# Patient Record
Sex: Male | Born: 1990 | Race: White | Hispanic: No | Marital: Single | Smoking: Current every day smoker
Health system: Southern US, Community
[De-identification: ages and names within clinical notes are randomized; demographics above are authoritative.]

## PROBLEM LIST (undated history)

## (undated) DIAGNOSIS — M51369 Other intervertebral disc degeneration, lumbar region without mention of lumbar back pain or lower extremity pain: Secondary | ICD-10-CM

## (undated) DIAGNOSIS — K259 Gastric ulcer, unspecified as acute or chronic, without hemorrhage or perforation: Secondary | ICD-10-CM

## (undated) DIAGNOSIS — IMO0002 Reserved for concepts with insufficient information to code with codable children: Secondary | ICD-10-CM

## (undated) DIAGNOSIS — C329 Malignant neoplasm of larynx, unspecified: Secondary | ICD-10-CM

## (undated) DIAGNOSIS — M5136 Other intervertebral disc degeneration, lumbar region: Secondary | ICD-10-CM

## (undated) HISTORY — PX: WISDOM TOOTH EXTRACTION: SHX21

---

## 2010-07-15 ENCOUNTER — Emergency Department (HOSPITAL_COMMUNITY)
Admission: EM | Admit: 2010-07-15 | Discharge: 2010-07-15 | Payer: Self-pay | Source: Home / Self Care | Admitting: Emergency Medicine

## 2011-01-24 ENCOUNTER — Emergency Department (HOSPITAL_COMMUNITY): Payer: BC Managed Care – PPO

## 2011-01-24 ENCOUNTER — Emergency Department (HOSPITAL_COMMUNITY)
Admission: EM | Admit: 2011-01-24 | Discharge: 2011-01-25 | Disposition: A | Discharge status: Home / Self Care | Payer: BC Managed Care – PPO | Attending: Emergency Medicine | Admitting: Emergency Medicine

## 2011-01-24 DIAGNOSIS — S91309A Unspecified open wound, unspecified foot, initial encounter: Secondary | ICD-10-CM | POA: Insufficient documentation

## 2011-01-24 DIAGNOSIS — F319 Bipolar disorder, unspecified: Secondary | ICD-10-CM | POA: Insufficient documentation

## 2011-01-24 DIAGNOSIS — M79609 Pain in unspecified limb: Secondary | ICD-10-CM | POA: Insufficient documentation

## 2011-01-24 DIAGNOSIS — W268XXA Contact with other sharp object(s), not elsewhere classified, initial encounter: Secondary | ICD-10-CM | POA: Insufficient documentation

## 2011-12-09 ENCOUNTER — Emergency Department (HOSPITAL_BASED_OUTPATIENT_CLINIC_OR_DEPARTMENT_OTHER)
Admission: EM | Admit: 2011-12-09 | Discharge: 2011-12-09 | Disposition: A | Discharge status: Home / Self Care | Payer: BC Managed Care – PPO | Attending: Emergency Medicine | Admitting: Emergency Medicine

## 2011-12-09 ENCOUNTER — Encounter (HOSPITAL_BASED_OUTPATIENT_CLINIC_OR_DEPARTMENT_OTHER): Payer: Self-pay | Admitting: *Deleted

## 2011-12-09 DIAGNOSIS — K006 Disturbances in tooth eruption: Secondary | ICD-10-CM | POA: Insufficient documentation

## 2011-12-09 DIAGNOSIS — K0889 Other specified disorders of teeth and supporting structures: Secondary | ICD-10-CM

## 2011-12-09 DIAGNOSIS — F17200 Nicotine dependence, unspecified, uncomplicated: Secondary | ICD-10-CM | POA: Insufficient documentation

## 2011-12-09 HISTORY — DX: Reserved for concepts with insufficient information to code with codable children: IMO0002

## 2011-12-09 MED ORDER — HYDROCODONE-ACETAMINOPHEN 5-325 MG PO TABS
ORAL_TABLET | ORAL | Status: AC
  Administered 2011-12-09: 2 via ORAL
  Filled 2011-12-09: qty 2

## 2011-12-09 MED ORDER — HYDROCODONE-ACETAMINOPHEN 5-325 MG PO TABS: 2.0000 | ORAL_TABLET | ORAL | Status: AC | PRN

## 2011-12-09 MED ORDER — HYDROCODONE-ACETAMINOPHEN 5-325 MG PO TABS
2.0000 | ORAL_TABLET | Freq: Once | ORAL | Status: AC
  Administered 2011-12-09: 2 via ORAL

## 2011-12-09 MED ORDER — PENICILLIN V POTASSIUM 500 MG PO TABS: 500.0000 mg | ORAL_TABLET | Freq: Four times a day (QID) | ORAL | Status: AC

## 2011-12-09 NOTE — ED Notes (Signed)
rx x 1 given for norco- pt informed of e-prescription for PCN at [facility]- pt has a ride at bedside

## 2011-12-09 NOTE — Discharge Instructions (Signed)
Dental Pain  A tooth ache may be caused by cavities (tooth decay). Cavities expose the nerve of the tooth to air and hot or cold temperatures. It may come from an infection or abscess (also called a boil or furuncle) around your tooth. It is also often caused by dental caries (tooth decay). This causes the pain you are having.  DIAGNOSIS   Your caregiver can diagnose this problem by exam.  TREATMENT   · If caused by an infection, it may be treated with medications which kill germs (antibiotics) and pain medications as prescribed by your caregiver. Take medications as directed.  · Only take over-the-counter or prescription medicines for pain, discomfort, or fever as directed by your caregiver.  · Whether the tooth ache today is caused by infection or dental disease, you should see your dentist as soon as possible for further care.  SEEK MEDICAL CARE IF:  The exam and treatment you received today has been provided on an emergency basis only. This is not a substitute for complete medical or dental care. If your problem worsens or new problems (symptoms) appear, and you are unable to meet with your dentist, call or return to this location.  SEEK IMMEDIATE MEDICAL CARE IF:   · You have a fever.  · You develop redness and swelling of your face, jaw, or neck.  · You are unable to open your mouth.  · You have severe pain uncontrolled by pain medicine.  MAKE SURE YOU:   · Understand these instructions.  · Will watch your condition.  · Will get help right away if you are not doing well or get worse.  Document Released: 06/18/2005 Document Revised: 06/07/2011 Document Reviewed: 02/04/2008  ExitCare® Patient Information ©2012 [employer].

## 2011-12-09 NOTE — ED Provider Notes (Signed)
History     CSN: [id number]  Arrival date & time 12/09/11  1703   First MD Initiated Contact with Patient 12/09/11 1736      Chief Complaint  Patient presents with  . Dental Pain    (Consider location/radiation/quality/duration/timing/severity/associated sxs/prior treatment) Patient is a 21 y.o. male presenting with tooth pain. The history is provided by the patient. No language interpreter was used.  Dental PainThe primary symptoms include mouth pain and oral lesions. The symptoms began 2 days ago. The symptoms are worsening. The symptoms are new. The symptoms occur constantly.  Additional symptoms include: gum swelling and gum tenderness.   Pt complains of pain from wisdom teeth coming in.   Pt reports pain right lower mouth.  No dentist Past Medical History  Diagnosis Date  . Herniated disc     History reviewed. No pertinent past surgical history.  No family history on file.  History  Substance Use Topics  . Smoking status: Current Everyday Smoker  . Smokeless tobacco: Never Used  . Alcohol Use: No      Review of Systems  HENT: Positive for dental problem.   All other systems reviewed and are negative.    Allergies  Wellbutrin  Home Medications   Current Outpatient Rx  Name Route Sig Dispense Refill  . HYDROCODONE-ACETAMINOPHEN 10-325 MG PO TABS Oral Take 1 tablet by mouth every 6 (six) hours as needed. Patient used this medication for pain.      BP 123/80  Pulse 101  Temp(Src) 99 F (37.2 C) (Oral)  Resp 20  Ht 5\' 10"  (1.778 m)  Wt 118 lb (53.524 kg)  BMI 16.93 kg/m2  SpO2 100%  Physical Exam  Nursing note and vitals reviewed. Constitutional: He appears well-developed and well-nourished.  HENT:  Head: Normocephalic and atraumatic.  Nose: Nose normal.  Mouth/Throat: Oropharynx is clear and moist.       Impacted lower wisdom tooth.    Eyes: Conjunctivae and EOM are normal. Pupils are equal, round, and reactive to light.  Neck: Normal range of  motion. Neck supple.  Pulmonary/Chest: Effort normal.  Abdominal: Soft. Bowel sounds are normal.  Neurological: He is alert.  Skin: Skin is warm.  Psychiatric: He has a normal mood and affect.    ED Course  Procedures (including critical care time)  Labs Reviewed - No data to display No results found.   No diagnosis found.    MDM  RX for pcn and hydrocodone.          Lonia Skinner [place], [place] 12/09/11 1757

## 2011-12-09 NOTE — ED Notes (Signed)
Right lower tooth pain x 3 days 

## 2011-12-12 NOTE — ED Provider Notes (Signed)
Medical screening examination/treatment/procedure(s) were performed by non-physician practitioner and as supervising physician I was immediately available for consultation/collaboration.  Kellen Hover, MD 12/12/11 1759 

## 2012-01-01 ENCOUNTER — Emergency Department (HOSPITAL_BASED_OUTPATIENT_CLINIC_OR_DEPARTMENT_OTHER)
Admission: EM | Admit: 2012-01-01 | Discharge: 2012-01-01 | Disposition: A | Discharge status: Home / Self Care | Payer: BC Managed Care – PPO | Attending: Emergency Medicine | Admitting: Emergency Medicine

## 2012-01-01 ENCOUNTER — Encounter (HOSPITAL_BASED_OUTPATIENT_CLINIC_OR_DEPARTMENT_OTHER): Payer: Self-pay

## 2012-01-01 DIAGNOSIS — F17200 Nicotine dependence, unspecified, uncomplicated: Secondary | ICD-10-CM | POA: Insufficient documentation

## 2012-01-01 DIAGNOSIS — M549 Dorsalgia, unspecified: Secondary | ICD-10-CM | POA: Insufficient documentation

## 2012-01-01 MED ORDER — KETOROLAC TROMETHAMINE 60 MG/2ML IM SOLN
60.0000 mg | Freq: Once | INTRAMUSCULAR | Status: AC
  Administered 2012-01-01: 60 mg via INTRAMUSCULAR
  Filled 2012-01-01: qty 2

## 2012-01-01 MED ORDER — HYDROMORPHONE HCL PF 2 MG/ML IJ SOLN
2.0000 mg | Freq: Once | INTRAMUSCULAR | Status: AC
  Administered 2012-01-01: 2 mg via INTRAMUSCULAR
  Filled 2012-01-01: qty 1

## 2012-01-01 MED ORDER — HYDROCODONE-ACETAMINOPHEN 5-325 MG PO TABS: 2.0000 | ORAL_TABLET | ORAL | Status: AC | PRN

## 2012-01-01 NOTE — ED Provider Notes (Signed)
History     CSN: [id number]  Arrival date & time 01/01/12  1750   First MD Initiated Contact with Patient 01/01/12 1901      Chief Complaint  Patient presents with  . Back Pain    (Consider location/radiation/quality/duration/timing/severity/associated sxs/prior treatment) Patient is a 21 y.o. male presenting with back pain. The history is provided by the patient. No language interpreter was used.  Back Pain  This is a recurrent problem. The current episode started yesterday. The problem occurs constantly. The problem has been gradually worsening. The pain is present in the thoracic spine. The quality of the pain is described as shooting and aching. The pain is moderate. He has tried nothing for the symptoms.  Pt complains of pain from a herniated disc in his back.   Pt reports he can not see his Md until friday  Past Medical History  Diagnosis Date  . Herniated disc     History reviewed. No pertinent past surgical history.  History reviewed. No pertinent family history.  History  Substance Use Topics  . Smoking status: Current Everyday Smoker  . Smokeless tobacco: Never Used  . Alcohol Use: No      Review of Systems  Musculoskeletal: Positive for back pain.  All other systems reviewed and are negative.    Allergies  Wellbutrin  Home Medications   Current Outpatient Rx  Name Route Sig Dispense Refill  . PRESCRIPTION MEDICATION Oral Take 1 tablet by mouth daily. Patient uses a corticosteroid tablet that doctor gave to him as a sample.    Marland Kitchen HYDROCODONE-ACETAMINOPHEN 5-325 MG PO TABS Oral Take 2 tablets by mouth every 4 (four) hours as needed for pain. 20 tablet 0    BP 145/96  Pulse 105  Temp 98.4 F (36.9 C) (Oral)  Resp 16  Ht 5\' 10"  (1.778 m)  Wt 120 lb (54.432 kg)  BMI 17.22 kg/m2  SpO2 100%  Physical Exam  Nursing note and vitals reviewed. Constitutional: He is oriented to person, place, and time. He appears well-developed and well-nourished.    HENT:  Head: Normocephalic.  Eyes: Pupils are equal, round, and reactive to light.  Neck: Normal range of motion.  Cardiovascular: Normal rate.   Pulmonary/Chest: Effort normal.  Abdominal: Soft.  Musculoskeletal: Normal range of motion.       Tender mid thoracic spine,    Neurological: He is alert and oriented to person, place, and time. He has normal reflexes.  Skin: Skin is warm.    ED Course  Procedures (including critical care time)  Labs Reviewed - No data to display No results found.   1. Back pain       MDM  Pt given hydrocodone.  i advised pt to see his Md for recheck on Friday as scheduled        Lonia Skinner [place], [place] 01/01/12 1950

## 2012-01-01 NOTE — ED Notes (Signed)
Pt c/o mid back pain. Hx chronic pain

## 2012-01-05 NOTE — ED Provider Notes (Signed)
Medical screening examination/treatment/procedure(s) were performed by non-physician practitioner and as supervising physician I was immediately available for consultation/collaboration.  Leyah Bocchino M Marua Qin, MD 01/05/12 1237 

## 2012-07-24 ENCOUNTER — Other Ambulatory Visit: Payer: Self-pay | Admitting: Family Medicine

## 2012-07-24 DIAGNOSIS — R109 Unspecified abdominal pain: Secondary | ICD-10-CM

## 2012-07-24 DIAGNOSIS — R112 Nausea with vomiting, unspecified: Secondary | ICD-10-CM

## 2012-07-28 ENCOUNTER — Ambulatory Visit
Admission: RE | Admit: 2012-07-28 | Discharge: 2012-07-28 | Disposition: A | Discharge status: Home / Self Care | Payer: BC Managed Care – PPO | Source: Ambulatory Visit | Attending: Family Medicine | Admitting: Family Medicine

## 2012-07-28 DIAGNOSIS — R109 Unspecified abdominal pain: Secondary | ICD-10-CM

## 2012-07-28 DIAGNOSIS — R112 Nausea with vomiting, unspecified: Secondary | ICD-10-CM

## 2012-08-12 ENCOUNTER — Other Ambulatory Visit: Payer: Self-pay | Admitting: Gastroenterology

## 2012-08-12 DIAGNOSIS — R112 Nausea with vomiting, unspecified: Secondary | ICD-10-CM

## 2012-08-18 ENCOUNTER — Ambulatory Visit
Admission: RE | Admit: 2012-08-18 | Discharge: 2012-08-18 | Disposition: A | Discharge status: Home / Self Care | Payer: BC Managed Care – PPO | Source: Ambulatory Visit | Attending: Gastroenterology | Admitting: Gastroenterology

## 2012-08-18 DIAGNOSIS — R112 Nausea with vomiting, unspecified: Secondary | ICD-10-CM

## 2013-09-30 ENCOUNTER — Emergency Department (HOSPITAL_BASED_OUTPATIENT_CLINIC_OR_DEPARTMENT_OTHER): Payer: Worker's Compensation

## 2013-09-30 ENCOUNTER — Encounter (HOSPITAL_BASED_OUTPATIENT_CLINIC_OR_DEPARTMENT_OTHER): Payer: Self-pay | Admitting: Emergency Medicine

## 2013-09-30 ENCOUNTER — Emergency Department (HOSPITAL_BASED_OUTPATIENT_CLINIC_OR_DEPARTMENT_OTHER)
Admission: EM | Admit: 2013-09-30 | Discharge: 2013-09-30 | Disposition: A | Discharge status: Home / Self Care | Payer: Worker's Compensation | Attending: Emergency Medicine | Admitting: Emergency Medicine

## 2013-09-30 DIAGNOSIS — W208XXA Other cause of strike by thrown, projected or falling object, initial encounter: Secondary | ICD-10-CM | POA: Insufficient documentation

## 2013-09-30 DIAGNOSIS — S1093XA Contusion of unspecified part of neck, initial encounter: Secondary | ICD-10-CM

## 2013-09-30 DIAGNOSIS — S060X9A Concussion with loss of consciousness of unspecified duration, initial encounter: Secondary | ICD-10-CM

## 2013-09-30 DIAGNOSIS — Z79899 Other long term (current) drug therapy: Secondary | ICD-10-CM | POA: Insufficient documentation

## 2013-09-30 DIAGNOSIS — Y9389 Activity, other specified: Secondary | ICD-10-CM | POA: Insufficient documentation

## 2013-09-30 DIAGNOSIS — S0083XA Contusion of other part of head, initial encounter: Secondary | ICD-10-CM

## 2013-09-30 DIAGNOSIS — Z8739 Personal history of other diseases of the musculoskeletal system and connective tissue: Secondary | ICD-10-CM | POA: Insufficient documentation

## 2013-09-30 DIAGNOSIS — S060XAA Concussion with loss of consciousness status unknown, initial encounter: Secondary | ICD-10-CM

## 2013-09-30 DIAGNOSIS — Y9269 Other specified industrial and construction area as the place of occurrence of the external cause: Secondary | ICD-10-CM | POA: Insufficient documentation

## 2013-09-30 DIAGNOSIS — S0003XA Contusion of scalp, initial encounter: Secondary | ICD-10-CM | POA: Insufficient documentation

## 2013-09-30 DIAGNOSIS — F17200 Nicotine dependence, unspecified, uncomplicated: Secondary | ICD-10-CM | POA: Insufficient documentation

## 2013-09-30 DIAGNOSIS — S060X0A Concussion without loss of consciousness, initial encounter: Secondary | ICD-10-CM | POA: Insufficient documentation

## 2013-09-30 MED ORDER — ONDANSETRON HCL 4 MG PO TABS: 4.0000 mg | ORAL_TABLET | Freq: Three times a day (TID) | ORAL | Status: DC | PRN

## 2013-09-30 NOTE — Discharge Instructions (Signed)

## 2013-09-30 NOTE — ED Provider Notes (Signed)
CSN: [id number]     Arrival date & time 09/30/13  1343 History   First MD Initiated Contact with Patient 09/30/13 1437     Chief Complaint  Patient presents with  . Head Injury     (Consider location/radiation/quality/duration/timing/severity/associated sxs/prior Treatment) Patient is a 23 y.o. male presenting with head injury.  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 He denies LOC but reports he was seeing all white for a short period. Has had headache and dizziness since then. No vomiting, no blurry vision. No confusion.  Past Medical History  Diagnosis Date  . Herniated disc    Past Surgical History  Procedure Laterality Date  . Wisdom tooth extraction     History reviewed. No pertinent family history. History  Substance Use Topics  . Smoking status: Current Every Day Smoker -- 2.00 packs/day  . Smokeless tobacco: Never Used  . Alcohol Use: No    Review of Systems All other systems reviewed and are negative except as noted in HPI.     Allergies  Wellbutrin  Home Medications   Current Outpatient Rx  Name  Route  Sig  Dispense  Refill  . METHADONE HCL PO   Oral   Take 130 mg by mouth daily.         Marland Kitchen PRESCRIPTION MEDICATION   Oral   Take 1 tablet by mouth daily. Patient uses a corticosteroid tablet that doctor gave to him as a sample.          BP 154/98  Pulse 89  Temp(Src) 98.4 F (36.9 C) (Oral)  Resp 16  Ht 5\' 9"  (1.753 m)  Wt 130 lb (58.968 kg)  BMI 19.19 kg/m2  SpO2 100% Physical Exam  Nursing note and vitals reviewed. Constitutional: He is oriented to person, place, and time. He appears well-developed and well-nourished.  HENT:  Head: Normocephalic and atraumatic.  Eyes: EOM are normal. Pupils are equal, round, and reactive to light.  Neck: Normal range of motion. Neck supple.  Cardiovascular: Normal rate, normal heart sounds and intact distal pulses.    Pulmonary/Chest: Effort normal and breath sounds normal.  Abdominal: Bowel sounds are normal. He exhibits no distension. There is no tenderness.  Musculoskeletal: Normal range of motion. He exhibits no edema and no tenderness.  Neurological: He is alert and oriented to person, place, and time. He has normal strength. No cranial nerve deficit or sensory deficit.  Skin: Skin is warm and dry. No rash noted.  Psychiatric: He has a normal mood and affect.    ED Course  Procedures (including critical care time) Labs Review Labs Reviewed - No data to display Imaging Review Ct Head Wo Contrast  09/30/2013   CLINICAL DATA:  Pain post trauma  EXAM: CT HEAD WITHOUT CONTRAST  TECHNIQUE: Contiguous axial images were obtained from the base of the skull through the vertex without intravenous contrast.  COMPARISON:  None.  FINDINGS: The ventricles are normal in size and configuration. The left lateral ventricle is slightly larger than the right lateral ventricle, an anatomic variant. There is no mass, hemorrhage, extra-axial fluid collection, or midline shift. Gray-white compartments are normal. Bony calvarium appears intact. The mastoid air cells are clear. There is mild mucosal thickening in several ethmoid air cells bilaterally. There is a small retention cyst in the superior right maxillary antrum.  IMPRESSION: Mild paranasal sinus disease.  Study otherwise unremarkable.  Electronically Signed   By: Lowella Grip M.D.   On: 09/30/2013 14:32     EKG Interpretation None      MDM   Final diagnoses:  Concussion    CT neg, given head injury/concussion precautions.     Charles B. Karle Starch, MD 09/30/13 1450

## 2013-09-30 NOTE — ED Notes (Addendum)
Pt sts electric drill fell 20 feet striking Left head, states episode of "whiteness", then 2 hours later, started having shaking hands. Pt sts headache, abrasion noted to left head. Also reports painful to close jaw. Also reports dizziness and nausea.

## 2013-09-30 NOTE — ED Notes (Signed)
Patient has bruise and a hematoma to the left side of his head due to being hit by a falling drill. Appx 2 inches in diameter.  Patient c/o a headache.

## 2014-11-19 ENCOUNTER — Emergency Department (HOSPITAL_COMMUNITY)
Admission: EM | Admit: 2014-11-19 | Discharge: 2014-11-19 | Disposition: A | Discharge status: Home / Self Care | Payer: BLUE CROSS/BLUE SHIELD | Attending: Emergency Medicine | Admitting: Emergency Medicine

## 2014-11-19 ENCOUNTER — Encounter (HOSPITAL_COMMUNITY): Payer: Self-pay | Admitting: Emergency Medicine

## 2014-11-19 DIAGNOSIS — Z8719 Personal history of other diseases of the digestive system: Secondary | ICD-10-CM | POA: Diagnosis not present

## 2014-11-19 DIAGNOSIS — S0591XA Unspecified injury of right eye and orbit, initial encounter: Secondary | ICD-10-CM | POA: Diagnosis present

## 2014-11-19 DIAGNOSIS — X58XXXA Exposure to other specified factors, initial encounter: Secondary | ICD-10-CM | POA: Diagnosis not present

## 2014-11-19 DIAGNOSIS — Z79891 Long term (current) use of opiate analgesic: Secondary | ICD-10-CM | POA: Insufficient documentation

## 2014-11-19 DIAGNOSIS — Y939 Activity, unspecified: Secondary | ICD-10-CM | POA: Insufficient documentation

## 2014-11-19 DIAGNOSIS — G8929 Other chronic pain: Secondary | ICD-10-CM | POA: Insufficient documentation

## 2014-11-19 DIAGNOSIS — Z720 Tobacco use: Secondary | ICD-10-CM | POA: Diagnosis not present

## 2014-11-19 DIAGNOSIS — S0501XA Injury of conjunctiva and corneal abrasion without foreign body, right eye, initial encounter: Secondary | ICD-10-CM | POA: Insufficient documentation

## 2014-11-19 DIAGNOSIS — Y929 Unspecified place or not applicable: Secondary | ICD-10-CM | POA: Insufficient documentation

## 2014-11-19 DIAGNOSIS — Y999 Unspecified external cause status: Secondary | ICD-10-CM | POA: Insufficient documentation

## 2014-11-19 MED ORDER — ERYTHROMYCIN 5 MG/GM OP OINT: TOPICAL_OINTMENT | OPHTHALMIC | Status: DC

## 2014-11-19 MED ORDER — FLUORESCEIN SODIUM 1 MG OP STRP
1.0000 | ORAL_STRIP | Freq: Once | OPHTHALMIC | Status: AC
  Administered 2014-11-19: 1 via OPHTHALMIC

## 2014-11-19 MED ORDER — FLUORESCEIN SODIUM 1 MG OP STRP
ORAL_STRIP | OPHTHALMIC | Status: AC
  Filled 2014-11-19: qty 1

## 2014-11-19 MED ORDER — TETRACAINE HCL 0.5 % OP SOLN
2.0000 [drp] | Freq: Once | OPHTHALMIC | Status: AC
  Administered 2014-11-19: 2 [drp] via OPHTHALMIC

## 2014-11-19 NOTE — ED Provider Notes (Signed)
TIME SEEN: 5:35 AM  CHIEF COMPLAINT: Right eye pain  HPI: Pt is a 24 y.o. male with history of chronic pain on methadone who presents to the emergency department with right eye pain that started yesterday at 9 PM after he was rubbing his eye. 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 He felt like he had something on his hand when he rubbed his eye last night. Is having some blurry vision and pain is worse with the light. Does not wear glasses or contacts. No other injury to the face or eye.  ROS: See HPI Constitutional: no fever  Eyes: no drainage  ENT: no runny nose   Cardiovascular:  no chest pain  Resp: no SOB  GI: no vomiting GU: no dysuria Integumentary: no rash  Allergy: no hives  Musculoskeletal: no leg swelling  Neurological: no slurred speech ROS otherwise negative  PAST MEDICAL HISTORY/PAST SURGICAL HISTORY:  Past Medical History  Diagnosis Date  . Herniated disc     MEDICATIONS:  Prior to Admission medications   Medication Sig Start Date End Date Taking? Authorizing Provider  METHADONE HCL PO Take 130 mg by mouth daily.    Historical Provider, MD  ondansetron (ZOFRAN) 4 MG tablet Take 1 tablet (4 mg total) by mouth every 8 (eight) hours as needed for nausea or vomiting. 09/30/13   Calvert Cantor, MD  PRESCRIPTION MEDICATION Take 1 tablet by mouth daily. Patient uses a corticosteroid tablet that doctor gave to him as a sample.    Historical Provider, MD    ALLERGIES:  Allergies  Allergen Reactions  . Wellbutrin [Bupropion] Swelling    SOCIAL HISTORY:  History  Substance Use Topics  . Smoking status: Current Every Day Smoker -- 2.00 packs/day  . Smokeless tobacco: Never Used  . Alcohol Use: No    FAMILY HISTORY: No family history on file.  EXAM: BP 126/75 mmHg  Pulse 65  Temp(Src) 97.9 F (36.6 C) (Oral)  Resp 16  Ht 5\' 9"  (1.753 m)  Wt 150 lb (68.04 kg)  BMI 22.14 kg/m2  SpO2 98% CONSTITUTIONAL: Alert  and oriented and responds appropriately to questions. Well-appearing; well-nourished HEAD: Normocephalic EYES: Conjunctivae clear, PERRL, patient has photophobia but when given tetracaine his blurry vision and photophobia resolved, patient has 3 punctate corneal abrasions to the inferior aspect of the right eye redness with no foreign body appreciated, extraocular movements intact, no hyphema, no subconjunctival hemorrhage, globe intact ENT: normal nose; no rhinorrhea; moist mucous membranes; pharynx without lesions noted NECK: Supple, no meningismus, no LAD  CARD: RRR; S1 and S2 appreciated; no murmurs, no clicks, no rubs, no gallops RESP: Normal chest excursion without splinting or tachypnea; breath sounds clear and equal bilaterally; no wheezes, no rhonchi, no rales, no hypoxia or respiratory distress, speaking full sentences ABD/GI: Normal bowel sounds; non-distended; soft, non-tender, no rebound, no guarding, no peritoneal signs BACK:  The back appears normal and is non-tender to palpation, there is no CVA tenderness EXT: Normal ROM in all joints; non-tender to palpation; no edema; normal capillary refill; no cyanosis, no calf tenderness or swelling    SKIN: Normal color for age and race; warm NEURO: Moves all extremities equally, sensation to light touch intact diffusely, cranial nerves II through XII intact PSYCH: The patient's mood and manner are appropriate. Grooming and personal hygiene are appropriate.  MEDICAL DECISION MAKING: Patient here with corneal abrasion. No sign of globe injury otherwise. Vision improved after tetracaine. He is on  methadone chronically. Will not discharge home with further narcotics. We'll discharge him with erythromycin ointment and ophthalmology follow up information. Discussed return precautions. He verbalizes understanding and is comfortable with plan.     Johnson, DO 11/19/14 [phone]

## 2014-11-19 NOTE — ED Notes (Signed)
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 Swelling/throbbing per patient. Light sensitive.

## 2014-11-19 NOTE — Discharge Instructions (Signed)
Corneal Abrasion °The cornea is the clear covering at the front and center of the eye. When looking at the colored portion of the eye (iris), you are looking through the cornea. This very thin tissue is made up of many layers. The surface layer is a single layer of cells (corneal epithelium) and is one of the most sensitive tissues in the body. If a scratch or injury causes the corneal epithelium to come off, it is called a corneal abrasion. If the injury extends to the tissues below the epithelium, the condition is called a corneal ulcer. °CAUSES  °· Scratches. °· Trauma. °· Foreign body in the eye. °Some people have recurrences of abrasions in the area of the original injury even after it has healed (recurrent erosion syndrome). Recurrent erosion syndrome generally improves and goes away with time. °SYMPTOMS  °· Eye pain. °· Difficulty or inability to keep the injured eye open. °· The eye becomes very sensitive to light. °· Recurrent erosions tend to happen suddenly, first thing in the morning, usually after waking up and opening the eye. °DIAGNOSIS  °Your health care provider can diagnose a corneal abrasion during an eye exam. Dye is usually placed in the eye using a drop or a small paper strip moistened by your tears. When the eye is examined with a special light, the abrasion shows up clearly because of the dye. °TREATMENT  °· Small abrasions may be treated with antibiotic drops or ointment alone. °· A pressure patch may be put over the eye. If this is done, follow your doctor's instructions for when to remove the patch. Do not drive or use machines while the eye patch is on. Judging distances is hard to do with a patch on. °If the abrasion becomes infected and spreads to the deeper tissues of the cornea, a corneal ulcer can result. This is serious because it can cause corneal scarring. Corneal scars interfere with light passing through the cornea and cause a loss of vision in the involved eye. °HOME CARE  INSTRUCTIONS °· Use medicine or ointment as directed. Only take over-the-counter or prescription medicines for pain, discomfort, or fever as directed by your health care provider. °· Do not drive or operate machinery if your eye is patched. Your ability to judge distances is impaired. °· If your health care provider has given you a follow-up appointment, it is very important to keep that appointment. Not keeping the appointment could result in a severe eye infection or permanent loss of vision. If there is any problem keeping the appointment, let your health care provider know. °SEEK MEDICAL CARE IF:  °· You have pain, light sensitivity, and a scratchy feeling in one eye or both eyes. °· Your pressure patch keeps loosening up, and you can blink your eye under the patch after treatment. °· Any kind of discharge develops from the eye after treatment or if the lids stick together in the morning. °· You have the same symptoms in the morning as you did with the original abrasion days, weeks, or months after the abrasion healed. °MAKE SURE YOU:  °· Understand these instructions. °· Will watch your condition. °· Will get help right away if you are not doing well or get worse. °Document Released: 06/15/2000 Document Revised: 06/23/2013 Document Reviewed: 02/23/2013 °ExitCare® Patient Information ©2015 [employer]. This information is not intended to replace advice given to you by your health care provider. Make sure you discuss any questions you have with your health care provider. ° °

## 2015-05-27 ENCOUNTER — Encounter (HOSPITAL_COMMUNITY): Payer: Self-pay

## 2015-05-27 ENCOUNTER — Emergency Department (HOSPITAL_COMMUNITY)
Admission: EM | Admit: 2015-05-27 | Discharge: 2015-05-27 | Disposition: A | Discharge status: Home / Self Care | Payer: BLUE CROSS/BLUE SHIELD | Attending: Emergency Medicine | Admitting: Emergency Medicine

## 2015-05-27 DIAGNOSIS — R195 Other fecal abnormalities: Secondary | ICD-10-CM | POA: Diagnosis not present

## 2015-05-27 DIAGNOSIS — F17200 Nicotine dependence, unspecified, uncomplicated: Secondary | ICD-10-CM | POA: Diagnosis not present

## 2015-05-27 DIAGNOSIS — R197 Diarrhea, unspecified: Secondary | ICD-10-CM | POA: Diagnosis present

## 2015-05-27 DIAGNOSIS — R110 Nausea: Secondary | ICD-10-CM | POA: Insufficient documentation

## 2015-05-27 NOTE — Discharge Instructions (Signed)
Stool Culture  WHY AM I HAVING THIS TEST?  A stool culture tests your stool (feces) for infections of the intestines that are caused by bacteria, a virus, or parasites. Your health care provider may order this test if you have a fever, abdominal pain, and diarrhea that lasts for more than a few days.  WHAT KIND OF SAMPLE IS TAKEN?  A stool sample is required for this test. You will collect the sample when you have a bowel movement.  WILL I NEED TO COLLECT SAMPLES AT HOME?  You will collect a sample of your stool at home. Your health care provider will give you instructions. Carefully follow them each time you collect a sample. Your health care provider will also give you all of the supplies that you need. For each sample that you collect, you may get:  · A small container. You may be given different colored containers. Each container may come with different instructions.  · Gloves that can be thrown away.  · A plastic bag.  When collecting the sample:  · Do not pour out the fluid that is in the container. This fluid will preserve your sample.  · Choose the parts of the sample that are bloody, slimy, or watery.  · If your stool is hard, choose samples from each end and the middle.  · Do not mix urine, toilet paper, or water with your sample.  You may be instructed to collect the sample in the following way:  · Before you collect the sample:    Cover the toilet bowl with plastic wrap or a plastic bag.    Tape the wrap or bag to the bowl of the toilet, not to the seat. Do not stretch the plastic tight across the bowl. Leave room for your stool to fall during your bowel movement.    Wash and dry any containers that you were given. Keep them in the bathroom.  · When you are ready to collect a sample:    Wash your hands. thoroughly with soap and water.    Put on the gloves that were provided to you.    Using the small shovel that is built into the top of the container, put small scoops of your stool into the container.  Fill the container up to the red line on the label.    Use the shovel to stir the stool sample into the liquid in the container if directed to do so by the instructions that came with the collection container.    Close the lid of the collection container tightly.    Shake the sample until it is well mixed.    On the label, write the date, time, and your initials.    Put the container in the plastic bag that was given to you.    Flush the rest of your stool down the toilet. Throw away the gloves.    Wash your hands thoroughly with soap and water.  · Store the sample using the instructions on the container that you used to collect the sample.  · Repeat this procedure at a later time if you need to collect another sample. Additional samples should be collected at different times.  · Return the sample or samples to your health care provider shortly after you collect them. Check the instructions about when they need to be returned.  HOW DO I PREPARE FOR THIS TEST?  If you are a woman and are menstruating, wait 3 days after the   end of your menstrual cycle before you collect a sample.   WHAT DO THE RESULTS MEAN?  It is your responsibility to obtain your test results. Ask the lab or department performing the test when and how you will get your results. Normal results include:  · Normal intestinal flora. This means that the bacteria and fungi that are present are typically found in your intestines and are present in normal amounts.  · No ova or parasite infestation. This means that there is no evidence of parasites in your intestines.  Abnormal results will specify the bacteria, virus, or parasite that is responsible for the infection.  Talk with your health care provider to discuss your results, treatment options, and if necessary, the need for more tests. Talk with your health care provider if you have any questions about your results.     This information is not intended to replace advice given to you by your health care  provider. Make sure you discuss any questions you have with your health care provider.     Document Released: 07/21/2010 Document Revised: 07/09/2014 Document Reviewed: 11/06/2013  [employer] Interactive Patient Education ©2016 [employer]
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
## 2015-05-27 NOTE — ED Notes (Signed)
Pt is requesting to not have blood drawn at this time.
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
## 2015-05-30 LAB — STOOL CULTURE

## 2015-05-31 LAB — OVA + PARASITE EXAM

## 2015-05-31 LAB — O&P RESULT

## 2015-06-13 ENCOUNTER — Encounter (HOSPITAL_BASED_OUTPATIENT_CLINIC_OR_DEPARTMENT_OTHER): Payer: Self-pay

## 2015-06-13 ENCOUNTER — Emergency Department (HOSPITAL_BASED_OUTPATIENT_CLINIC_OR_DEPARTMENT_OTHER)
Admission: EM | Admit: 2015-06-13 | Discharge: 2015-06-13 | Discharge status: Against Medical Advice | Payer: BLUE CROSS/BLUE SHIELD | Attending: Emergency Medicine | Admitting: Emergency Medicine

## 2015-06-13 ENCOUNTER — Emergency Department (HOSPITAL_COMMUNITY)
Admission: EM | Admit: 2015-06-13 | Discharge: 2015-06-13 | Discharge status: Against Medical Advice | Payer: BLUE CROSS/BLUE SHIELD | Attending: Emergency Medicine | Admitting: Emergency Medicine

## 2015-06-13 ENCOUNTER — Telehealth (HOSPITAL_BASED_OUTPATIENT_CLINIC_OR_DEPARTMENT_OTHER): Payer: Self-pay | Admitting: Emergency Medicine

## 2015-06-13 ENCOUNTER — Encounter (HOSPITAL_COMMUNITY): Payer: Self-pay | Admitting: Family Medicine

## 2015-06-13 DIAGNOSIS — R197 Diarrhea, unspecified: Secondary | ICD-10-CM | POA: Insufficient documentation

## 2015-06-13 DIAGNOSIS — F17200 Nicotine dependence, unspecified, uncomplicated: Secondary | ICD-10-CM | POA: Diagnosis not present

## 2015-06-13 DIAGNOSIS — K625 Hemorrhage of anus and rectum: Secondary | ICD-10-CM | POA: Insufficient documentation

## 2015-06-13 DIAGNOSIS — R112 Nausea with vomiting, unspecified: Secondary | ICD-10-CM | POA: Diagnosis not present

## 2015-06-13 HISTORY — DX: Other intervertebral disc degeneration, lumbar region without mention of lumbar back pain or lower extremity pain: M51.369

## 2015-06-13 HISTORY — DX: Other intervertebral disc degeneration, lumbar region: M51.36

## 2015-06-13 HISTORY — DX: Gastric ulcer, unspecified as acute or chronic, without hemorrhage or perforation: K25.9

## 2015-06-13 LAB — CBC
HCT: 38.9 % — ABNORMAL LOW (ref 39.0–52.0)
Hemoglobin: 13 g/dL (ref 13.0–17.0)
MCH: 30.7 pg (ref 26.0–34.0)
MCHC: 33.4 g/dL (ref 30.0–36.0)
MCV: 91.7 fL (ref 78.0–100.0)
PLATELETS: 204 10*3/uL (ref 150–400)
RBC: 4.24 MIL/uL (ref 4.22–5.81)
RDW: 12.5 % (ref 11.5–15.5)
WBC: 9 10*3/uL (ref 4.0–10.5)

## 2015-06-13 LAB — COMPREHENSIVE METABOLIC PANEL
ALT: 21 U/L (ref 17–63)
AST: 31 U/L (ref 15–41)
Albumin: 4.3 g/dL (ref 3.5–5.0)
Alkaline Phosphatase: 70 U/L (ref 38–126)
Anion gap: 7 (ref 5–15)
BILIRUBIN TOTAL: 0.5 mg/dL (ref 0.3–1.2)
BUN: 10 mg/dL (ref 6–20)
CO2: 30 mmol/L (ref 22–32)
CREATININE: 0.99 mg/dL (ref 0.61–1.24)
Calcium: 9.5 mg/dL (ref 8.9–10.3)
Chloride: 104 mmol/L (ref 101–111)
GFR calc Af Amer: >60 mL/min (ref >60)
Glucose, Bld: 111 mg/dL — ABNORMAL HIGH (ref 65–99)
Potassium: 3.8 mmol/L (ref 3.5–5.1)
Sodium: 141 mmol/L (ref 135–145)
TOTAL PROTEIN: 7.4 g/dL (ref 6.5–8.1)

## 2015-06-13 LAB — LIPASE, BLOOD: Lipase: 20 U/L (ref 11–51)

## 2015-06-13 NOTE — ED Notes (Signed)
Patient going to [facility] for further eval, was unable to get patient to stay

## 2015-06-13 NOTE — ED Notes (Signed)
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

## 2015-06-13 NOTE — ED Notes (Signed)
Nurse first-Pt talking on cell phone in ED WR-NAD

## 2015-06-13 NOTE — ED Notes (Signed)
Pt here for lower abd pain and rectal bleeding. sts also vomiting blood. sts LLQ pain. sts recently seen here with worms in stool.

## 2015-06-13 NOTE — ED Notes (Signed)
Received incoming call from mr. Giron asking if we have a long wait. Informed pt that wait times vary, pt was very loud and upset stating he was in er at [facility] and was leaving to come to [facility], because he was bleeding to death and they would not help him. Informed patient that I am sure they are monitoring his situation and would continue to monitor him. He informed me that his labs had been done and he needed an ultrasound completed. Informed pt we would evaluate and treat him as soon as possible. Pt then hung up phone

## 2015-06-29 ENCOUNTER — Emergency Department (HOSPITAL_COMMUNITY)
Admission: EM | Admit: 2015-06-29 | Discharge: 2015-06-30 | Disposition: A | Discharge status: Home / Self Care | Payer: BLUE CROSS/BLUE SHIELD | Attending: Emergency Medicine | Admitting: Emergency Medicine

## 2015-06-29 ENCOUNTER — Encounter (HOSPITAL_COMMUNITY): Payer: Self-pay | Admitting: *Deleted

## 2015-06-29 DIAGNOSIS — R200 Anesthesia of skin: Secondary | ICD-10-CM | POA: Diagnosis not present

## 2015-06-29 DIAGNOSIS — Z79891 Long term (current) use of opiate analgesic: Secondary | ICD-10-CM | POA: Insufficient documentation

## 2015-06-29 DIAGNOSIS — F1210 Cannabis abuse, uncomplicated: Secondary | ICD-10-CM | POA: Insufficient documentation

## 2015-06-29 DIAGNOSIS — F1310 Sedative, hypnotic or anxiolytic abuse, uncomplicated: Secondary | ICD-10-CM | POA: Insufficient documentation

## 2015-06-29 DIAGNOSIS — F1910 Other psychoactive substance abuse, uncomplicated: Secondary | ICD-10-CM

## 2015-06-29 DIAGNOSIS — F419 Anxiety disorder, unspecified: Secondary | ICD-10-CM | POA: Diagnosis not present

## 2015-06-29 DIAGNOSIS — Z8739 Personal history of other diseases of the musculoskeletal system and connective tissue: Secondary | ICD-10-CM | POA: Insufficient documentation

## 2015-06-29 DIAGNOSIS — R079 Chest pain, unspecified: Secondary | ICD-10-CM | POA: Diagnosis not present

## 2015-06-29 DIAGNOSIS — R42 Dizziness and giddiness: Secondary | ICD-10-CM | POA: Insufficient documentation

## 2015-06-29 DIAGNOSIS — R0602 Shortness of breath: Secondary | ICD-10-CM | POA: Insufficient documentation

## 2015-06-29 DIAGNOSIS — F17200 Nicotine dependence, unspecified, uncomplicated: Secondary | ICD-10-CM | POA: Insufficient documentation

## 2015-06-29 DIAGNOSIS — Z8719 Personal history of other diseases of the digestive system: Secondary | ICD-10-CM | POA: Diagnosis not present

## 2015-06-29 LAB — COMPREHENSIVE METABOLIC PANEL
ALBUMIN: 5 g/dL (ref 3.5–5.0)
ALT: 23 U/L (ref 17–63)
AST: 28 U/L (ref 15–41)
Alkaline Phosphatase: 75 U/L (ref 38–126)
Anion gap: 12 (ref 5–15)
BILIRUBIN TOTAL: 0.8 mg/dL (ref 0.3–1.2)
BUN: 9 mg/dL (ref 6–20)
CHLORIDE: 101 mmol/L (ref 101–111)
CO2: 26 mmol/L (ref 22–32)
Calcium: 10.1 mg/dL (ref 8.9–10.3)
Creatinine, Ser: 0.79 mg/dL (ref 0.61–1.24)
GFR calc Af Amer: >60 mL/min (ref >60)
GFR calc non Af Amer: >60 mL/min (ref >60)
GLUCOSE: 116 mg/dL — AB (ref 65–99)
POTASSIUM: 3.8 mmol/L (ref 3.5–5.1)
SODIUM: 139 mmol/L (ref 135–145)
TOTAL PROTEIN: 8.2 g/dL — AB (ref 6.5–8.1)

## 2015-06-29 LAB — CBC
HEMATOCRIT: 39.1 % (ref 39.0–52.0)
HEMOGLOBIN: 13.7 g/dL (ref 13.0–17.0)
MCH: 31 pg (ref 26.0–34.0)
MCHC: 35 g/dL (ref 30.0–36.0)
MCV: 88.5 fL (ref 78.0–100.0)
Platelets: 271 10*3/uL (ref 150–400)
RBC: 4.42 MIL/uL (ref 4.22–5.81)
RDW: 12.5 % (ref 11.5–15.5)
WBC: 12.5 10*3/uL — ABNORMAL HIGH (ref 4.0–10.5)

## 2015-06-29 LAB — I-STAT TROPONIN, ED: Troponin i, poc: 0 ng/mL (ref 0.00–0.08)

## 2015-06-29 LAB — ETHANOL: Alcohol, Ethyl (B): <5 mg/dL (ref <5)

## 2015-06-29 MED ORDER — ONDANSETRON 8 MG PO TBDP
8.0000 mg | ORAL_TABLET | Freq: Once | ORAL | Status: AC
  Administered 2015-06-29: 8 mg via ORAL
  Filled 2015-06-29: qty 1

## 2015-06-29 MED ORDER — ACETAMINOPHEN 500 MG PO TABS
1000.0000 mg | ORAL_TABLET | Freq: Once | ORAL | Status: AC
  Administered 2015-06-29: 1000 mg via ORAL
  Filled 2015-06-29: qty 2

## 2015-06-29 NOTE — ED Notes (Signed)
Patient given water to drink.  

## 2015-06-29 NOTE — ED Notes (Addendum)
Pt states he took what he believes to be 13 benzodiazapine drugs over the course of 1 month (he is unsure of the exact medication, states the pill is yellow) and states he is now going through withdrawal. Pt states he stopped taking the medication 4 days ago and "felt like I was having a heart attack". Pt states he took a half of the pill today to help with withdrawal symptoms. Pt states he is currently on methadone as well.

## 2015-06-29 NOTE — ED Provider Notes (Signed)
CSN: [id number]     Arrival date & time 06/29/15  2137 History  By signing my name below, I, Emmanuella Mensah, attest that this documentation has been prepared under the direction and in the presence of Deronda Christian, MD. Electronically Signed: Judithann Sauger, ED Scribe. 06/29/2015. 11:51 PM.    Chief Complaint  Patient presents with  . Drug Problem   Patient is a 24 y.o. male presenting with drug problem. The history is provided by the patient. No language interpreter was used.  Drug Problem This is a recurrent problem. The current episode started 12 to 24 hours ago. The problem has been gradually worsening. Associated symptoms include chest pain and shortness of breath. Nothing aggravates the symptoms. Nothing relieves the symptoms. He has tried nothing for the symptoms.   HPI Comments: David Andrews is a 24 y.o. male with a hx of gastric ulcer who presents to the Emergency Department complaining of gradually worsening multiple episodes of anxiety attacks onset today. Pt's mother reports associated nausea, crying, SOB, CP, numbness, and dizziness. He states that he had half a pill of a "yellow pill that are 25 years old" at 4 pm today. He denies that the the pills were prescribed by his PCP. He had his last Dramamine at 3:30 pm. Pt admits that he has been self medicating. No alleviating factors noted.    Past Medical History  Diagnosis Date  . Herniated disc   . Gastric ulcer   . DDD (degenerative disc disease), lumbar    Past Surgical History  Procedure Laterality Date  . Wisdom tooth extraction     No family history on file. Social History  Substance Use Topics  . Smoking status: Current Every Day Smoker -- 2.00 packs/day  . Smokeless tobacco: Never Used  . Alcohol Use: No    Review of Systems  Respiratory: Positive for shortness of breath.   Cardiovascular: Positive for chest pain.  Neurological: Positive for dizziness.  All other systems reviewed and are  negative.     Allergies  Wellbutrin  Home Medications   Prior to Admission medications   Medication Sig Start Date End Date Taking? Authorizing Provider  dimenhyDRINATE (DRAMAMINE) 50 MG tablet Take 50 mg by mouth every 8 (eight) hours as needed for nausea.   Yes Historical Provider, MD  methadone (DOLOPHINE) 10 MG/ML solution Take 160 mg by mouth daily.    Yes Historical Provider, MD   BP 140/100 mmHg  Pulse 103  Temp(Src) 98 F (36.7 C) (Oral)  Resp 16  SpO2 100% Physical Exam  Constitutional: He is oriented to person, place, and time. He appears well-developed and well-nourished. No distress.  HENT:  Head: Normocephalic and atraumatic.  Mouth/Throat: Oropharynx is clear and moist.  Eyes: Conjunctivae and EOM are normal. Pupils are equal, round, and reactive to light.  Neck: Normal range of motion. Neck supple. No tracheal deviation present.  Cardiovascular: Normal rate, regular rhythm and intact distal pulses.   Pulmonary/Chest: Effort normal. No respiratory distress. He has no wheezes. He has no rales.  Abdominal: Soft. Bowel sounds are normal. There is no tenderness. There is no rebound and no guarding.  Musculoskeletal: Normal range of motion.  Neurological: He is alert and oriented to person, place, and time. He has normal reflexes.  Skin: Skin is warm and dry.  Psychiatric: He has a normal mood and affect. His behavior is normal.  Nursing note and vitals reviewed.   ED Course  Procedures (including critical care time) DIAGNOSTIC STUDIES: Oxygen  Saturation is 100% on RA, normal by my interpretation.    COORDINATION OF CARE: 11:41 PM- Pt advised of plan for treatment and pt agrees. Recommended to go off his medications to deal with symptoms. Pt will receive IV fluids but requested water instead.    Labs Review Labs Reviewed  COMPREHENSIVE METABOLIC PANEL - Abnormal; Notable for the following:    Glucose, Bld 116 (*)    Total Protein 8.2 (*)    All other  components within normal limits  CBC - Abnormal; Notable for the following:    WBC 12.5 (*)    All other components within normal limits  ETHANOL  URINE RAPID DRUG SCREEN, HOSP PERFORMED  I-STAT TROPOININ, ED    Ylianna Almanzar, MD has personally reviewed and evaluated these lab results as part of her medical decision-making.   EKG Interpretation   Date/Time:  Wednesday June 29 2015 22:13:38 EST Ventricular Rate:  112 PR Interval:  121 QRS Duration: 81 QT Interval:  322 QTC Calculation: 439 R Axis:   76 Text Interpretation:  Sinus tachycardia Multiple premature complexes,  Confirmed by [facility]  MD, Elita Dame ([id number]) on 06/29/2015 11:21:59 PM      MDM   Final diagnoses:  None  PERC negative wells 0 highly doubt PE.  Symptoms consistent with panic.  Patient is self medicating  No indication for inpatient detox.  Stop marijuana and medication you are not prescribed.  Follow up with therapist.    I personally performed the services described in this documentation, which was scribed in my presence. The recorded information has been reviewed and is accurate.     Veatrice Kells, MD 06/30/15 [phone]

## 2015-06-30 ENCOUNTER — Encounter (HOSPITAL_COMMUNITY): Payer: Self-pay

## 2015-06-30 ENCOUNTER — Encounter (HOSPITAL_COMMUNITY): Payer: Self-pay | Admitting: Emergency Medicine

## 2015-06-30 ENCOUNTER — Observation Stay (HOSPITAL_COMMUNITY)
Admission: AD | Admit: 2015-06-30 | Discharge: 2015-07-01 | Disposition: A | Discharge status: Home / Self Care | Payer: BLUE CROSS/BLUE SHIELD | Attending: Psychiatry | Admitting: Psychiatry

## 2015-06-30 DIAGNOSIS — F1120 Opioid dependence, uncomplicated: Secondary | ICD-10-CM | POA: Diagnosis not present

## 2015-06-30 DIAGNOSIS — F411 Generalized anxiety disorder: Secondary | ICD-10-CM | POA: Diagnosis not present

## 2015-06-30 DIAGNOSIS — G8929 Other chronic pain: Secondary | ICD-10-CM | POA: Diagnosis not present

## 2015-06-30 DIAGNOSIS — Z8719 Personal history of other diseases of the digestive system: Secondary | ICD-10-CM | POA: Diagnosis not present

## 2015-06-30 DIAGNOSIS — M5136 Other intervertebral disc degeneration, lumbar region: Secondary | ICD-10-CM | POA: Diagnosis not present

## 2015-06-30 DIAGNOSIS — F1290 Cannabis use, unspecified, uncomplicated: Secondary | ICD-10-CM | POA: Insufficient documentation

## 2015-06-30 DIAGNOSIS — F17210 Nicotine dependence, cigarettes, uncomplicated: Secondary | ICD-10-CM | POA: Diagnosis not present

## 2015-06-30 LAB — RAPID URINE DRUG SCREEN, HOSP PERFORMED
AMPHETAMINES: NOT DETECTED
BARBITURATES: NOT DETECTED
BENZODIAZEPINES: POSITIVE — AB
COCAINE: NOT DETECTED
Opiates: NOT DETECTED
TETRAHYDROCANNABINOL: POSITIVE — AB

## 2015-06-30 MED ORDER — TRAZODONE HCL 50 MG PO TABS
50.0000 mg | ORAL_TABLET | Freq: Every evening | ORAL | Status: DC | PRN
  Administered 2015-06-30: 50 mg via ORAL
  Filled 2015-06-30: qty 1

## 2015-06-30 MED ORDER — NICOTINE 21 MG/24HR TD PT24
21.0000 mg | MEDICATED_PATCH | Freq: Every day | TRANSDERMAL | Status: DC
  Administered 2015-07-01: 21 mg via TRANSDERMAL
  Filled 2015-06-30: qty 1

## 2015-06-30 MED ORDER — METHADONE HCL 10 MG PO TABS
150.0000 mg | ORAL_TABLET | Freq: Every day | ORAL | Status: DC
  Administered 2015-07-01: 150 mg via ORAL
  Filled 2015-06-30 (×4): qty 15

## 2015-06-30 MED ORDER — GABAPENTIN 100 MG PO CAPS
100.0000 mg | ORAL_CAPSULE | Freq: Three times a day (TID) | ORAL | Status: DC
  Administered 2015-06-30 – 2015-07-01 (×3): 100 mg via ORAL
  Filled 2015-06-30 (×3): qty 1

## 2015-06-30 MED ORDER — NICOTINE 21 MG/24HR TD PT24
MEDICATED_PATCH | TRANSDERMAL | Status: AC
  Administered 2015-06-30: 21 mg
  Filled 2015-06-30: qty 1

## 2015-06-30 MED ORDER — MAGNESIUM HYDROXIDE 400 MG/5ML PO SUSP: 30.0000 mL | Freq: Every day | ORAL | Status: DC | PRN

## 2015-06-30 MED ORDER — ALUM & MAG HYDROXIDE-SIMETH 200-200-20 MG/5ML PO SUSP: 30.0000 mL | ORAL | Status: DC | PRN

## 2015-06-30 MED ORDER — ONDANSETRON HCL 4 MG PO TABS
4.0000 mg | ORAL_TABLET | Freq: Once | ORAL | Status: AC
  Administered 2015-06-30: 4 mg via ORAL

## 2015-06-30 MED ORDER — ONDANSETRON HCL 4 MG PO TABS
ORAL_TABLET | ORAL | Status: AC
  Filled 2015-06-30: qty 1

## 2015-06-30 MED ORDER — HYDROXYZINE HCL 25 MG PO TABS
25.0000 mg | ORAL_TABLET | Freq: Four times a day (QID) | ORAL | Status: DC | PRN
  Administered 2015-06-30 – 2015-07-01 (×3): 25 mg via ORAL
  Filled 2015-06-30 (×3): qty 1

## 2015-06-30 MED ORDER — ACETAMINOPHEN 325 MG PO TABS
650.0000 mg | ORAL_TABLET | Freq: Four times a day (QID) | ORAL | Status: DC | PRN
  Administered 2015-06-30 – 2015-07-01 (×2): 650 mg via ORAL
  Filled 2015-06-30 (×2): qty 2

## 2015-06-30 MED ORDER — ONDANSETRON 4 MG PO TBDP
4.0000 mg | ORAL_TABLET | Freq: Three times a day (TID) | ORAL | Status: DC | PRN
Start: 2015-06-30 — End: 2015-07-01
  Administered 2015-07-01 (×2): 4 mg via ORAL
  Filled 2015-06-30 (×2): qty 1

## 2015-06-30 NOTE — Progress Notes (Signed)
Patient has been continuously observed except during bathroom breaks

## 2015-06-30 NOTE — Discharge Instructions (Signed)
°Emergency Department Resource Guide °1) Find a Doctor and Pay Out of Pocket °Although you won't have to find out who is covered by your insurance plan, it is a good idea to ask around and get recommendations. You will then need to call the office and see if the doctor you have chosen will accept you as a new patient and what types of options they offer for patients who are self-pay. Some doctors offer discounts or will set up payment plans for their patients who do not have insurance, but you will need to ask so you aren't surprised when you get to your appointment. ° °2) Contact Your Local Health Department °Not all health departments have doctors that can see patients for sick visits, but many do, so it is worth a call to see if yours does. If you don't know where your local health department is, you can check in your phone book. The CDC also has a tool to help you locate your state's health department, and many state websites also have listings of all of their local health departments. ° °3) Find a Walk-in Clinic °If your illness is not likely to be very severe or complicated, you may want to try a walk in clinic. These are popping up all over the country in pharmacies, drugstores, and shopping centers. They're usually staffed by nurse practitioners or physician assistants that have been trained to treat common illnesses and complaints. They're usually fairly quick and inexpensive. However, if you have serious medical issues or chronic medical problems, these are probably not your best option. ° °No Primary Care Doctor: °- Call [facility] at  832-8000 - they can help you locate a primary care doctor that  accepts your insurance, provides certain services, etc. °- Physician Referral Service- 1-800-533-3463 ° °Chronic Pain Problems: °Organization         Address  Phone   Notes  °[facility]  (336) 297-2271 Patients need to be referred by their primary care doctor.  ° °Medication  Assistance: °Organization         Address  Phone   Notes  °[place] [facility] [address] °[place], [place] [address] (336) 641-8030 --Must be a resident of [place] °-- Must have NO insurance coverage whatsoever (no Medicaid/ Medicare, etc.) °-- The pt. MUST have a primary care doctor that directs their care regularly and follows them in the community °  °[facility]  (866) 331-1348   °[employer]  (888) 892-1162   ° °Agencies that provide inexpensive medical care: °Organization         Address  Phone   Notes  °[facility]  (336) 832-8035   °[facility]    (336) 832-7272   °[facility] [address] °[place],  [address] (336) 832-4777   °[facility] [address], °[place] (336) 271-4999   °[facility]    (336) 373-0678   °[facility]    (336) 272-1050   °[facility] ° [address], [place] Phone:  (336) 832-4444, Fax:  (336) 832-4440 Hours of Operation:  9 am - 6 pm, M-F.  Also accepts Medicaid/Medicare and self-pay.  °[facility] ° [address], [place] Phone: (336) [phone], Fax: (336) [phone]. Hours of Operation:  8:30 am - 5:30 pm, M-F.  Also accepts Medicaid and self-pay.  °[facility] [place] [address], [place] Phone: (336) 878-6027   °[facility] [address], [place], [place] (336)723-1848, Ext. 123 Mondays & Thursdays: 7-9 AM.  First 15 patients are seen on a first come, first serve basis. °  ° °Medicaid-accepting [place] Providers: ° °Organization         Address  Phone   Notes  °[facility] [address], [place] (336) 641-2100 Also accepts self-pay patients.  °[facility] [address], [place] ° (336) 856-9996   °[facility] [address], [place]  (336) 288-8857   °[facility] [address] [place] [address], [place] (336) 299-7000   °Veita Bland [address], [place]  ° (336) 373-1557 Only accepts [place] Access Medicaid patients after they have their name applied to their card.  ° °Self-Pay (no insurance) in [place]: ° °Organization         Address  Phone   Notes  °Sickle Cell Patients, [facility] [address], [place] (336) 832-1970   °[facility] [address], [place] (336) 832-4400   °[facility] [place] ° [address], [place] (336) 992-4800   °[facility]/Dr. Osei-Bonsu ° [address] [place] [address], [place] or [address], [place] (336) 841-8500 Phone number for both [place] and [place] locations is the same.  °[facility] [address], [place] (336) 299-0000   °[facility] [address] [place] [address], [place] or [address] (336) 852-7530 °(336) 878-2260   °[facility] [address], [place] (336) 350-1642, phone; (336) 294-5005, fax Sees patients 1st and 3rd Saturday of every month.  Must not qualify for public or private insurance (i.e. Medicaid, Medicare, [place] Health Choice, Veterans' Benefits) • Household income should be no more than 200% of the poverty level •The clinic cannot treat you if you are pregnant or think you are pregnant • Sexually transmitted diseases are not treated at the clinic.  ° ° °Dental Care: °Organization         Address  Phone  Notes  °[place] [facility] [address], [place] (336) 641-6152 Accepts children up to age 21 who are enrolled in Medicaid or [place] Health Choice; pregnant women with a Medicaid card; and children who have applied for Medicaid or [place] Health Choice, but were declined, whose parents can pay a reduced fee at time of service.  °[place]  [facility] [place]  [address], [place] (336) 641-7733 Accepts children up to age 21 who are enrolled in Medicaid or [place] Health Choice; pregnant women with a Medicaid card; and children who have applied for Medicaid or  Health Choice, but were declined, whose parents can pay a reduced fee at time of service.  °[facility] ° [address],  (336) 641-4533 Patients are seen by appointment only. Walk-ins are not accepted. [facility] will see patients 18 years of age and older. °Monday - Tuesday (8am-5pm) °Most Wednesdays (8:30-5pm) °$30 per visit, cash only  °[facility] ° [address], [place] (336) 641-4533 Patients are seen by appointment only. Walk-ins are not accepted. [facility] will see patients 18 years of age and older. °One   Wednesday Evening (Monthly: Volunteer Based).  $30 per visit, cash only  °[facility]  (919) 537-3737 for adults; Children under age 4, call Graduate Pediatric Dentistry at ([phone]) [phone]. Children aged 4-14, please call ([phone]) [phone] to request a pediatric application. ° Dental services are provided in all areas of dental care including fillings, crowns and bridges, complete and partial dentures, implants, gum treatment, root canals, and extractions. Preventive care is also provided. Treatment is provided to both adults and children. °Patients are selected via a lottery and there is often a waiting list. °  °[facility] [address], °[place] ° (336) 763-8833 [email or web address] °  °[facility] [address], [place], [place] (336)723-1848, Ext. 123 Second and Fourth Thursday of each month, opens at 6:30 AM; Clinic ends at 9 AM.  Patients are seen on a first-come first-served basis, and a limited number are seen during each clinic.  ° °[facility] ° [address], [place], [place] (336) 723-7904    Eligibility Requirements °You must have lived in [place], [place], or [place] counties for at least the last three months. °  You cannot be eligible for state or federal sponsored healthcare insurance, including Veterans Administration, Medicaid, or Medicare. °  You generally cannot be eligible for healthcare insurance through your employer.  °  How to apply: °Eligibility screenings are held every Tuesday and Wednesday afternoon from 1:00 pm until 4:00 pm. You do not need an appointment for the interview!  °[facility] [address], [place], [place] 336-631-2330   °[facility]  336-342-8273   °[facility]  336-703-3100   °[facility]  336-570-6415   ° °Behavioral Health Resources in the Community: °Intensive Outpatient Programs °Organization         Address  Phone  Notes  °[facility] [address], [place], [place] 336-878-6098   °[facility] [address], [place], [place] 336-832-9800   °[facility] [address], [place], [place] ° 336-882-2125   °[facility] [address],  °[place], [place] 1-800-853-5163 or 336-641-4981   °Substance Abuse Resources °Organization         Address  Phone  Notes  °[facility]  336-882-2125   °[facility]  336-784-9470   °[facility] [place] [facility]  336-285-9073   °[facility]  336-845-3988   °Residential & Outpatient Substance Abuse Program  1-800-659-3381   °Psychological Services °Organization         Address  Phone  Notes  °[facility]  336- 832-9600   °[facility]  336- 378-7881   °[facility] [address], [place] 1-800-853-5163 or 336-641-4981   ° °Mobile Crisis Teams °Organization         Address  Phone  Notes  °[facility], Mobile Crisis Care Unit  1-877-626-1772   °Assertive °[facility] ° [address]  [place], [place] 336-834-9664   °Sharon DeEsch [address] °[place] 336-554-5454   ° °Self-Help/Support Groups °Organization         Address  Phone             Notes  °[facility] - variety of support groups  336- 373-1402 Call for more information  °Narcotics Anonymous (NA), [facility] [address], °[place]   2 meetings at this location  ° °  Residential Treatment Programs °Organization         Address  Phone  Notes  °[facility] [address],    °[place]  1-866-801-8205   °[facility] ° [address], [place], [place] 704-293-8524   °[facility] [address], [place] 336-845-3988 Admissions: 8am-3pm M-F  °[facility] [address]    °[place], [place] 336-841-1104   °[facility] [address], [place], [place] 336-379-7146   °[facility] [place] [facility] [address]  °[place], [place] 336-285-9073   °[facility] - Intensive Outpatient [address], [place], [place] 336-852-3033   °[facility] [address]  °[place], [place] 1-877-615-2722 or 336-784-9470   °[facility] [address] [place], [place] 336-227-7417 Accepts Medicaid  °[facility] [address]  °[place] 1-800-659-3381 Substance Abuse/Addiction Treatment  ° °[place] Behavioral Health Resources °Organization         Address  Phone  Notes  °[facility]  (888) 581-9988   °Julie Brannon, PhD [address] [place], [place]   (336) 349-5553 or (336) 951-0000   ° Behavioral   [address] °[place], [place] (336) 349-4454   °[facility] [address], [place], [place] (336) 342-8316 Insurance/Medicaid/sponsorship through [facility]  °[facility] [address]                                    [place], [place] (336) 342-8316 Therapy/tele-psych/case    °[facility] [address]  ° [place], [place] (336) 349-2233    °Dr. Arfeen  (336) 349-4544   °[facility] [place]  [employer] [place] [facility] 1) [address], [place] °2) [address], [place] °3)  [address], [place] (336) 349-3220 °(336) 342-7768 ° °(336) 342-8140   °[place] [facility] (336) 342-1394 or (336) 342-3537 (After Hours)    ° ° °

## 2015-06-30 NOTE — H&P (Signed)
[facility] Admission Assessment Adult  Patient Identification: David Andrews MRN:  [id number] Date of Evaluation:  06/30/2015 Chief Complaint:  "I have been self medicating to deal with my severe anxiety." Principal Diagnosis: GAD (generalized anxiety disorder) Diagnosis:   Patient Active Problem List   Diagnosis Date Noted  . GAD (generalized anxiety disorder) [F41.1] 06/30/2015   History of Present Illness::   David Andrews is an 24 y.o. male who presents voluntarily as a [facility] walk-in for c/o heightened anxiety. Patient received daily methadone dosing at [facility] for chronic pain and history of opiate addiction. The patient reported that his anxiety has been worsening for the past several weeks due to numerous psychosocial stressors with a history of anxiety disorder. Ferguson was seen at the [facility] last night with severe anxiety and discharged home with outpatient follow up. The patient stated "I have a lot going on. 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 I feel trapped having to get Methadone for three years. I can't go anywhere because I am tied to it. I have been getting valium off the streets and using that for two weeks. It's been five days since I have had any. I think that caused my symptoms to get worse. They also lowered my Methadone dosage from 160 mg to 150 mg because marijuana showed upon on a drug screen. I am having body aches so I think that might be why. I know medication will not solve all my problems but I need some help. I also have chronic problems with my girlfriend. Sometimes she does nothing around the house and does not work for periods of time. I've been on Bipolar medications before. I get angry and irritable sometimes. 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 I've just been so anxious. I'm a little better now that my girlfriend is gone because she really stresses me out." Patient was cooperative with assessment and appeared  calmer after taking a prn dose of vistaril. He mentioned that his Methadone Clinic MD had recommended that Neurontin be considered due to his history of chronic back pain. The patient expressed interest in being started on non addictive medication option. He denies any suicidal or homicidal ideation. Patient does not provide a history that describes a manic episode that meet criteria for Bipolar Disorder. His symptoms and presentation suggest an anxiety disorder exacerbated by situational stressors. Patient's urine drug screen is positive for benzo and marijuana. The patient reports using valium for two weeks but has not had any in five days.   Associated Signs/Symptoms: Depression Symptoms:  depressed mood, anhedonia, fatigue, difficulty concentrating, hopelessness, anxiety, panic attacks, loss of energy/fatigue, disturbed sleep, (Hypo) Manic Symptoms:  Irritable Mood, Anxiety Symptoms:  Excessive Worry, Panic Symptoms, Psychotic Symptoms:  Denies PTSD Symptoms: Negative Total Time spent with patient: 45 minutes  Past Psychiatric History: Anxiety Disorder  Risk to Self: Suicidal Ideation: No Suicidal Intent: No Is patient at risk for suicide?: No Suicidal Plan?: No Access to Means: No What has been your use of drugs/alcohol within the last 12 months?: see above Intentional Self Injurious Behavior: None Risk to Others: Homicidal Ideation: No Thoughts of Harm to Others: No Current Homicidal Intent: No Current Homicidal Plan: No Access to Homicidal Means: No History of harm to others?: No Assessment of Violence: None Noted Does patient have access to weapons?: No Criminal Charges Pending?: No Does patient have a court date: No Prior Inpatient Therapy: Prior Inpatient Therapy: Yes  Prior Therapy Dates: several times ; last time was 5 yrs ago Prior Therapy Facilty/Provider(s): unknown Reason for Treatment: drug abuse; anxiety Prior Outpatient Therapy: Prior Outpatient Therapy:  No Does patient have an ACCT team?: No Does patient have Intensive In-House Services?  : No Does patient have [facility] services? : No Does patient have P4CC services?: No  Alcohol Screening: 1. How often do you have a drink containing alcohol?: Never 9. Have you or someone else been injured as a result of your drinking?: No 10. Has a relative or friend or a doctor or another health worker been concerned about your drinking or suggested you cut down?: No Alcohol Use Disorder Identification Test Final Score (AUDIT): 0 Substance Abuse History in the last 12 months:  Yes.   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 Herniated disc   . Gastric ulcer   . DDD (degenerative disc disease), lumbar     Past Surgical History  Procedure Laterality Date  . Wisdom tooth extraction     Family History:  Family History  Problem Relation Age of Onset  . Drug abuse Father     Social History:  History  Alcohol Use No     History  Drug Use  . Yes  . Special: Marijuana, "Crack" cocaine, Cocaine    Comment: Smokes cannabis occasionally; started snorting coke at age 48 when his father introduced him to it. 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; abstinent since then    Social History   Social History  . Marital Status: Single    Spouse Name: N/A  . Number of Children: N/A  . Years of Education: N/A   Social History Main Topics  . Smoking status: Current Every Day Smoker -- 2.00 packs/day    Types: Cigarettes  . Smokeless tobacco: Never Used  . Alcohol Use: No  . Drug Use: Yes    Special: Marijuana, "Crack" cocaine, Cocaine      Comment: Smokes cannabis occasionally; started snorting coke at age 63 when his father introduced him to it. 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; abstinent since then  . Sexual Activity: Yes   Other Topics Concern  . None   Social History Narrative   Additional Social History:    Pain Medications: see MAR Prescriptions: see MAR Over the Counter: see MAR History of alcohol / drug use?: Yes Longest period of sobriety (when/how long): _0  years Withdrawal Symptoms:  (unknown) Name of Substance 1: IV cocaine 1 - Age of First Use: unknown 1 - Amount (size/oz): unknown 1 - Frequency: unknown 1 - Duration: unknown 1 - Last Use / Amount: 3 years ago                  Allergies:   Allergies  Allergen Reactions  . Wellbutrin [Bupropion] Swelling    "makes him crazy"   Lab Results:  Results for orders placed or performed during the hospital encounter of 06/29/15 (from the past 48 hour(s))  Comprehensive metabolic panel     Status: Abnormal   Collection Time: 06/29/15 10:14 PM  Result Value Ref Range   Sodium 139  135 - 145 mmol/L   Potassium 3.8 3.5 - 5.1 mmol/L   Chloride 101 101 - 111 mmol/L   CO2 26 22 - 32 mmol/L   Glucose, Bld 116 (H) 65 - 99 mg/dL   BUN 9 6 - 20 mg/dL   Creatinine, Ser 0.79 0.61 - 1.24 mg/dL   Calcium 10.1 8.9 - 10.3 mg/dL   Total Protein 8.2 (H) 6.5 - 8.1 g/dL   Albumin 5.0 3.5 - 5.0 g/dL   AST 28 15 - 41 U/L   ALT 23 17 - 63 U/L   Alkaline Phosphatase 75 38 - 126 U/L   Total Bilirubin 0.8 0.3 - 1.2 mg/dL   GFR calc non Af Amer >60 >60 mL/min   GFR calc Af Amer >60 >60 mL/min    Comment: (NOTE) The eGFR has been calculated using the CKD EPI equation. This calculation has not been validated in all clinical situations. eGFR's persistently <60 mL/min signify possible Chronic Kidney Disease.    Anion gap 12 5 - 15  Ethanol (ETOH)     Status: None   Collection Time: 06/29/15 10:14  PM  Result Value Ref Range   Alcohol, Ethyl (B) <5 <5 mg/dL    Comment:        LOWEST DETECTABLE LIMIT FOR SERUM ALCOHOL IS 5 mg/dL FOR MEDICAL PURPOSES ONLY   CBC     Status: Abnormal   Collection Time: 06/29/15 10:14 PM  Result Value Ref Range   WBC 12.5 (H) 4.0 - 10.5 K/uL   RBC 4.42 4.22 - 5.81 MIL/uL   Hemoglobin 13.7 13.0 - 17.0 g/dL   HCT 39.1 39.0 - 52.0 %   MCV 88.5 78.0 - 100.0 fL   MCH 31.0 26.0 - 34.0 pg   MCHC 35.0 30.0 - 36.0 g/dL   RDW 12.5 11.5 - 15.5 %   Platelets 271 150 - 400 K/uL  I-stat troponin, ED (not at [facility], [facility])     Status: None   Collection Time: 06/29/15 10:19 PM  Result Value Ref Range   Troponin i, poc 0.00 0.00 - 0.08 ng/mL   Comment 3            Comment: Due to the release kinetics of cTnI, a negative result within the first hours of the onset of symptoms does not rule out myocardial infarction with certainty. If myocardial infarction is still suspected, repeat the test at appropriate intervals.   Urine rapid drug screen (hosp performed) (Not at [facility])     Status: Abnormal   Collection Time: 06/29/15 11:10 PM  Result Value Ref Range   Opiates NONE DETECTED NONE DETECTED   Cocaine NONE DETECTED NONE DETECTED   Benzodiazepines POSITIVE (A) NONE DETECTED   Amphetamines NONE DETECTED NONE DETECTED   Tetrahydrocannabinol POSITIVE (A) NONE DETECTED   Barbiturates NONE DETECTED NONE DETECTED    Comment:        DRUG SCREEN FOR MEDICAL PURPOSES ONLY.  IF CONFIRMATION IS NEEDED FOR ANY PURPOSE, NOTIFY LAB WITHIN 5 DAYS.        LOWEST DETECTABLE LIMITS FOR URINE DRUG SCREEN Drug Class       Cutoff (ng/mL) Amphetamine      1000 Barbiturate      200 Benzodiazepine   193 Tricyclics       790 Opiates          300 Cocaine          300 THC  50     Metabolic Disorder Labs:  No results found for: HGBA1C, MPG No results found for: PROLACTIN No results found for: CHOL, TRIG, HDL, CHOLHDL, VLDL, LDLCALC  Current  Medications: Current Facility-Administered Medications  Medication Dose Route Frequency Provider Last Rate Last Dose  . acetaminophen (TYLENOL) tablet 650 mg  650 mg Oral Q6H PRN Niel Hummer, NP      . alum & mag hydroxide-simeth (MAALOX/MYLANTA) 200-200-20 MG/5ML suspension 30 mL  30 mL Oral Q4H PRN Niel Hummer, NP      . gabapentin (NEURONTIN) capsule 100 mg  100 mg Oral TID Niel Hummer, NP      . hydrOXYzine (ATARAX/VISTARIL) tablet 25 mg  25 mg Oral Q6H PRN Niel Hummer, NP   25 mg at 06/30/15 1430  . magnesium hydroxide (MILK OF MAGNESIA) suspension 30 mL  30 mL Oral Daily PRN Niel Hummer, NP      . Derrill Memo ON 07/01/2015] methadone (DOLOPHINE) tablet 150 mg  150 mg Oral Daily Niel Hummer, NP      . Derrill Memo ON 07/01/2015] nicotine (NICODERM CQ - dosed in mg/24 hours) patch 21 mg  21 mg Transdermal Q0600 Niel Hummer, NP      . ondansetron (ZOFRAN) 4 MG tablet           . traZODone (DESYREL) tablet 50 mg  50 mg Oral QHS PRN Niel Hummer, NP       PTA Medications: Prescriptions prior to admission  Medication Sig Dispense Refill Last Dose  . dimenhyDRINATE (DRAMAMINE) 50 MG tablet Take 50 mg by mouth every 8 (eight) hours as needed for nausea.   06/29/2015 at Unknown time  . methadone (DOLOPHINE) 10 MG/ML solution Take 160 mg by mouth daily.    06/29/2015 at Unknown time    Musculoskeletal: Strength & Muscle Tone: within normal limits Gait & Station: normal Patient leans: N/A  Psychiatric Specialty Exam: Physical Exam  Review of Systems  Constitutional: Negative.   HENT: Negative.   Eyes: Negative.   Respiratory: Negative.   Cardiovascular: Negative.   Gastrointestinal: Positive for nausea.  Genitourinary: Negative.   Musculoskeletal: Positive for back pain.  Skin: Negative.   Neurological: Negative.   Endo/Heme/Allergies: Negative.   Psychiatric/Behavioral: Positive for depression and substance abuse. Negative for suicidal ideas, hallucinations and memory loss. The  patient is nervous/anxious. The patient does not have insomnia.     Blood pressure 139/97, pulse 70, temperature 98.6 F (37 C), temperature source Oral, resp. rate 24, height 5' 7.5" (1.715 m), weight 65.091 kg (143 lb 8 oz), SpO2 100 %.Body mass index is 22.13 kg/(m^2).  General Appearance: Disheveled  Eye Sport and exercise psychologist::  Fair  Speech:  Clear and Coherent  Volume:  Normal  Mood:  Anxious  Affect:  Congruent  Thought Process:  Goal Directed and Intact  Orientation:  Full (Time, Place, and Person)  Thought Content:  Symptoms, worries, concerns   Suicidal Thoughts:  No  Homicidal Thoughts:  No  Memory:  Immediate;   Good Recent;   Good Remote;   Good  Judgement:  Poor  Insight:  Present  Psychomotor Activity:  Restlessness  Concentration:  Good  Recall:  Good  Fund of Knowledge:Good  Language: Good  Akathisia:  No  Handed:  Right  AIMS (if indicated):     Assets:  Communication Skills Desire for Improvement Financial Resources/Insurance Housing Intimacy Leisure Time Physical Health Resilience Talents/Skills  ADL's:  Intact  Cognition: WNL  Sleep:  Treatment Plan Summary: Daily contact with patient to assess and evaluate symptoms and progress in treatment and Medication management  Observation Level/Precautions:  Continuous Observation  Laboratory:  CBC Chemistry Profile UDS  Psychotherapy:  Individual   Medications:  Start Neurontin 100 mg TID for mood control/anxiety, vistaril 25 mg every six hours prn anxiety, Zofran 4 mg ODT prn nausea, Trazodone 50 mg hs prn insomnia   Consultations:  As needed  Discharge Concerns:  Severe anxiety, continued substance abuse   Estimated LOS: Less then 48 hours  Other:     DAVIS, Mickel Baas, NP-C 12/29/20164:53 PM Agree with NP Assessment and  Note

## 2015-06-30 NOTE — Progress Notes (Signed)
David Andrews 24 yo WM.  Patient admitted Voluntary status as a walk in with Dx of Generalized Anxiety Disorder. Patient is cooperative, polite, denies SI/HI/AVH but is extremely anxious. Pt has taken 2 showers to help him with his anxiety.  Patient denies any alcohol consumption and admits to occasional cannabis smoking. Pt sts a great deal of his anxiety stems from his relationship with his girl friend and he constant complaints of his working too much and nevery being around. Pt and GF  have a 25 year old who has had several operations. Patient oriented to the Unit, Nurse ensuring constant visual supervision of patient except for when he is in the bathroom.

## 2015-06-30 NOTE — ED Notes (Signed)
MD at bedside. 

## 2015-06-30 NOTE — BH Assessment (Addendum)
Assessment Note  David Andrews is an 24 y.o. male who presents voluntarily as a [facility] walk-in for c/o heightened anxiety. Pt was oriented x 4. His mood and affect were anxious. His speech was pressured. Pt was doubled over as if in pain with his eyes closed for the majority of the assessment. Accompanying pt was his girlfriend and child's mother, Loyola Mast. Pt stated that he receives daily methadone treatment at [facility] due to chronic pain and a hx of drug abuse. Pt states that 3-4 days ago, he evolved into a constant state of anxiety. He stated that he can hardly eat or sleep, has ringing ears, lightheadedness, burning in his neck and chest and nausea. Pt stated that he has a  Hx of anxiety, depression, and drug abuse and has been hospitalized 7 times in the past for treatment of such. Pt shared that after presenting to [facility] this morning for his methadone treatment in his heightened anxiety state, he was referred to [facility] who did not have a doctor to see him and they referred him to [facility]. Pt denies SI/HI/AVH.   Pt signed a ROI for [facility] and counselor called Yevonne Aline at [place] [phone]) and ver'd that pt is taking 150mg  of Methadone/day and rec'd his last dose this morning.   Diagnosis: Generalized Anxiety Disorder  Past Medical History:  Past Medical History  Diagnosis Date  . Herniated disc   . Gastric ulcer   . DDD (degenerative disc disease), lumbar     Past Surgical History  Procedure Laterality Date  . Wisdom tooth extraction      Family History: No family history on file.  Social History:  reports that he has been smoking.  He has never used smokeless tobacco. He reports that he uses illicit drugs (Marijuana). He reports that he does not drink alcohol.  Additional Social History:  Alcohol / Drug Use Pain Medications: see MAR Prescriptions: see MAR Over the Counter: see MAR History of alcohol / drug use?: Yes Longest period of sobriety  (when/how long): @3  years Withdrawal Symptoms:  (unknown) Substance #1 Name of Substance 1: IV cocaine 1 - Age of First Use: unknown 1 - Amount (size/oz): unknown 1 - Frequency: unknown 1 - Duration: unknown 1 - Last Use / Amount: 3 years ago  CIWA:   COWS:    Allergies:  Allergies  Allergen Reactions  . Wellbutrin [Bupropion] Swelling    "makes him crazy"    Home Medications:  Medications Prior to Admission  Medication Sig Dispense Refill  . dimenhyDRINATE (DRAMAMINE) 50 MG tablet Take 50 mg by mouth every 8 (eight) hours as needed for nausea.    . methadone (DOLOPHINE) 10 MG/ML solution Take 160 mg by mouth daily.       OB/GYN Status:  No LMP for male patient.  General Assessment Data Location of Assessment: [facility] Assessment Services (walk-in) TTS Assessment: In system Is this a Tele or Face-to-Face Assessment?: Face-to-Face Is this an Initial Assessment or a Re-assessment for this encounter?: Initial Assessment Marital status: Single Is patient pregnant?: No Pregnancy Status: No Living Arrangements: Spouse/significant other, Children Can pt return to current living arrangement?: Yes Admission Status: Voluntary Is patient capable of signing voluntary admission?: Yes Referral Source: Self/Family/Friend Insurance type: [place] Screening Exam ([place]) Medical Exam completed: Yes  Crisis Care Plan Living Arrangements: Spouse/significant other, Children Name of Psychiatrist: none Name of Therapist: sees ones twice/mth at [employer]  Education Status Is patient currently in  school?: No  Risk to self with the past 6 months Suicidal Ideation: No Has patient been a risk to self within the past 6 months prior to admission? : No Suicidal Intent: No Has patient had any suicidal intent within the past 6 months prior to admission? : No Is patient at risk for suicide?: No Suicidal Plan?: No Has patient had any suicidal plan within the past 6  months prior to admission? : No Access to Means: No What has been your use of drugs/alcohol within the last 12 months?: see above Previous Attempts/Gestures: No Intentional Self Injurious Behavior: None Family Suicide History: No Persecutory voices/beliefs?: No Depression: No Substance abuse history and/or treatment for substance abuse?: Yes Suicide prevention information given to non-admitted patients: Not applicable  Risk to Others within the past 6 months Homicidal Ideation: No Does patient have any lifetime risk of violence toward others beyond the six months prior to admission? : No Thoughts of Harm to Others: No Current Homicidal Intent: No Current Homicidal Plan: No Access to Homicidal Means: No History of harm to others?: No Assessment of Violence: None Noted Does patient have access to weapons?: No Criminal Charges Pending?: No Does patient have a court date: No Is patient on probation?: No  Psychosis Hallucinations: None noted Delusions: None noted  Mental Status Report Appearance/Hygiene: Disheveled, Body odor Eye Contact: Poor Motor Activity: Agitation, Restlessness Speech: Pressured, Logical/coherent Level of Consciousness: Irritable, Restless Mood: Anxious, Irritable Affect: Anxious, Appropriate to circumstance, Irritable Anxiety Level: Severe Thought Processes: Coherent, Relevant Judgement: Unable to Assess Orientation: Appropriate for developmental age, Situation, Time, Place, Person Obsessive Compulsive Thoughts/Behaviors: None  Cognitive Functioning Concentration: Decreased Memory: Recent Intact, Remote Intact IQ: Average Insight: Fair Impulse Control: Fair Appetite: Poor Sleep: Decreased Total Hours of Sleep: 4 Vegetative Symptoms: None  ADLScreening [facility] Assessment Services) Patient's cognitive ability adequate to safely complete daily activities?: Yes Patient able to express need for assistance with ADLs?: Yes Independently performs ADLs?:  Yes (appropriate for developmental age)  Prior Inpatient Therapy Prior Inpatient Therapy: Yes Prior Therapy Dates: several times ; last time was 5 yrs ago Prior Therapy Facilty/Provider(s): unknown Reason for Treatment: drug abuse; anxiety  Prior Outpatient Therapy Prior Outpatient Therapy: No Does patient have an ACCT team?: No Does patient have Intensive In-House Services?  : No Does patient have [facility] services? : No Does patient have P4CC services?: No  ADL Screening (condition at time of admission) Patient's cognitive ability adequate to safely complete daily activities?: Yes Is the patient deaf or have difficulty hearing?: No Does the patient have difficulty seeing, even when wearing glasses/contacts?: No Does the patient have difficulty concentrating, remembering, or making decisions?: No Patient able to express need for assistance with ADLs?: Yes Does the patient have difficulty dressing or bathing?: No Independently performs ADLs?: Yes (appropriate for developmental age) Does the patient have difficulty walking or climbing stairs?: No Weakness of Legs: None Weakness of Arms/Hands: None     Therapy Consults (therapy consults require a physician order) PT Evaluation Needed: No OT Evalulation Needed: No SLP Evaluation Needed: No Abuse/Neglect Assessment (Assessment to be complete while patient is alone) Physical Abuse: Denies Verbal Abuse: Denies Sexual Abuse: Denies Exploitation of patient/patient's resources: Denies Self-Neglect: Denies Values / Beliefs Cultural Requests During Hospitalization: None Spiritual Requests During Hospitalization: None Consults Spiritual Care Consult Needed: No Social Work Consult Needed: No      Additional Information 1:1 In Past 12 Months?: No CIRT Risk: No Elopement Risk: No Does patient have medical clearance?:  Yes     Disposition:  Disposition Initial Assessment Completed for this Encounter: Yes Disposition of  Patient: Outpatient treatment (per Elmarie Shiley, NP) Type of outpatient treatment: Adult (pt accepted to [facility] OBS unit, bed 5)  On Site Evaluation by:   Reviewed with Physician:    Rexene Edison 06/30/2015 12:28 PM

## 2015-06-30 NOTE — Progress Notes (Signed)
Nursing Admission Note:  Patient admitted Voluntary status as a walk in with Dx of Generalized Anxiety Disorder. Patient is cooperative, polite, denies SI/HI/AVH but is extremely anxious even to the point of having difficulty talking to Nurse during Admission questions, though did improve greatly and is more relaxed in conversation and presentation. Patient admits to a previous cocaine addiction but quit 5 years ago, falling off the wagon for one night 3 years ago. Most recently patient is a patient of [facility] and is a patient at the Meth[facility]one Clinic in [place] as he states he became more and more dependent on opoid pain medicine for his back through the Pain Clinic who then instructed him to go to the Methadone Clinic. Patient denies any alcohol consumption and admits to occasional cannabis smoking. Patient telling Nurse he believes that most of his debilitating anxiety is due to his girlfriend's constantly being after him for any number of things including "working too much and never being home". The couple have a 25 year old who has had several operations for an unknown reason. Patient oriented to the Unit, Nurse ensuring constant supervision of patient except for when he is in the bathroom.

## 2015-06-30 NOTE — ED Notes (Signed)
Discharge instructions and follow up care reviewed with patient. Patient verbalized understanding. 

## 2015-07-01 DIAGNOSIS — F411 Generalized anxiety disorder: Secondary | ICD-10-CM | POA: Diagnosis not present

## 2015-07-01 MED ORDER — GABAPENTIN 100 MG PO CAPS: 100.0000 mg | ORAL_CAPSULE | Freq: Three times a day (TID) | ORAL | Status: DC

## 2015-07-01 MED ORDER — METHADONE HCL 10 MG PO TABS: 150.0000 mg | ORAL_TABLET | Freq: Every day | ORAL | Status: DC

## 2015-07-01 MED ORDER — ONDANSETRON 4 MG PO TBDP: 4.0000 mg | ORAL_TABLET | Freq: Three times a day (TID) | ORAL | Status: DC | PRN

## 2015-07-01 MED ORDER — HYDROXYZINE HCL 25 MG PO TABS: 25.0000 mg | ORAL_TABLET | Freq: Four times a day (QID) | ORAL | Status: DC | PRN

## 2015-07-01 NOTE — [facility] Counselor (Signed)
This Probation officer met with pt at his bedside to discuss discharge plans. Pt denies SI/HI and states that he recently quit his job to take care of his mental health needs and opiate addiction. Pt receives daily methadone doses at [facility] for chronic pain and history of opiate addiction. Pt admits to using marijuana and states that he has used cocaine in the past, but has working hard to turn his life around. Pt was seen by Elmarie Shiley, NP who suggested that he remain in OBS overnight and the final disposition be completed this morinng by A.M. Extender. Pt states that he is going to follow up with [facility] after being discharged from unit and states that he just needs to get back on his medications to decrease his anxiety. Pt is very optimistic about his recovery efforts and was provided support and encouragement throughout the shift.  Redmond Pulling, MA OBS Counselor

## 2015-07-01 NOTE — Discharge Instructions (Signed)
You have been provided with a list of outpatient resources. Please follow up with [facility] for continuation with medication treatment. For continuation of care for therapy and medication management please follow up with [facility].

## 2015-07-01 NOTE — Discharge Summary (Signed)
[facility] Discharge Summary Note  Patient:  David Andrews is an 24 y.o., male MRN:  [id number] DOB:  09-Feb-1991 Patient phone:  [phone] (home)  Patient address:   [address] [place]  [address],  Total Time spent with patient: 30 minutes  Date of Admission:  06/30/2015 Date of Discharge: 07/01/2015  Reason for Admission:  Panic attacks, acute anxiety   Principal Problem: GAD (generalized anxiety disorder) Discharge Diagnoses: Patient Active Problem List   Diagnosis Date Noted  . GAD (generalized anxiety disorder) [F41.1] 06/30/2015    Past Psychiatric History: GAD  Past Medical History:  Past Medical History  Diagnosis Date  . Herniated disc   . Gastric ulcer   . DDD (degenerative disc disease), lumbar     Past Surgical History  Procedure Laterality Date  . Wisdom tooth extraction     Family History:  Family History  Problem Relation Age of Onset  . Drug abuse Father    Social History:  History  Alcohol Use No     History  Drug Use  . Yes  . Special: Marijuana, "Crack" cocaine, Cocaine    Comment: Smokes cannabis occasionally; started snorting coke at age 16 when his father introduced him to it. 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; abstinent since then    Social History   Social History  . Marital Status: Single    Spouse Name: N/A  . Number of Children: N/A  . Years of Education: N/A   Social History Main Topics  . Smoking status: Current Every Day Smoker -- 2.00 packs/day    Types: Cigarettes  . Smokeless tobacco: Never Used  . Alcohol Use: No  . Drug Use: Yes    Special: Marijuana, "Crack" cocaine, Cocaine     Comment: Smokes cannabis occasionally; started snorting coke at age 49 when his father introduced him to it. 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; abstinent since then  . Sexual Activity:  Yes   Other Topics Concern  . None   Social History Narrative    Hospital Course:    David Andrews is an 24 y.o. male who presents voluntarily as a [facility] walk-in for c/o heightened anxiety. Patient received daily methadone dosing at [facility] for chronic pain and history of opiate addiction. The patient reported that his anxiety has been worsening for the past several weeks due to numerous psychosocial stressors with a history of anxiety disorder. David Andrews was seen at the [facility] last night with severe anxiety and discharged home with outpatient follow up. The patient stated "I have a lot going on. 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 I feel trapped having to get Methadone for three years. I can't go anywhere because I am tied to it. I have been getting valium off the streets and using that for two weeks. It's been five days since I have had any. I think that caused my symptoms to get worse. They also lowered my Methadone dosage from 160 mg to 150 mg because marijuana showed upon on a drug screen. I am having body aches so I think that might be why. I know medication will not solve all my problems but I need some help. I also have chronic problems with my girlfriend. Sometimes she does nothing around the house and does not work for periods of time. I've been  on Bipolar medications before. I get angry and irritable sometimes. 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 I've just been so anxious. I'm a little better now that my girlfriend is gone because she really stresses me out." Patient was cooperative with assessment and appeared calmer after taking a prn dose of vistaril. He mentioned that his Methadone Clinic MD had recommended that Neurontin be considered due to his history of chronic back pain. The patient expressed interest in being started on non addictive medication option. He denies any suicidal or homicidal ideation. Patient does not provide a history  that describes a manic episode that meet criteria for Bipolar Disorder. His symptoms and presentation suggest an anxiety disorder exacerbated by situational stressors. Patient's urine drug screen is positive for benzo and marijuana. The patient reports using valium for two weeks but has not had any in five days.   Patient was admitted to the [facility] Unit for further monitoring and evaluation. He was started on Neurontin 100 mg TID for and vistaril 25 mg as needed for anxiety. His Methadone dose of 150 mg daily was verified with [facility] and patient received his daily dose while in Observation unit on the morning of 07/01/2015. Patient reported some nausea from some withdrawal symptoms from abusing benzos off the street. He reported using for about two weeks for his anxiety. The patient stated "I will never do that again. I did not know I would get such terrible rebound anxiety. I do not want anything addictive. I am doing better. Not feelings as anxious. My family is going to help me figure out how to decrease some stress in my life. I am ready to go and want to follow up outpatient. I am not feeling suicidal. My anxiety is about a five." The patient was provided with prescriptions for Vistaril and Neurontin for 14 days. He plans to follow up at [facility] next Tuesday. Patient left [facility] with all belongings returned in no acute distress.   Physical Findings: AIMS: Facial and Oral Movements Muscles of Facial Expression: None, normal Lips and Perioral Area: None, normal Jaw: None, normal Tongue: None, normal,Extremity Movements Upper (arms, wrists, hands, fingers): None, normal Lower (legs, knees, ankles, toes): None, normal, Trunk Movements Neck, shoulders, hips: None, normal, Overall Severity Severity of abnormal movements (highest score from questions above): None, normal Incapacitation due to abnormal movements: None, normal Patient's awareness of abnormal movements (rate only patient's  report): No Awareness, Dental Status Current problems with teeth and/or dentures?: No Does patient usually wear dentures?: No  CIWA:  CIWA-Ar Total: 7 COWS:  COWS Total Score: 8  Musculoskeletal: Strength & Muscle Tone: within normal limits Gait & Station: normal Patient leans: N/A  Psychiatric Specialty Exam: Review of Systems  Constitutional: Negative.   HENT: Negative.   Eyes: Negative.   Respiratory: Negative.   Cardiovascular: Negative.   Gastrointestinal: Negative.   Genitourinary: Negative.   Musculoskeletal: Negative.   Skin: Negative.   Neurological: Negative.   Endo/Heme/Allergies: Negative.   Psychiatric/Behavioral: Negative for depression, suicidal ideas, hallucinations, memory loss and substance abuse. The patient is nervous/anxious. The patient does not have insomnia.     Blood pressure 121/86, pulse 91, temperature 97.3 F (36.3 C), temperature source Oral, resp. rate 18, height 5' 7.5" (1.715 m), weight 65.091 kg (143 lb 8 oz), SpO2 100 %.Body mass index is 22.13 kg/(m^2).  General Appearance: Casual  Eye Contact::  Good  Speech:  Clear and Coherent  Volume:  Normal  Mood:  Anxious  Affect:  Appropriate  Thought Process:  Goal Directed and Intact  Orientation:  Full (Time, Place, and Person)  Thought Content:  WDL  Suicidal Thoughts:  No  Homicidal Thoughts:  No  Memory:  Immediate;   Good Recent;   Good Remote;   Good  Judgement:  Fair  Insight:  Good  Psychomotor Activity:  Normal  Concentration:  Good  Recall:  Good  Fund of Knowledge:Good  Language: Good  Akathisia:  No  Handed:  Right  AIMS (if indicated):     Assets:  Communication Skills Desire for Improvement Financial Resources/Insurance Housing Intimacy Leisure Time Physical Health Resilience Social Support Talents/Skills  ADL's:  Intact  Cognition: WNL  Sleep:      Have you used any form of tobacco in the last 30 days? (Cigarettes, Smokeless Tobacco, Cigars, and/or Pipes):  Yes  Has this patient used any form of tobacco in the last 30 days? (Cigarettes, Smokeless Tobacco, Cigars, and/or Pipes) Yes, No  Metabolic Disorder Labs:  No results found for: HGBA1C, MPG No results found for: PROLACTIN No results found for: CHOL, TRIG, HDL, CHOLHDL, VLDL, LDLCALC  See Psychiatric Specialty Exam and Suicide Risk Assessment completed by Attending Physician prior to discharge.  Discharge destination:  Home  Is patient on multiple antipsychotic therapies at discharge:  No   Has Patient had three or more failed trials of antipsychotic monotherapy by history:  No  Recommended Plan for Multiple Antipsychotic Therapies: NA      Discharge Instructions    Discharge instructions    Complete by:  As directed   Please follow up with [facility] as discussed for further medication management.            Medication List    STOP taking these medications        dimenhyDRINATE 50 MG tablet  Commonly known as:  DRAMAMINE     methadone 10 MG/ML solution  Commonly known as:  DOLOPHINE  Replaced by:  methadone 10 MG tablet      TAKE these medications      Indication   gabapentin 100 MG capsule  Commonly known as:  NEURONTIN  Take 1 capsule (100 mg total) by mouth 3 (three) times daily.   Indication:  Agitation, Anxiety     hydrOXYzine 25 MG tablet  Commonly known as:  ATARAX/VISTARIL  Take 1 tablet (25 mg total) by mouth every 6 (six) hours as needed for anxiety.   Indication:  Anxiety Neurosis     methadone 10 MG tablet  Commonly known as:  DOLOPHINE  Take 15 tablets (150 mg total) by mouth daily.   Indication:  Opioid Dependence     ondansetron 4 MG disintegrating tablet  Commonly known as:  ZOFRAN-ODT  Take 1 tablet (4 mg total) by mouth every 8 (eight) hours as needed for nausea or vomiting.   Indication:  Nausea       Follow-up Information    Go to [facility].   Specialty:  Behavioral Health   Why:  for continution of care/ therapy and medication  management   Contact information:   [place] [address] [phone]       Follow up with [facility]. Go in 1 day.   Why:  follow up and coordination of care   Contact information:    [place], [place], [place] [address]     Phone: [phone]  Follow-up recommendations:   As above   Comments:   Take all your medications as prescribed by your mental healthcare provider.  Report any adverse effects and or reactions from your medicines to your outpatient provider promptly.  Patient is instructed and cautioned to not engage in alcohol and or illegal drug use while on prescription medicines.  In the event of worsening symptoms, patient is instructed to call the crisis hotline, 911 and or go to the nearest ED for appropriate evaluation and treatment of symptoms.  Follow-up with your primary care provider for your other medical issues, concerns and or health care needs.   SignedElmarie Shiley, NP-C 07/01/2015, 2:42 PM  Agree with NP Assessment and Recommendations

## 2015-07-01 NOTE — Progress Notes (Signed)
Nursing Discharge Note:  Patient continues to deny any SI/HI/AVH, has been given copy of discharge instructions and prescriptions. Patient signing one copy of Discharge Instructions for chart, and also receiving a copy for himself. Patient's belongings returned to him and signed for. Patient states understanding of his medications and his follow-up appointments and plans. Patient escorted to the Lobby by staff to be picked up by father-in-law.

## 2015-11-24 ENCOUNTER — Encounter (HOSPITAL_COMMUNITY): Payer: Self-pay

## 2015-11-24 ENCOUNTER — Emergency Department (HOSPITAL_COMMUNITY): Payer: BLUE CROSS/BLUE SHIELD

## 2015-11-24 ENCOUNTER — Emergency Department (HOSPITAL_COMMUNITY)
Admission: EM | Admit: 2015-11-24 | Discharge: 2015-11-24 | Disposition: A | Discharge status: Home / Self Care | Payer: BLUE CROSS/BLUE SHIELD | Attending: Emergency Medicine | Admitting: Emergency Medicine

## 2015-11-24 DIAGNOSIS — S20211A Contusion of right front wall of thorax, initial encounter: Secondary | ICD-10-CM | POA: Diagnosis not present

## 2015-11-24 DIAGNOSIS — F17210 Nicotine dependence, cigarettes, uncomplicated: Secondary | ICD-10-CM | POA: Diagnosis not present

## 2015-11-24 DIAGNOSIS — Z79899 Other long term (current) drug therapy: Secondary | ICD-10-CM | POA: Diagnosis not present

## 2015-11-24 DIAGNOSIS — S0992XA Unspecified injury of nose, initial encounter: Secondary | ICD-10-CM

## 2015-11-24 DIAGNOSIS — Z79891 Long term (current) use of opiate analgesic: Secondary | ICD-10-CM | POA: Diagnosis not present

## 2015-11-24 DIAGNOSIS — S0240DA Maxillary fracture, left side, initial encounter for closed fracture: Secondary | ICD-10-CM | POA: Diagnosis not present

## 2015-11-24 DIAGNOSIS — Y939 Activity, unspecified: Secondary | ICD-10-CM | POA: Insufficient documentation

## 2015-11-24 DIAGNOSIS — Y999 Unspecified external cause status: Secondary | ICD-10-CM | POA: Insufficient documentation

## 2015-11-24 DIAGNOSIS — Y929 Unspecified place or not applicable: Secondary | ICD-10-CM | POA: Insufficient documentation

## 2015-11-24 NOTE — Discharge Instructions (Signed)
May take tylenol or motrin.  Fine to continue your methadone treatments. Follow-up with ENT, Dr. Constance Holster-- call his office to make appt to ensure nose heals properly. Return here for new concerns.

## 2015-11-24 NOTE — ED Notes (Signed)
[employer] at bedside per pt request.
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
## 2015-11-24 NOTE — ED Notes (Signed)
Patient left his heart medication at home, propanolol, would like his normal dosage.  20 mg/3x day

## 2016-03-24 ENCOUNTER — Emergency Department (HOSPITAL_COMMUNITY)
Admission: EM | Admit: 2016-03-24 | Discharge: 2016-03-25 | Disposition: A | Discharge status: Home / Self Care | Payer: Medicaid Other | Attending: Emergency Medicine | Admitting: Emergency Medicine

## 2016-03-24 ENCOUNTER — Encounter (HOSPITAL_COMMUNITY): Payer: Self-pay | Admitting: Emergency Medicine

## 2016-03-24 DIAGNOSIS — F411 Generalized anxiety disorder: Secondary | ICD-10-CM | POA: Diagnosis present

## 2016-03-24 DIAGNOSIS — Z8521 Personal history of malignant neoplasm of larynx: Secondary | ICD-10-CM | POA: Insufficient documentation

## 2016-03-24 DIAGNOSIS — F17210 Nicotine dependence, cigarettes, uncomplicated: Secondary | ICD-10-CM | POA: Insufficient documentation

## 2016-03-24 DIAGNOSIS — F1120 Opioid dependence, uncomplicated: Secondary | ICD-10-CM | POA: Insufficient documentation

## 2016-03-24 DIAGNOSIS — F1920 Other psychoactive substance dependence, uncomplicated: Secondary | ICD-10-CM | POA: Diagnosis present

## 2016-03-24 DIAGNOSIS — Z79899 Other long term (current) drug therapy: Secondary | ICD-10-CM | POA: Diagnosis not present

## 2016-03-24 DIAGNOSIS — F419 Anxiety disorder, unspecified: Secondary | ICD-10-CM

## 2016-03-24 HISTORY — DX: Malignant neoplasm of larynx, unspecified: C32.9

## 2016-03-24 LAB — CBC WITH DIFFERENTIAL/PLATELET
Basophils Absolute: 0.1 10*3/uL (ref 0.0–0.1)
Basophils Relative: 1 %
EOS ABS: 0.8 10*3/uL — AB (ref 0.0–0.7)
EOS PCT: 8 %
HCT: 39 % (ref 39.0–52.0)
Hemoglobin: 14 g/dL (ref 13.0–17.0)
LYMPHS ABS: 2.8 10*3/uL (ref 0.7–4.0)
Lymphocytes Relative: 30 %
MCH: 31 pg (ref 26.0–34.0)
MCHC: 35.9 g/dL (ref 30.0–36.0)
MCV: 86.3 fL (ref 78.0–100.0)
MONOS PCT: 10 %
Monocytes Absolute: 1 10*3/uL (ref 0.1–1.0)
Neutro Abs: 4.8 10*3/uL (ref 1.7–7.7)
Neutrophils Relative %: 51 %
PLATELETS: 249 10*3/uL (ref 150–400)
RBC: 4.52 MIL/uL (ref 4.22–5.81)
RDW: 12.2 % (ref 11.5–15.5)
WBC: 9.4 10*3/uL (ref 4.0–10.5)

## 2016-03-24 LAB — COMPREHENSIVE METABOLIC PANEL
ALBUMIN: 4.6 g/dL (ref 3.5–5.0)
ALT: 16 U/L — ABNORMAL LOW (ref 17–63)
AST: 22 U/L (ref 15–41)
Alkaline Phosphatase: 77 U/L (ref 38–126)
Anion gap: 9 (ref 5–15)
BUN: 16 mg/dL (ref 6–20)
CHLORIDE: 105 mmol/L (ref 101–111)
CO2: 28 mmol/L (ref 22–32)
Calcium: 9.7 mg/dL (ref 8.9–10.3)
Creatinine, Ser: 0.99 mg/dL (ref 0.61–1.24)
GFR calc Af Amer: >60 mL/min (ref >60)
Glucose, Bld: 90 mg/dL (ref 65–99)
POTASSIUM: 3.3 mmol/L — AB (ref 3.5–5.1)
SODIUM: 142 mmol/L (ref 135–145)
Total Bilirubin: 0.8 mg/dL (ref 0.3–1.2)
Total Protein: 7.8 g/dL (ref 6.5–8.1)

## 2016-03-24 LAB — ETHANOL

## 2016-03-24 MED ORDER — LOPERAMIDE HCL 2 MG PO CAPS: 2.0000 mg | ORAL_CAPSULE | ORAL | Status: DC | PRN

## 2016-03-24 MED ORDER — CLONIDINE HCL 0.1 MG PO TABS
0.1000 mg | ORAL_TABLET | Freq: Every day | ORAL | Status: DC
  Administered 2016-03-25: 0.1 mg via ORAL

## 2016-03-24 MED ORDER — ONDANSETRON 4 MG PO TBDP: 4.0000 mg | ORAL_TABLET | Freq: Four times a day (QID) | ORAL | Status: DC | PRN

## 2016-03-24 MED ORDER — GABAPENTIN 300 MG PO CAPS
300.0000 mg | ORAL_CAPSULE | Freq: Three times a day (TID) | ORAL | Status: DC
  Administered 2016-03-24 – 2016-03-25 (×2): 300 mg via ORAL
  Filled 2016-03-24 (×2): qty 1

## 2016-03-24 MED ORDER — DICYCLOMINE HCL 20 MG PO TABS
20.0000 mg | ORAL_TABLET | Freq: Four times a day (QID) | ORAL | Status: DC | PRN
  Administered 2016-03-25: 20 mg via ORAL
  Filled 2016-03-24: qty 1

## 2016-03-24 MED ORDER — PROPRANOLOL HCL 20 MG PO TABS
20.0000 mg | ORAL_TABLET | Freq: Three times a day (TID) | ORAL | Status: DC
  Administered 2016-03-24 – 2016-03-25 (×2): 20 mg via ORAL
  Filled 2016-03-24 (×4): qty 1

## 2016-03-24 MED ORDER — CLONIDINE HCL 0.1 MG PO TABS
0.1000 mg | ORAL_TABLET | Freq: Four times a day (QID) | ORAL | Status: DC
  Filled 2016-03-24: qty 1

## 2016-03-24 MED ORDER — HYDROXYZINE HCL 25 MG PO TABS
25.0000 mg | ORAL_TABLET | Freq: Four times a day (QID) | ORAL | Status: DC | PRN
  Filled 2016-03-24: qty 1

## 2016-03-24 MED ORDER — NAPROXEN 500 MG PO TABS
500.0000 mg | ORAL_TABLET | Freq: Two times a day (BID) | ORAL | Status: DC | PRN
  Administered 2016-03-25: 500 mg via ORAL
  Filled 2016-03-24: qty 1

## 2016-03-24 MED ORDER — CLONIDINE HCL 0.1 MG PO TABS: 0.1000 mg | ORAL_TABLET | ORAL | Status: DC

## 2016-03-24 MED ORDER — METHOCARBAMOL 500 MG PO TABS
500.0000 mg | ORAL_TABLET | Freq: Three times a day (TID) | ORAL | Status: DC | PRN
  Administered 2016-03-25: 500 mg via ORAL
  Filled 2016-03-24: qty 1

## 2016-03-24 NOTE — ED Notes (Signed)
Pt tearful, states "I was never suicidal. My mom just got worried about me. My girlfriend and I broke up and her kids were taken away from her yesterday. So I was upset, but I wasn't gonna kill myself". Pt verbally contracts for safety.

## 2016-03-24 NOTE — ED Provider Notes (Signed)
[facility] DEPT Provider Note   CSN: [id number] Arrival date & time: 03/24/16  1536     History   Chief Complaint Chief Complaint  Patient presents with  . IVC    HPI David Andrews is a 25 y.o. male.  HPI Patient presents under involuntary commitment. States that he had some issues with his girlfriend and had to be in [place]. States that he called his mother and states he was having trouble dealing with it. Patient states that he is not suicidal or homicidal. Does have a history of bipolar but is not on any medications for it. States he has psychiatry and therapist that he sees. Denies drug abuse. States he is not sure why his mother did the IVC paperwork. 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 States he is on it for his chronic pain. States that he did not get the dose today. States he gets through the methadone clinic.   Past Medical History:  Diagnosis Date  . DDD (degenerative disc disease), lumbar   . Gastric ulcer   . Herniated disc   . Larynx cancer [facility])     Patient Active Problem List   Diagnosis Date Noted  . GAD (generalized anxiety disorder) 06/30/2015    Past Surgical History:  Procedure Laterality Date  . WISDOM TOOTH EXTRACTION         Home Medications    Prior to Admission medications   Medication Sig Start Date End Date Taking? Authorizing Provider  gabapentin (NEURONTIN) 300 MG capsule Take 300 mg by mouth 3 (three) times daily.   Yes Historical Provider, MD  methadone (DOLOPHINE) 10 MG/ML solution Take 170 mg by mouth daily.   Yes Historical Provider, MD  ondansetron (ZOFRAN-ODT) 4 MG disintegrating tablet Take 1 tablet (4 mg total) by mouth every 8 (eight) hours as needed for nausea or vomiting. 07/01/15  Yes Niel Hummer, NP  propranolol (INDERAL) 20 MG tablet Take 20 mg by mouth 3 (three) times daily.   Yes Historical Provider, MD    Family History Family History  Problem Relation Age of Onset  . Drug abuse Father      Social History Social History  Substance Use Topics  . Smoking status: Current Every Day Smoker    Packs/day: 2.00    Types: Cigarettes  . Smokeless tobacco: Never Used  . Alcohol use No     Allergies   Prozac [fluoxetine hcl] and Wellbutrin [bupropion]   Review of Systems Review of Systems  Constitutional: Negative for appetite change.  Respiratory: Negative for shortness of breath.   Cardiovascular: Negative for chest pain.  Gastrointestinal: Negative for abdominal pain.  Genitourinary: Negative for dysuria.  Musculoskeletal: Negative for back pain.  Neurological: Negative for weakness and light-headedness.  Psychiatric/Behavioral: Negative for confusion, decreased concentration, hallucinations and suicidal ideas.     Physical Exam Updated Vital Signs BP 113/82 (BP Location: Right Arm)   Pulse 96   Temp 98.2 F (36.8 C) (Oral)   Resp 15   SpO2 97%   Physical Exam  Constitutional: He appears well-developed.  HENT:  Head: Atraumatic.  Neck: Neck supple.  Cardiovascular: Normal rate.   Pulmonary/Chest: Effort normal.  Abdominal: Soft. He exhibits no distension.  Musculoskeletal: He exhibits no edema.  Neurological: He is alert.  Skin: Skin is warm. Capillary refill takes less than 2 seconds.  Psychiatric: He has a normal mood and affect.     ED Treatments / Results  Labs (all labs ordered are  listed, but only abnormal results are displayed) Labs Reviewed  COMPREHENSIVE METABOLIC PANEL  ETHANOL  URINE RAPID DRUG SCREEN, HOSP PERFORMED  URINALYSIS, ROUTINE W REFLEX MICROSCOPIC (NOT AT [facility])  CBC WITH DIFFERENTIAL/PLATELET    EKG  EKG Interpretation None       Radiology No results found.  Procedures Procedures (including critical care time)  Medications Ordered in ED Medications - No data to display   Initial Impression / Assessment and Plan / ED Course  I have reviewed the triage vital signs and the nursing notes.  Pertinent labs &  imaging results that were available during my care of the patient were reviewed by me and considered in my medical decision making (see chart for details).  Clinical Course    Patient here under IVC by his mother. Reportedly made some statements that worried her. Denies suicidal ideations. States that he is on methadone but so far been unable to verify this. States he is on 170 mg he was previously seen in the ER and at bedtime was verified he was on 150 mg. Will start on detox protocol until we can verify. Medically cleared. To be seen by TTS.  Final Clinical Impressions(s) / ED Diagnoses   Final diagnoses:  None    New Prescriptions New Prescriptions   No medications on file     Davonna Belling, MD 03/24/16 1857

## 2016-03-24 NOTE — ED Notes (Signed)
Bed: WTR5 Expected date:  Expected time:  Means of arrival:  Comments: 

## 2016-03-24 NOTE — ED Notes (Signed)
Pt currently asleep and falling asleep between sentences. No s/s of distress noted. Pt states "I am sleepy, goodnight".

## 2016-03-24 NOTE — BH Assessment (Addendum)
Assessment Note  David Andrews is an 25 y.o. male that presents this date under IVC. Per IVC: "Respondent is diagnosed with bipolar/GAD and is prescribed medications although is not compliant with current regimen. Respondent is on Methadone 170 mg (per patient) but has not taken it recently. Respondent stated this date that he was going to kill himself due to a recent breakup up with girlfriend and death of his grandmother." Patient presents with a anxious affect this date but denies any S/I, H/I or AVH. Patient did admit to a recent break up with partner but stated he "would never hurt himself." Patient denies any prior gestures/attempts at self harm. Patient does have recent charges associated with carry a concealed weapon but states all of his firearms are currently locked in a gun safe. Patient states he does have access but has not thoughts of self harm. Patient denies any H/I or AVH. Patient is currently on methadone that he receives from [facility] for pain management (per patient). Patient states he has been receiving 170 mg daily for the last five years. Patient reports he has not received that medication this date but denies any withdrawals although he states "they are coming." Patient reports he receives MH medications from [place] [facility] for the last year but cannot recall what that medication was. Patient also reports non compliance of MH medications, patient stated he has been prescribed medications for anxiety but denies any Bipolar issues.Patient states he has chronic pain management issues and denies any current SA use. Patient does report a history of illicit SA use but states he has been maintaining his sobriety since he has been on methadone. Patient also reports that he receives therapy at [facility] reporting seeing Kenton Kingfisher [facility] for over five years as part of the methadone program. Patient reports one admission in 2013 at [facility] for anxiety and SA issues. Patient is oriented to time/place and is  pleasant as he interacts with this Probation officer. Admission notes stated: "Patient transported from home via [employer] with IVC paperwork stating the pt claimed SI to his mother on the telephone. Per the pt he told her "he can't take it" but pt states he was referring to stress secondary to a recent break up and interaction with a sheriff this morning (pt was sleeping in his car at a gas station and a sheriff stopped him and had him preform a sobriety test which the pt states he passed). Pt is calm and cooperative at time of assessment. Pt denies SI, HI, AVH. Pt states he is seeing a therapist for his bipolar disorder and has been able to successfully manage his disease without medication for over 2 years. Pt is currently undergoing treatment from a methadone clinic and was unable to make his appt this morning due to his interaction with the sheriff at the gas station". Case was staffed with Reita Cliche DNP who recommended an inpatient admission.  Diagnosis: Bipolar GAD (per notes)   Past Medical History:  Past Medical History:  Diagnosis Date  . DDD (degenerative disc disease), lumbar   . Gastric ulcer   . Herniated disc   . Larynx cancer [facility])     Past Surgical History:  Procedure Laterality Date  . WISDOM TOOTH EXTRACTION      Family History:  Family History  Problem Relation Age of Onset  . Drug abuse Father     Social History:  reports that he has been smoking Cigarettes.  He has been smoking about 2.00 packs per day. He has never  used smokeless tobacco. He reports that he uses drugs, including Marijuana and "Crack" cocaine. He reports that he does not drink alcohol.  Additional Social History:  Alcohol / Drug Use Pain Medications: see MAR Prescriptions: see MAR Over the Counter: see MAR History of alcohol / drug use?: Yes (currently denies pt is on methadone) Longest period of sobriety (when/how long): current Withdrawal Symptoms:  (none noted)  CIWA: CIWA-Ar BP: 113/82 Pulse Rate: 96 COWS:     Allergies:  Allergies  Allergen Reactions  . Prozac [Fluoxetine Hcl] Other (See Comments)    Per pt adverse mental reaction  . Wellbutrin [Bupropion] Swelling    "makes him crazy"    Home Medications:  (Not in a hospital admission)  OB/GYN Status:  No LMP for male patient.  General Assessment Data Location of Assessment: [facility] ED TTS Assessment: In system Is this a Tele or Face-to-Face Assessment?: Face-to-Face Is this an Initial Assessment or a Re-assessment for this encounter?: Initial Assessment Marital status: Single Maiden name: na Is patient pregnant?: No Pregnancy Status: No Living Arrangements: Alone Can pt return to current living arrangement?: Yes Admission Status: Involuntary Is patient capable of signing voluntary admission?: Yes Referral Source: Self/Family/Friend Insurance type: BC/BS Medicaid   Medical Screening Exam ([place]) Medical Exam completed: Yes  Crisis Care Plan Living Arrangements: Alone Legal Guardian:  (na) Name of Psychiatrist: [facility] Name of Therapist: Kenton Kingfisher [facility]  Education Status Is patient currently in school?: No Current Grade: na Highest grade of school patient has completed: 57 Name of school: na Contact person: na  Risk to self with the past 6 months Suicidal Ideation: No Has patient been a risk to self within the past 6 months prior to admission? : No Suicidal Intent: No Has patient had any suicidal intent within the past 6 months prior to admission? : No Is patient at risk for suicide?: No Suicidal Plan?: No Has patient had any suicidal plan within the past 6 months prior to admission? : No Access to Means: No What has been your use of drugs/alcohol within the last 12 months?: Past use pt on methadone Previous Attempts/Gestures: No How many times?: 0 Other Self Harm Risks: none Triggers for Past Attempts: Unknown Intentional Self Injurious Behavior: None Family Suicide History: No Recent stressful life  event(s): Other (Comment) (relationship issues) Persecutory voices/beliefs?: No Depression: No Depression Symptoms:  (na) Substance abuse history and/or treatment for substance abuse?: Yes Suicide prevention information given to non-admitted patients: Not applicable  Risk to Others within the past 6 months Homicidal Ideation: No Does patient have any lifetime risk of violence toward others beyond the six months prior to admission? : No Thoughts of Harm to Others: No Current Homicidal Intent: No Current Homicidal Plan: No Access to Homicidal Means: No Identified Victim: na History of harm to others?: No Assessment of Violence: None Noted Violent Behavior Description: na Does patient have access to weapons?: No (weapons are locked in safe ) Criminal Charges Pending?: Yes Describe Pending Criminal Charges: concealed weapon Does patient have a court date: No  Psychosis Hallucinations: None noted Delusions: None noted  Mental Status Report Appearance/Hygiene: In scrubs Eye Contact: Good Motor Activity: Freedom of movement Speech: Logical/coherent Level of Consciousness: Alert Mood: Anxious Affect: Anxious Anxiety Level: Moderate Thought Processes: Coherent, Relevant Judgement: Unimpaired Orientation: Person, Place, Time Obsessive Compulsive Thoughts/Behaviors: None  Cognitive Functioning Concentration: Normal Memory: Recent Intact, Remote Intact IQ: Average Insight: Fair Impulse Control: Fair Appetite: Fair Weight Loss: 0 Weight Gain: 0  Sleep: No Change Total Hours of Sleep: 8 Vegetative Symptoms: None  ADLScreening [facility] Assessment Services) Patient's cognitive ability adequate to safely complete daily activities?: Yes Patient able to express need for assistance with ADLs?: Yes Independently performs ADLs?: Yes (appropriate for developmental age)  Prior Inpatient Therapy Prior Inpatient Therapy: Yes Prior Therapy Dates: 2016 Prior Therapy Facilty/Provider(s):  [facility] Reason for Treatment: Anxiety SA issues  Prior Outpatient Therapy Prior Outpatient Therapy: Yes Prior Therapy Dates: 2017 Prior Therapy Facilty/Provider(s): [facility] Reason for Treatment: Methadone Does patient have an ACCT team?: No Does patient have Intensive In-House Services?  : No Does patient have [facility] services? : Yes Does patient have P4CC services?: No  ADL Screening (condition at time of admission) Patient's cognitive ability adequate to safely complete daily activities?: Yes Is the patient deaf or have difficulty hearing?: No Does the patient have difficulty seeing, even when wearing glasses/contacts?: No Does the patient have difficulty concentrating, remembering, or making decisions?: No Patient able to express need for assistance with ADLs?: Yes Does the patient have difficulty dressing or bathing?: No Independently performs ADLs?: Yes (appropriate for developmental age) Does the patient have difficulty walking or climbing stairs?: No Weakness of Legs: None  Home Assistive Devices/Equipment Home Assistive Devices/Equipment: None  Therapy Consults (therapy consults require a physician order) PT Evaluation Needed: No OT Evalulation Needed: No SLP Evaluation Needed: No Abuse/Neglect Assessment (Assessment to be complete while patient is alone) Physical Abuse: Denies Verbal Abuse: Denies Sexual Abuse: Denies Exploitation of patient/patient's resources: Denies Self-Neglect: Denies Values / Beliefs Cultural Requests During Hospitalization: None Spiritual Requests During Hospitalization: None Consults Spiritual Care Consult Needed: No Social Work Consult Needed: No Regulatory affairs officer (For Healthcare) Does patient have an advance directive?: No Would patient like information on creating an advanced directive?: No - patient declined information    Additional Information 1:1 In Past 12 Months?: No CIRT Risk: No Elopement Risk: No Does patient have  medical clearance?: Yes     Disposition: Case was staffed with Reita Cliche DNP who recommended an inpatient admission.     On Site Evaluation by:   Reviewed with Physician:    Mamie Nick 03/24/2016 6:43 PM

## 2016-03-24 NOTE — ED Notes (Signed)
x1 unsuccessful blood draw. After attempt pt raised entire body from chair stating "I can't do this right now".

## 2016-03-24 NOTE — ED Notes (Signed)
Pt tearful, rocking back and forth and asking repeatedly to have his Methadone dose tonight. Dr. Alvino Chapel notified of request. MD states will give if verified at Methadone clinic. Per pharmacy, clinic closed at this time, but 160 mg was verified by clinic in December of 2016. Dr. Alvino Chapel notified of information.

## 2016-03-24 NOTE — ED Triage Notes (Signed)
Pt from home via [employer] with IVC paperwork stating the pt claimed SI to his mother on the telephone. Per the pt he told her "he can't take it" but pt states he was referring to stress secondary to a recent break up and interaction with a sheriff this morning (pt was sleeping in his car at a gas station and a sheriff stopped him and had him preform a sobriety test which the pt states he passed). Pt is calm and cooperative at time of assessment. Pt denies SI, HI, AVH. Pt states he is seeing a therapist for his bipolar disorder and has been able to successfully manage his disease without medication for over 2 years. Pt is currently undergoing treatment from a methadone clinic and was unable to make his appt this morning due to his interaction with the sheriff at the gas station. Pt currently has complaints of nausea

## 2016-03-24 NOTE — ED Notes (Addendum)
Pt's mother, contact info, [phone]- Alexis Frock

## 2016-03-25 DIAGNOSIS — F411 Generalized anxiety disorder: Secondary | ICD-10-CM

## 2016-03-25 DIAGNOSIS — F1920 Other psychoactive substance dependence, uncomplicated: Secondary | ICD-10-CM | POA: Diagnosis present

## 2016-03-25 LAB — RAPID URINE DRUG SCREEN, HOSP PERFORMED
AMPHETAMINES: POSITIVE — AB
BENZODIAZEPINES: POSITIVE — AB
Barbiturates: NOT DETECTED
Cocaine: NOT DETECTED
Opiates: NOT DETECTED
Tetrahydrocannabinol: POSITIVE — AB

## 2016-03-25 LAB — URINALYSIS, ROUTINE W REFLEX MICROSCOPIC
BILIRUBIN URINE: NEGATIVE
Glucose, UA: NEGATIVE mg/dL
HGB URINE DIPSTICK: NEGATIVE
Ketones, ur: NEGATIVE mg/dL
Leukocytes, UA: NEGATIVE
Nitrite: NEGATIVE
PH: 5.5 (ref 5.0–8.0)
Protein, ur: NEGATIVE mg/dL
SPECIFIC GRAVITY, URINE: 1.013 (ref 1.005–1.030)

## 2016-03-25 MED ORDER — HYDROXYZINE HCL 25 MG PO TABS: 25.0000 mg | ORAL_TABLET | Freq: Four times a day (QID) | ORAL | 0 refills | Status: DC | PRN
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
## 2016-03-25 NOTE — [facility] Suicide Risk Assessment (Signed)
Suicide Risk Assessment  Discharge Assessment   [facility] Discharge Suicide Risk Assessment   Principal Problem: GAD (generalized anxiety disorder) Discharge Diagnoses:  Patient Active Problem List   Diagnosis Date Noted  . 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 GAD (generalized anxiety disorder) [F41.1] 06/30/2015    Priority: High    Total Time spent with patient: 45 minutes   Musculoskeletal: Strength & Muscle Tone: within normal limits Gait & Station: normal Patient leans: N/A  Psychiatric Specialty Exam: Physical Exam  Constitutional: He is oriented to person, place, and time. He appears well-developed and well-nourished.  HENT:  Head: Normocephalic.  Neck: Normal range of motion.  Respiratory: Effort normal.  Musculoskeletal: Normal range of motion.  Neurological: He is alert and oriented to person, place, and time.  Skin: Skin is warm and dry.  Psychiatric: His speech is normal and behavior is normal. Judgment and thought content normal. His mood appears anxious. Cognition and memory are normal.    Review of Systems  Constitutional: Negative.   HENT: Negative.   Eyes: Negative.   Respiratory: Negative.   Cardiovascular: Negative.   Gastrointestinal: Negative.   Genitourinary: Negative.   Musculoskeletal: Negative.   Skin: Negative.   Neurological: Negative.   Endo/Heme/Allergies: Negative.   Psychiatric/Behavioral: Positive for substance abuse. The patient is nervous/anxious.     Blood pressure 119/73, pulse 64, temperature 97.8 F (36.6 C), temperature source Oral, resp. rate 16, SpO2 98 %.There is no height or weight on file to calculate BMI.  General Appearance: Casual  Eye Contact:  Good  Speech:  Normal Rate  Volume:  Normal  Mood:  Anxious  Affect:  Congruent  Thought Process:  Coherent and Descriptions of Associations: Intact  Orientation:  Full (Time, Place, and Person)   Thought Content:  WDL  Suicidal Thoughts:  No  Homicidal Thoughts:  No  Memory:  Immediate;   Good Recent;   Good Remote;   Good  Judgement:  Fair  Insight:  Fair  Psychomotor Activity:  Normal  Concentration:  Concentration: Good and Attention Span: Good  Recall:  Good  Fund of Knowledge:  Fair  Language:  Good  Akathisia:  No  Handed:  Right  AIMS (if indicated):     Assets:  Housing Leisure Time Physical Health Resilience Social Support  ADL's:  Intact  Cognition:  WNL  Sleep:       Mental Status Per Nursing Assessment::   On Admission:   depression and anxiety  Demographic Factors:  Male, Adolescent or young adult and Caucasian  Loss Factors: NA  Historical Factors: NA  Risk Reduction Factors:   Sense of responsibility to family, Living with another person, especially a relative and Positive social support  Continued Clinical Symptoms:  Anxiety, mild  Cognitive Features That Contribute To Risk:  None    Suicide Risk:  Minimal: No identifiable suicidal ideation.  Patients presenting with no risk factors but with morbid ruminations; may be classified as minimal risk based on the severity of the depressive symptoms    Plan Of Care/Follow-up recommendations:  Activity:  as tolerated Diet:  heart healthy diet  LORD, JAMISON, NP 03/25/2016, 10:50 AM

## 2016-03-25 NOTE — ED Notes (Signed)
Pt discharged ambulatory with mother.  Pt was given discharge instructions and RX and reviewed with him.  All belongings were returned to pt.

## 2016-03-26 ENCOUNTER — Telehealth (HOSPITAL_BASED_OUTPATIENT_CLINIC_OR_DEPARTMENT_OTHER): Payer: Self-pay | Admitting: Emergency Medicine

## 2017-02-20 ENCOUNTER — Encounter (HOSPITAL_COMMUNITY): Payer: Self-pay

## 2017-02-20 ENCOUNTER — Emergency Department (HOSPITAL_COMMUNITY)
Admission: EM | Admit: 2017-02-20 | Discharge: 2017-02-20 | Disposition: A | Discharge status: Home / Self Care | Payer: Medicaid Other | Attending: Emergency Medicine | Admitting: Emergency Medicine

## 2017-02-20 ENCOUNTER — Emergency Department (HOSPITAL_COMMUNITY): Payer: Medicaid Other

## 2017-02-20 DIAGNOSIS — S59911A Unspecified injury of right forearm, initial encounter: Secondary | ICD-10-CM | POA: Diagnosis present

## 2017-02-20 DIAGNOSIS — S5011XA Contusion of right forearm, initial encounter: Secondary | ICD-10-CM | POA: Diagnosis not present

## 2017-02-20 DIAGNOSIS — Y9301 Activity, walking, marching and hiking: Secondary | ICD-10-CM | POA: Insufficient documentation

## 2017-02-20 DIAGNOSIS — Y999 Unspecified external cause status: Secondary | ICD-10-CM | POA: Diagnosis not present

## 2017-02-20 DIAGNOSIS — F17210 Nicotine dependence, cigarettes, uncomplicated: Secondary | ICD-10-CM | POA: Insufficient documentation

## 2017-02-20 DIAGNOSIS — Y929 Unspecified place or not applicable: Secondary | ICD-10-CM | POA: Insufficient documentation

## 2017-02-20 MED ORDER — CYCLOBENZAPRINE HCL 10 MG PO TABS: 10.0000 mg | ORAL_TABLET | Freq: Two times a day (BID) | ORAL | 0 refills | Status: DC | PRN

## 2017-02-20 MED ORDER — IBUPROFEN 600 MG PO TABS: 600.0000 mg | ORAL_TABLET | Freq: Four times a day (QID) | ORAL | 0 refills | Status: DC | PRN

## 2017-02-20 MED ORDER — IBUPROFEN 800 MG PO TABS
800.0000 mg | ORAL_TABLET | Freq: Once | ORAL | Status: AC
  Administered 2017-02-20: 800 mg via ORAL
  Filled 2017-02-20: qty 1
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
## 2018-04-21 ENCOUNTER — Encounter (HOSPITAL_COMMUNITY): Payer: Self-pay | Admitting: Emergency Medicine

## 2018-04-21 ENCOUNTER — Ambulatory Visit (HOSPITAL_COMMUNITY)
Admission: EM | Admit: 2018-04-21 | Discharge: 2018-04-21 | Disposition: A | Discharge status: Home / Self Care | Payer: Medicaid Other | Attending: Family Medicine | Admitting: Family Medicine

## 2018-04-21 DIAGNOSIS — R369 Urethral discharge, unspecified: Secondary | ICD-10-CM

## 2018-04-21 MED ORDER — AZITHROMYCIN 250 MG PO TABS
1000.0000 mg | ORAL_TABLET | Freq: Once | ORAL | Status: AC
  Administered 2018-04-21: 1000 mg via ORAL

## 2018-04-21 MED ORDER — CEFTRIAXONE SODIUM 250 MG IJ SOLR
250.0000 mg | Freq: Once | INTRAMUSCULAR | Status: AC
  Administered 2018-04-21: 250 mg via INTRAMUSCULAR

## 2018-04-21 MED ORDER — CEFTRIAXONE SODIUM 250 MG IJ SOLR
INTRAMUSCULAR | Status: AC
  Filled 2018-04-21: qty 250

## 2018-04-21 MED ORDER — AZITHROMYCIN 250 MG PO TABS
ORAL_TABLET | ORAL | Status: AC
  Filled 2018-04-21: qty 4

## 2018-04-21 NOTE — ED Provider Notes (Signed)
[place]    CSN: [id number] Arrival date & time: 04/21/18  1524     History   Chief Complaint Chief Complaint  Patient presents with  . Dysuria    HPI Aryeh Butterfield is a 27 y.o. male.   27 year old male comes in for few day history of dysuria, penile discharge. Denies testicular pain/swelling, penile lesion/sore. States he has been sexually active with a new partner, no condom use.      Past Medical History:  Diagnosis Date  . DDD (degenerative disc disease), lumbar   . Gastric ulcer   . Herniated disc   . Larynx cancer [facility])     Patient Active Problem List   Diagnosis Date Noted  . Polysubstance (including opioids) dependence with physiol dependence ([place]) 03/25/2016  . GAD (generalized anxiety disorder) 06/30/2015    Past Surgical History:  Procedure Laterality Date  . WISDOM TOOTH EXTRACTION         Home Medications    Prior to Admission medications   Medication Sig Start Date End Date Taking? Authorizing Provider  cyclobenzaprine (FLEXERIL) 10 MG tablet Take 1 tablet (10 mg total) by mouth 2 (two) times daily as needed for muscle spasms. 02/20/17  Yes Domenic Moras, PA-C  gabapentin (NEURONTIN) 300 MG capsule Take 300 mg by mouth 3 (three) times daily.   Yes [provider]  hydrOXYzine (ATARAX/VISTARIL) 25 MG tablet Take 1 tablet (25 mg total) by mouth every 6 (six) hours as needed for anxiety. 03/25/16  Yes Patrecia Pour, NP  ibuprofen (ADVIL,MOTRIN) 600 MG tablet Take 1 tablet (600 mg total) by mouth every 6 (six) hours as needed. 02/20/17  Yes Domenic Moras, PA-C  ondansetron (ZOFRAN-ODT) 4 MG disintegrating tablet Take 1 tablet (4 mg total) by mouth every 8 (eight) hours as needed for nausea or vomiting. 07/01/15  Yes Niel Hummer, NP  propranolol (INDERAL) 20 MG tablet Take 20 mg by mouth 3 (three) times daily.   Yes [provider]    Family History Family History  Problem Relation Age of Onset  . Drug abuse Father      Social History Social History   Tobacco Use  . Smoking status: Current Every Day Smoker    Packs/day: 2.00    Types: Cigarettes  . Smokeless tobacco: Never Used  Substance Use Topics  . Alcohol use: No  . Drug use: Yes    Types: Marijuana, "Crack" cocaine    Comment: Smokes cannabis occasionally; started snorting coke at age 62 when his father introduced him to it. 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; abstinent since then     Allergies   Mustard seed; Prozac [fluoxetine hcl]; and Wellbutrin [bupropion]   Review of Systems Review of Systems  Reason unable to perform ROS: See HPI as above.     Physical Exam Triage Vital Signs ED Triage Vitals [04/21/18 1619]  Enc Vitals Group     BP 139/90     Pulse Rate 97     Resp 16     Temp 99 F (37.2 C)     Temp Source Oral     SpO2 100 %     Weight      Height      Head Circumference      Peak Flow      Pain Score 0     Pain Loc  Pain Edu?      Excl. in [place]?    No data found.  Updated Vital Signs BP 139/90   Pulse 97   Temp 99 F (37.2 C) (Oral)   Resp 16   SpO2 100%   Physical Exam  Constitutional: He is oriented to person, place, and time. He appears well-developed and well-nourished. No distress.  HENT:  Head: Normocephalic and atraumatic.  Eyes: Pupils are equal, round, and reactive to light. Conjunctivae are normal.  Neurological: He is alert and oriented to person, place, and time.  Skin: He is not diaphoretic.     UC Treatments / Results  Labs (all labs ordered are listed, but only abnormal results are displayed) Labs Reviewed  URINE CYTOLOGY ANCILLARY ONLY    EKG None   Radiology No results found.  Procedures Procedures (including critical care time)  Medications Ordered in UC Medications  azithromycin (ZITHROMAX) tablet 1,000 mg (1,000 mg Oral Given 04/21/18 1647)  cefTRIAXone (ROCEPHIN) injection 250 mg (250 mg  Intramuscular Given 04/21/18 1647)    Initial Impression / Assessment and Plan / UC Course  I have reviewed the triage vital signs and the nursing notes.  Pertinent labs & imaging results that were available during my care of the patient were reviewed by me and considered in my medical decision making (see chart for details).    Patient was treated empirically for GC. Azithromycin and Rocephin given in office today. Cytology sent, patient will be contacted with any positive results that require additional treatment. Patient to refrain from sexual activity for the next 7 days. Return precautions given.    Final Clinical Impressions(s) / UC Diagnoses   Final diagnoses:  Penile discharge    ED Prescriptions    None        Ok Edwards, PA-C 04/21/18 1702

## 2018-04-21 NOTE — Discharge Instructions (Signed)
You were treated empirically for gonorrhea, chlamydia. Azithromycin 1g by mouth and Rocephin 250mg  injection given in office today. Cytology sent, you will be contacted with any positive results that requires further treatment. Refrain from sexual activity and alcohol use for the next 7 days. Monitor for testicular swelling/pain, penile lesion/ulcer, follow up for reevaluation needed.

## 2018-04-21 NOTE — ED Triage Notes (Signed)
PT reports dysuria after a new sexual partner

## 2019-08-29 ENCOUNTER — Emergency Department (HOSPITAL_COMMUNITY)
Admission: EM | Admit: 2019-08-29 | Discharge: 2019-08-29 | Disposition: A | Discharge status: Home / Self Care | Payer: Medicaid Other | Attending: Emergency Medicine | Admitting: Emergency Medicine

## 2019-08-29 ENCOUNTER — Emergency Department (HOSPITAL_COMMUNITY): Payer: Medicaid Other

## 2019-08-29 ENCOUNTER — Other Ambulatory Visit: Payer: Self-pay

## 2019-08-29 ENCOUNTER — Encounter (HOSPITAL_COMMUNITY): Payer: Self-pay | Admitting: Emergency Medicine

## 2019-08-29 DIAGNOSIS — F17210 Nicotine dependence, cigarettes, uncomplicated: Secondary | ICD-10-CM | POA: Diagnosis not present

## 2019-08-29 DIAGNOSIS — Z79899 Other long term (current) drug therapy: Secondary | ICD-10-CM | POA: Diagnosis not present

## 2019-08-29 DIAGNOSIS — U071 COVID-19: Secondary | ICD-10-CM | POA: Insufficient documentation

## 2019-08-29 DIAGNOSIS — Z8521 Personal history of malignant neoplasm of larynx: Secondary | ICD-10-CM | POA: Diagnosis not present

## 2019-08-29 DIAGNOSIS — R509 Fever, unspecified: Secondary | ICD-10-CM | POA: Diagnosis present

## 2019-08-29 DIAGNOSIS — Z20822 Contact with and (suspected) exposure to covid-19: Secondary | ICD-10-CM

## 2019-08-29 MED ORDER — BENZONATATE 100 MG PO CAPS: 100.0000 mg | ORAL_CAPSULE | Freq: Three times a day (TID) | ORAL | 0 refills | Status: DC

## 2019-08-29 MED ORDER — DEXTROMETHORPHAN-GUAIFENESIN 5-100 MG/5ML PO SYRP: 5.0000 mL | ORAL_SOLUTION | Freq: Three times a day (TID) | ORAL | 0 refills | Status: DC | PRN

## 2019-08-29 NOTE — ED Triage Notes (Signed)
Patient presents with multiple complaints: headache , dry cough , chest congestion , poor appetite , fatigue , chills and mild SOB onset week .

## 2019-08-29 NOTE — Discharge Instructions (Signed)
Your COVID-19 test will result within the next 24 hours.  You can check the result on My Chart, link below.

## 2019-08-29 NOTE — ED Provider Notes (Signed)
[place] EMERGENCY DEPARTMENT Provider Note   CSN: [id number] Arrival date & time: 08/29/19  1909     History Chief Complaint  Patient presents with  . Headache/Cough/Chest Congestion    David Andrews is a 29 y.o. male.  The history is provided by the patient. No language interpreter was used.     29 year old male presenting with concerns of Covid infection.  Patient states he was recently released from jail approximately 2 weeks ago.  States towards the end of his time there, there was a Covid outbreak.  Within the past 2 to 3 days he has had subjective fever, chills, generalized body aches, throbbing headache, nonproductive cough, decreased appetite and congestion.  No report of loss of taste or smell.  No nausea vomiting or diarrhea or rash.  Does endorse some shortness of breath with cough.  He tries taking Tylenol and ibuprofen at home without adequate relief.  Cough is keeping up at night.  He has had minimal sleep due to his condition.  Past Medical History:  Diagnosis Date  . DDD (degenerative disc disease), lumbar   . Gastric ulcer   . Herniated disc   . Larynx cancer [facility])     Patient Active Problem List   Diagnosis Date Noted  . Polysubstance (including opioids) dependence with physiol dependence ([place]) 03/25/2016  . GAD (generalized anxiety disorder) 06/30/2015    Past Surgical History:  Procedure Laterality Date  . WISDOM TOOTH EXTRACTION         Family History  Problem Relation Age of Onset  . Drug abuse Father     Social History   Tobacco Use  . Smoking status: Current Every Day Smoker    Packs/day: 2.00    Types: Cigarettes  . Smokeless tobacco: Never Used  Substance Use Topics  . Alcohol use: No  . Drug use: Yes    Types: Marijuana, "Crack" cocaine    Comment: Smokes cannabis occasionally; started snorting coke at age 61 when his father introduced him to it. 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; quit 5 years  ago, fell off wagon 1 night 3 years ago; abstinent since then    Home Medications Prior to Admission medications   Medication Sig Start Date End Date Taking? Authorizing Provider  cyclobenzaprine (FLEXERIL) 10 MG tablet Take 1 tablet (10 mg total) by mouth 2 (two) times daily as needed for muscle spasms. 02/20/17   Domenic Moras, PA-C  gabapentin (NEURONTIN) 300 MG capsule Take 300 mg by mouth 3 (three) times daily.    [provider]  hydrOXYzine (ATARAX/VISTARIL) 25 MG tablet Take 1 tablet (25 mg total) by mouth every 6 (six) hours as needed for anxiety. 03/25/16   Patrecia Pour, NP  ibuprofen (ADVIL,MOTRIN) 600 MG tablet Take 1 tablet (600 mg total) by mouth every 6 (six) hours as needed. 02/20/17   Domenic Moras, PA-C  ondansetron (ZOFRAN-ODT) 4 MG disintegrating tablet Take 1 tablet (4 mg total) by mouth every 8 (eight) hours as needed for nausea or vomiting. 07/01/15   Niel Hummer, NP  propranolol (INDERAL) 20 MG tablet Take 20 mg by mouth 3 (three) times daily.    [provider]    Allergies    Mustard seed, Prozac [fluoxetine hcl], and Wellbutrin [bupropion]  Review of Systems   Review of Systems  All other systems reviewed and are negative.   Physical Exam Updated Vital Signs BP (!) 137/102 (BP Location: Right Arm)   Pulse Marland Kitchen)  108   Temp 99.2 F (37.3 C) (Oral)   Resp 18   Ht 5\' 11"  (1.803 m)   Wt 85 kg   SpO2 99%   BMI 26.14 kg/m   Physical Exam Vitals and nursing note reviewed.  Constitutional:      General: He is not in acute distress.    Appearance: He is well-developed.  HENT:     Head: Atraumatic.  Eyes:     Conjunctiva/sclera: Conjunctivae normal.  Cardiovascular:     Rate and Rhythm: Normal rate and regular rhythm.     Pulses: Normal pulses.     Heart sounds: Normal heart sounds.  Pulmonary:     Effort: Pulmonary effort is normal.     Breath sounds: Normal breath sounds.  Abdominal:     Palpations: Abdomen is soft.     Tenderness:  There is no abdominal tenderness.  Musculoskeletal:     Cervical back: Neck supple.  Skin:    Findings: No rash.  Neurological:     Mental Status: He is alert and oriented to person, place, and time.  Psychiatric:        Mood and Affect: Mood normal.     ED Results / Procedures / Treatments   Labs (all labs ordered are listed, but only abnormal results are displayed) Labs Reviewed  SARS CORONAVIRUS 2 (TAT 6-24 HRS)    EKG None  Radiology DG Chest 2 View  Result Date: 08/29/2019 CLINICAL DATA:  Cough, congestion, shortness of breath EXAM: CHEST - 2 VIEW COMPARISON:  11/24/2015 FINDINGS: The heart size and mediastinal contours are within normal limits. Both lungs are clear. The visualized skeletal structures are unremarkable. IMPRESSION: No acute abnormality of the lungs. Electronically Signed   By: Eddie Candle M.D.   On: 08/29/2019 19:54    Procedures Procedures (including critical care time)  Medications Ordered in ED Medications - No data to display  ED Course  I have reviewed the triage vital signs and the nursing notes.  Pertinent labs & imaging results that were available during my care of the patient were reviewed by me and considered in my medical decision making (see chart for details).    MDM Rules/Calculators/A&P                      BP (!) 137/102 (BP Location: Right Arm)   Pulse (!) 108   Temp 99.2 F (37.3 C) (Oral)   Resp 18   Ht 5\' 11"  (1.803 m)   Wt 85 kg   SpO2 99%   BMI 26.14 kg/m   Final Clinical Impression(s) / ED Diagnoses Final diagnoses:  Suspected COVID-19 virus infection    Rx / DC Orders ED Discharge Orders         Ordered    Dextromethorphan-guaiFENesin 5-100 MG/5ML SYRP  3 times daily PRN     08/29/19 2121    benzonatate (TESSALON) 100 MG capsule  Every 8 hours     08/29/19 2121         8:59 PM Patient here with symptoms suggestive of potential COVID-19 infection.  Will obtain Covid test.  Screening chest x-ray  unremarkable.  At this time patient does not require to be admitted to the hospital as he is not hypoxic.  David Andrews was evaluated in Emergency Department on 08/29/2019 for the symptoms described in the history of present illness. He was evaluated in the context of the global COVID-19 pandemic, which necessitated consideration that the patient  might be at risk for infection with the SARS-CoV-2 virus that causes COVID-19. Institutional protocols and algorithms that pertain to the evaluation of patients at risk for COVID-19 are in a state of rapid change based on information released by regulatory bodies including the CDC and federal and state organizations. These policies and algorithms were followed during the patient's care in the ED.    Domenic Moras, PA-C 08/29/19 2124    Tegeler, Gwenyth Allegra, MD 08/30/19 [phone]

## 2019-08-30 LAB — SARS CORONAVIRUS 2 (TAT 6-24 HRS): SARS Coronavirus 2: POSITIVE — AB

## 2019-10-04 ENCOUNTER — Emergency Department (HOSPITAL_COMMUNITY)
Admission: EM | Admit: 2019-10-04 | Discharge: 2019-10-04 | Discharge status: Against Medical Advice | Payer: BLUE CROSS/BLUE SHIELD

## 2019-10-04 ENCOUNTER — Other Ambulatory Visit: Payer: Self-pay

## 2019-10-04 NOTE — ED Notes (Signed)
Called pts name 3x for triage with no response. Will try again in a few minutes.

## 2020-02-01 ENCOUNTER — Emergency Department (HOSPITAL_COMMUNITY)
Admission: EM | Admit: 2020-02-01 | Discharge: 2020-02-01 | Disposition: A | Discharge status: Home / Self Care | Payer: Medicaid Other | Attending: Emergency Medicine | Admitting: Emergency Medicine

## 2020-02-01 ENCOUNTER — Other Ambulatory Visit: Payer: Self-pay

## 2020-02-01 ENCOUNTER — Encounter (HOSPITAL_COMMUNITY): Payer: Self-pay | Admitting: *Deleted

## 2020-02-01 DIAGNOSIS — Z5900 Homelessness unspecified: Secondary | ICD-10-CM

## 2020-02-01 DIAGNOSIS — S90822A Blister (nonthermal), left foot, initial encounter: Secondary | ICD-10-CM

## 2020-02-01 DIAGNOSIS — F1910 Other psychoactive substance abuse, uncomplicated: Secondary | ICD-10-CM | POA: Diagnosis not present

## 2020-02-01 DIAGNOSIS — F1290 Cannabis use, unspecified, uncomplicated: Secondary | ICD-10-CM | POA: Diagnosis not present

## 2020-02-01 DIAGNOSIS — F17210 Nicotine dependence, cigarettes, uncomplicated: Secondary | ICD-10-CM | POA: Diagnosis not present

## 2020-02-01 DIAGNOSIS — Z8521 Personal history of malignant neoplasm of larynx: Secondary | ICD-10-CM | POA: Diagnosis not present

## 2020-02-01 DIAGNOSIS — S90821A Blister (nonthermal), right foot, initial encounter: Secondary | ICD-10-CM

## 2020-02-01 MED ORDER — HYDROXYZINE HCL 25 MG PO TABS
25.0000 mg | ORAL_TABLET | Freq: Once | ORAL | Status: AC
  Administered 2020-02-01: 25 mg via ORAL
  Filled 2020-02-01: qty 1

## 2020-02-01 NOTE — ED Notes (Signed)
Provided pt with a basin filled with warm water and soap to wash his feet and put on clean socks!

## 2020-02-01 NOTE — ED Notes (Signed)
Pt called to go back to exam room. No response.

## 2020-02-01 NOTE — ED Notes (Signed)
Pt given food and drink. Waiting on peer support to come speak with pt.

## 2020-02-01 NOTE — Patient Outreach (Signed)
ED Peer Support Specialist Patient Intake (Complete at intake & 30-60 Day Follow-up)  Name: David Andrews  MRN: [id number]  Age: 29 y.o.   Date of Admission: 02/01/2020  Intake: Initial Comments:      Primary Reason Admitted: Substance abuse (   Lab values: Alcohol/ETOH: Not completed Positive UDS? Yes Amphetamines: Yes Barbiturates: No Benzodiazepines: Yes Cocaine: No Opiates: No Cannabinoids: Yes  Demographic information: Gender: Male Ethnicity: White Marital Status: Single Insurance Status: Diplomatic Services operational officer (Work Neurosurgeon, Physicist, medical, etc.: Yes Lives with: Alone Living situation: House/Apartment  Reported Patient History: Patient reported health conditions: Anxiety disorders Patient aware of HIV and hepatitis status: No  In past year, has patient visited ED for any reason? Yes  Number of ED visits: 2  Reason(s) for visit: various reasons  In past year, has patient been hospitalized for any reason? No  Number of hospitalizations:    Reason(s) for hospitalization:    In past year, has patient been arrested? Yes  Number of arrests: 1  Reason(s) for arrest: unknown  In past year, has patient been incarcerated? Yes  Number of incarcerations: 1  Reason(s) for incarceration:    In past year, has patient received medication-assisted treatment? No  In past year, patient received the following treatments:    In past year, has patient received any harm reduction services? No  Did this include any of the following?    In past year, has patient received care from a mental health provider for diagnosis other than SUD? No  In past year, is this first time patient has overdosed? No  Number of past overdoses:    In past year, is this first time patient has been hospitalized for an overdose? No  Number of hospitalizations for overdose(s):    Is patient currently receiving treatment for a mental health diagnosis?  No  Patient reports experiencing difficulty participating in SUD treatment: No    Most important reason(s) for this difficulty?    Has patient received prior services for treatment? No  In past, patient has received services from following agencies:    Plan of Care:  Suggested follow up at these agencies/treatment centers: Other (comment)  Other information:  [facility]PSS met with Pt an was made aware that Pt was seeking treatment an was set up to go to [facility] an or [facility] in [place]. CPSS was able to complete series of question to better assist Pt with services. CPSS contacted [facility] an was able to speak with intake an complete the process for him being accepted. CPSS contacted safe transport to assist Pt to facility. CPSS left contact information for Pt to follow up with CPSS in the community for additional support.    Aaron Edelman Alper Guilmette, CPSS  02/01/2020 2:36 PM

## 2020-02-01 NOTE — ED Provider Notes (Signed)
[place] EMERGENCY DEPARTMENT Provider Note   CSN: [id number] Arrival date & time: 02/01/20  0230     History Chief Complaint  Patient presents with  . Homeless    David Andrews is a 29 y.o. male.  Patient c/o blisters to feet, and also being homeless and trying to get into program for substance abuse. States recently released from jail. Hx substance abuse, states he has been talking to [facility] and other programs. Denies drug or alcohol use today. Denies hx daily etoh use/abuse or complicated withdrawal. States physical health at baseline with exception blisters to bilateral heels from walking quite a bit (has shoes, no socks). Denies fever or chills. No cp or sob. No abd pain or nvd. Denies depression, or thoughts of harm to self or others. Does appear anxious.   The history is provided by the patient.       Past Medical History:  Diagnosis Date  . DDD (degenerative disc disease), lumbar   . Gastric ulcer   . Herniated disc   . Larynx cancer [facility] [place])     Patient Active Problem List   Diagnosis Date Noted  . Polysubstance (including opioids) dependence with physiol dependence ([place]) 03/25/2016  . GAD (generalized anxiety disorder) 06/30/2015    Past Surgical History:  Procedure Laterality Date  . WISDOM TOOTH EXTRACTION         Family History  Problem Relation Age of Onset  . Drug abuse Father     Social History   Tobacco Use  . Smoking status: Current Every Day Smoker    Packs/day: 2.00    Types: Cigarettes  . Smokeless tobacco: Never Used  Vaping Use  . Vaping Use: Never used  Substance Use Topics  . Alcohol use: No  . Drug use: Yes    Types: Marijuana, "Crack" cocaine    Comment: Smokes cannabis occasionally; started snorting coke at age 51 when his father introduced him to it. 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; abstinent since then    Home Medications Prior to  Admission medications   Medication Sig Start Date End Date Taking? Authorizing Provider  benzonatate (TESSALON) 100 MG capsule Take 1 capsule (100 mg total) by mouth every 8 (eight) hours. 08/29/19   Domenic Moras, PA-C  cyclobenzaprine (FLEXERIL) 10 MG tablet Take 1 tablet (10 mg total) by mouth 2 (two) times daily as needed for muscle spasms. 02/20/17   Domenic Moras, PA-C  Dextromethorphan-guaiFENesin 5-100 MG/5ML SYRP Take 5 mLs by mouth 3 (three) times daily as needed (cough and congestion). 08/29/19   Domenic Moras, PA-C  hydrOXYzine (ATARAX/VISTARIL) 25 MG tablet Take 1 tablet (25 mg total) by mouth every 6 (six) hours as needed for anxiety. 03/25/16   Patrecia Pour, NP  ibuprofen (ADVIL,MOTRIN) 600 MG tablet Take 1 tablet (600 mg total) by mouth every 6 (six) hours as needed. 02/20/17   Domenic Moras, PA-C  ondansetron (ZOFRAN-ODT) 4 MG disintegrating tablet Take 1 tablet (4 mg total) by mouth every 8 (eight) hours as needed for nausea or vomiting. 07/01/15   Niel Hummer, NP    Allergies    Mustard seed, Prozac [fluoxetine hcl], and Wellbutrin [bupropion]  Review of Systems   Review of Systems  Constitutional: Negative for fever.  HENT: Negative for sore throat.   Eyes: Negative for redness.  Respiratory: Negative for cough and shortness of breath.   Cardiovascular: Negative for chest  pain.  Gastrointestinal: Negative for abdominal pain, diarrhea and vomiting.  Genitourinary: Negative for flank pain.  Musculoskeletal: Negative for back pain and neck pain.  Skin: Negative for rash.  Neurological: Negative for headaches.  Hematological: Does not bruise/bleed easily.  Psychiatric/Behavioral: Negative for confusion.    Physical Exam Updated Vital Signs BP (!) 133/15 (BP Location: Right Arm)   Pulse (!) 106   Temp 98 F (36.7 C) (Oral)   Resp 17   Ht 1.803 m (5\' 11" )   Wt 68 kg   SpO2 100%   BMI 20.92 kg/m   Physical Exam Vitals and nursing note reviewed.  Constitutional:       Appearance: Normal appearance. He is well-developed.  HENT:     Head: Atraumatic.     Nose: Nose normal.     Mouth/Throat:     Mouth: Mucous membranes are moist.  Eyes:     General: No scleral icterus.    Conjunctiva/sclera: Conjunctivae normal.     Pupils: Pupils are equal, round, and reactive to light.  Neck:     Trachea: No tracheal deviation.  Cardiovascular:     Rate and Rhythm: Normal rate and regular rhythm.     Pulses: Normal pulses.     Heart sounds: Normal heart sounds. No murmur heard.  No friction rub. No gallop.   Pulmonary:     Effort: Pulmonary effort is normal. No accessory muscle usage or respiratory distress.     Breath sounds: Normal breath sounds.  Abdominal:     General: Bowel sounds are normal. There is no distension.     Palpations: Abdomen is soft.     Tenderness: There is no abdominal tenderness.  Genitourinary:    Comments: No cva tenderness. Musculoskeletal:        General: No swelling.     Cervical back: Normal range of motion and neck supple. No rigidity.     Comments: Pt with intact blister to posterior aspect of both right and left heels, ~1 by 2 cm. No cellulitis or infection noted. Distal pulses palp.   Skin:    General: Skin is warm and dry.     Findings: No rash.  Neurological:     Mental Status: He is alert.     Comments: Alert, speech clear. Steady gait. No tremor or shakes.   Psychiatric:     Comments: Appears mildly anxious, and occasionally agitated. No SI/HI. No delusions or hallucinations.      ED Results / Procedures / Treatments   Labs (all labs ordered are listed, but only abnormal results are displayed) Labs Reviewed - No data to display  EKG None  Radiology No results found.  Procedures Procedures (including critical care time)  Medications Ordered in ED Medications  hydrOXYzine (ATARAX/VISTARIL) tablet 25 mg (has no administration in time range)    ED Course  I have reviewed the triage vital signs and the  nursing notes.  Pertinent labs & imaging results that were available during my care of the patient were reviewed by me and considered in my medical decision making (see chart for details).    MDM Rules/Calculators/A&P                         Feet cleaned. Socks/footies. Also advised may use OTC blister pads.   Pt given vistaril po for anxiety.   Reviewed nursing notes and prior charts for additional history.   Will provide resource guide for additional rehab/treatment facility  resources, as well as shelter and social resources.   Pt currently appears stable for d/c.   Final Clinical Impression(s) / ED Diagnoses Final diagnoses:  None    Rx / DC Orders ED Discharge Orders    None       Lajean Saver, MD 02/01/20 1312

## 2020-02-01 NOTE — Discharge Instructions (Addendum)
It was our pleasure to provide your ER care today - we hope that you feel better.  Keep feet very clean and dry. Wear dry socks. Consider use 'Foot Glide' balm, or other similar product to help prevent blisters - several over the counter options are available.   Pursue substance abuse rehab/treatment - see resource guide provided with additional community resources for substance abuse, counseling, and social services.   Also follow up with primary care doctor in the next few weeks.   For mental health issues and/or crisis, go directly to the [facility], it is open 24/7.   Return to ER if worse, new symptoms, fevers, infection of areas, severe pain, spreading redness, or other concern.

## 2020-02-01 NOTE — ED Triage Notes (Signed)
The pt arrived by [facility]  hes homeless em[facility] was called because he reports that his feet and legs were sore from walking  He was also just released from jail 5 days ago he uses crystal meth he has bug bites on both legs and feet he also reports that he needs a place to stay

## 2020-04-01 ENCOUNTER — Other Ambulatory Visit: Payer: Self-pay

## 2020-04-01 ENCOUNTER — Ambulatory Visit (HOSPITAL_COMMUNITY)
Admission: EM | Admit: 2020-04-01 | Discharge: 2020-04-01 | Disposition: A | Discharge status: Home / Self Care | Payer: Medicaid Other | Attending: Psychiatry | Admitting: Psychiatry

## 2020-04-01 DIAGNOSIS — Z9151 Personal history of suicidal behavior: Secondary | ICD-10-CM | POA: Insufficient documentation

## 2020-04-01 DIAGNOSIS — F331 Major depressive disorder, recurrent, moderate: Secondary | ICD-10-CM

## 2020-04-01 DIAGNOSIS — Z634 Disappearance and death of family member: Secondary | ICD-10-CM | POA: Insufficient documentation

## 2020-04-01 DIAGNOSIS — F339 Major depressive disorder, recurrent, unspecified: Secondary | ICD-10-CM | POA: Insufficient documentation

## 2020-04-01 DIAGNOSIS — F1911 Other psychoactive substance abuse, in remission: Secondary | ICD-10-CM | POA: Insufficient documentation

## 2020-04-01 DIAGNOSIS — F411 Generalized anxiety disorder: Secondary | ICD-10-CM

## 2020-04-01 DIAGNOSIS — Z8659 Personal history of other mental and behavioral disorders: Secondary | ICD-10-CM | POA: Insufficient documentation

## 2020-04-01 MED ORDER — ARIPIPRAZOLE 5 MG PO TABS: 5.0000 mg | ORAL_TABLET | Freq: Every day | ORAL | 0 refills | Status: DC

## 2020-04-01 MED ORDER — MIRTAZAPINE 15 MG PO TABS: 15.0000 mg | ORAL_TABLET | Freq: Every day | ORAL | 0 refills | Status: DC

## 2020-04-01 MED ORDER — GABAPENTIN 300 MG PO CAPS: 300.0000 mg | ORAL_CAPSULE | Freq: Three times a day (TID) | ORAL | 0 refills | Status: DC

## 2020-04-01 MED FILL — GABAPENTIN 300 MG CAPSULE: 300 | 30 days supply | Qty: 90 | Fill #0

## 2020-04-01 MED FILL — MIRTAZAPINE 15 MG TABLET: 15 | 30 days supply | Qty: 30 | Fill #0

## 2020-04-01 MED FILL — ARIPiprazole 5 MG TABS: 5 | 30 days supply | Qty: 30 | Fill #0

## 2020-04-01 NOTE — ED Notes (Signed)
Patient belongings in locker #30

## 2020-04-01 NOTE — Discharge Instructions (Addendum)
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
## 2020-04-25 ENCOUNTER — Telehealth (HOSPITAL_COMMUNITY): Payer: Self-pay | Admitting: *Deleted

## 2020-04-25 ENCOUNTER — Telehealth (HOSPITAL_COMMUNITY): Payer: Self-pay | Admitting: Psychiatry

## 2020-04-25 ENCOUNTER — Other Ambulatory Visit (HOSPITAL_COMMUNITY): Payer: Self-pay | Admitting: Psychiatry

## 2020-04-25 MED ORDER — MIRTAZAPINE 15 MG PO TABS: 15.0000 mg | ORAL_TABLET | Freq: Every day | ORAL | 0 refills | Status: DC

## 2020-04-25 MED ORDER — GABAPENTIN 300 MG PO CAPS: 300.0000 mg | ORAL_CAPSULE | Freq: Three times a day (TID) | ORAL | 0 refills | Status: DC

## 2020-04-25 MED ORDER — ARIPIPRAZOLE 5 MG PO TABS: 5.0000 mg | ORAL_TABLET | Freq: Every day | ORAL | 0 refills | Status: DC

## 2020-04-25 NOTE — Telephone Encounter (Signed)
Medications refilled and sent to preferred pharmacy.

## 2020-04-25 NOTE — Telephone Encounter (Signed)
Walk in seeking meds. He has an appt here for the first time on 11/5 with Eddie. He will be out of meds by then and states he is in a program and cant be without meds in it. Will consult with Ms Ronne Binning NP and see if she would be willing to call Rx in for him. He has Medicaid. He is not on any controlled drugs. He should have meds till 11/1.

## 2020-05-06 ENCOUNTER — Ambulatory Visit (HOSPITAL_COMMUNITY): Payer: Medicaid Other | Admitting: Physician Assistant

## 2020-05-18 ENCOUNTER — Other Ambulatory Visit (HOSPITAL_COMMUNITY): Payer: Self-pay | Admitting: Psychiatry

## 2021-06-17 ENCOUNTER — Encounter (HOSPITAL_BASED_OUTPATIENT_CLINIC_OR_DEPARTMENT_OTHER): Payer: Self-pay | Admitting: *Deleted

## 2021-06-17 ENCOUNTER — Emergency Department (HOSPITAL_BASED_OUTPATIENT_CLINIC_OR_DEPARTMENT_OTHER)
Admission: EM | Admit: 2021-06-17 | Discharge: 2021-06-17 | Disposition: A | Discharge status: Home / Self Care | Payer: Medicaid Other | Attending: Emergency Medicine | Admitting: Emergency Medicine

## 2021-06-17 ENCOUNTER — Other Ambulatory Visit: Payer: Self-pay

## 2021-06-17 DIAGNOSIS — Z8521 Personal history of malignant neoplasm of larynx: Secondary | ICD-10-CM | POA: Diagnosis not present

## 2021-06-17 DIAGNOSIS — K0889 Other specified disorders of teeth and supporting structures: Secondary | ICD-10-CM | POA: Insufficient documentation

## 2021-06-17 DIAGNOSIS — F17210 Nicotine dependence, cigarettes, uncomplicated: Secondary | ICD-10-CM | POA: Diagnosis not present

## 2021-06-17 DIAGNOSIS — L731 Pseudofolliculitis barbae: Secondary | ICD-10-CM | POA: Diagnosis not present

## 2021-06-17 DIAGNOSIS — L738 Other specified follicular disorders: Secondary | ICD-10-CM

## 2021-06-17 DIAGNOSIS — R070 Pain in throat: Secondary | ICD-10-CM | POA: Diagnosis present

## 2021-06-17 MED ORDER — MUPIROCIN CALCIUM 2 % EX CREA: 1.0000 "application " | TOPICAL_CREAM | Freq: Two times a day (BID) | CUTANEOUS | 0 refills | Status: DC

## 2021-06-17 MED ORDER — DOXYCYCLINE HYCLATE 100 MG PO CAPS: 100.0000 mg | ORAL_CAPSULE | Freq: Two times a day (BID) | ORAL | 0 refills | Status: DC

## 2021-06-17 NOTE — ED Triage Notes (Signed)
Presents from [facility] stating he feels like his glands are swollen and has a rash on his face.

## 2021-06-17 NOTE — ED Provider Notes (Signed)
[place] EMERGENCY DEPARTMENT Provider Note   CSN: [id number] Arrival date & time: 06/17/21  0831     History Chief Complaint  Patient presents with   Sore Throat    David Andrews is a 30 y.o. male.  He is here with a complaint of facial rash and swollen glands in his neck.  Also thinks he has a bad tooth.  Minimal sore throat.  No fevers.  He said he remotely had a growth on his larynx that required surgery.  This was at [facility].  No history of MRSA.  Currently a resident at [facility] for the last week.  The history is provided by the patient.  Sore Throat Pertinent negatives include no abdominal pain and no shortness of breath.  Rash Location:  Face Quality: painful and redness   Pain details:    Severity:  Moderate   Onset quality:  Gradual   Timing:  Constant   Progression:  Unchanged Progression:  Unchanged Chronicity:  New Relieved by:  None tried Worsened by:  Nothing Ineffective treatments:  None tried Associated symptoms: no abdominal pain, no fever, no shortness of breath, no throat swelling and no tongue swelling       Past Medical History:  Diagnosis Date   DDD (degenerative disc disease), lumbar    Gastric ulcer    Herniated disc    Larynx cancer [facility])     Patient Active Problem List   Diagnosis Date Noted   Polysubstance (including opioids) dependence with physiol dependence ([address]) 03/25/2016   GAD (generalized anxiety disorder) 06/30/2015    Past Surgical History:  Procedure Laterality Date   WISDOM TOOTH EXTRACTION         Family History  Problem Relation Age of Onset   Drug abuse Father     Social History   Tobacco Use   Smoking status: Every Day    Packs/day: 2.00    Types: Cigarettes   Smokeless tobacco: Never  Vaping Use   Vaping Use: Never used  Substance Use Topics   Alcohol use: No   Drug use: Yes    Types: Marijuana, "Crack" cocaine    Comment: Smokes cannabis occasionally; started snorting coke at age 69 when  his father introduced him to it. 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; abstinent since then    Home Medications Prior to Admission medications   Medication Sig Start Date End Date Taking? Authorizing Provider  ARIPiprazole (ABILIFY) 5 MG tablet TAKE 1 TABLET BY MOUTH EVERY DAY 05/18/20   Eulis Canner E, NP  gabapentin (NEURONTIN) 300 MG capsule Take 1 capsule (300 mg total) by mouth 3 (three) times daily. 04/25/20 05/25/20  Salley Slaughter, NP  mirtazapine (REMERON) 15 MG tablet TAKE 1 TABLET BY MOUTH EVERYDAY AT BEDTIME 05/18/20   Salley Slaughter, NP    Allergies    Mustard seed, Prozac [fluoxetine hcl], and Wellbutrin [bupropion]  Review of Systems   Review of Systems  Constitutional:  Negative for fever.  HENT:  Positive for dental problem.   Eyes:  Negative for visual disturbance.  Respiratory:  Negative for shortness of breath.   Gastrointestinal:  Negative for abdominal pain.  Skin:  Positive for rash.  Neurological:  Negative for syncope.   Physical Exam Updated Vital Signs BP 127/81 (BP Location: Right Arm)    Pulse 100    Temp 98 F (36.7 C) (Oral)    Resp  18    Ht 5\' 10"  (1.778 m)    Wt 81.6 kg    SpO2 99%    BMI 25.83 kg/m   Physical Exam Vitals and nursing note reviewed.  Constitutional:      Appearance: He is well-developed.  HENT:     Head: Normocephalic and atraumatic.     Mouth/Throat:     Mouth: Mucous membranes are moist. No oral lesions.     Pharynx: Oropharynx is clear.     Tonsils: No tonsillar exudate or tonsillar abscesses.  Eyes:     Conjunctiva/sclera: Conjunctivae normal.  Cardiovascular:     Rate and Rhythm: Normal rate and regular rhythm.  Pulmonary:     Effort: Pulmonary effort is normal.  Musculoskeletal:     Cervical back: Neck supple.  Skin:    General: Skin is warm and dry.     Findings: Rash present.     Comments: Patient has  numerous pustules on face and  hair growing areas along with some cracked lips.  No intraoral lesions.  Poor dentition.  No obvious facial abscess.  Does have moderate tender cervical lymphadenopathy.  Neurological:     General: No focal deficit present.     Mental Status: He is alert.     GCS: GCS eye subscore is 4. GCS verbal subscore is 5. GCS motor subscore is 6.    ED Results / Procedures / Treatments   Labs (all labs ordered are listed, but only abnormal results are displayed) Labs Reviewed - No data to display  EKG None  Radiology No results found.  Procedures Procedures   Medications Ordered in ED Medications - No data to display  ED Course  I have reviewed the triage vital signs and the nursing notes.  Pertinent labs & imaging results that were available during my care of the patient were reviewed by me and considered in my medical decision making (see chart for details).    MDM Rules/Calculators/A&P                         Differential diagnosis includes strep, COVID, folliculitis, contact dermatitis, lymphadenopathy    Final Clinical Impression(s) / ED Diagnoses Final diagnoses:  Folliculitis barbae  Pain, dental    Rx / DC Orders ED Discharge Orders          Ordered    doxycycline (VIBRAMYCIN) 100 MG capsule  2 times daily        06/17/21 0929    mupirocin cream (BACTROBAN) 2 %  2 times daily        06/17/21 0929             Hayden Rasmussen, MD 06/17/21 1705

## 2021-06-17 NOTE — Discharge Instructions (Signed)
You are seen in the emergency department for facial rash sore throat dental pain and swollen lymph nodes.  We are starting you on some antibiotics for a skin infection.  There is also a topical cream.  Avoid shaving these areas.  Return to the emergency department if any worsening or concerning symptoms

## 2021-06-27 ENCOUNTER — Other Ambulatory Visit: Payer: Self-pay

## 2021-06-27 ENCOUNTER — Ambulatory Visit: Payer: Medicaid Other | Admitting: Physician Assistant

## 2021-06-27 VITALS — BP 123/80 | HR 84 | Temp 98.2°F | Resp 18 | Ht 69.0 in

## 2021-06-27 DIAGNOSIS — F1110 Opioid abuse, uncomplicated: Secondary | ICD-10-CM

## 2021-06-27 DIAGNOSIS — B182 Chronic viral hepatitis C: Secondary | ICD-10-CM

## 2021-06-27 DIAGNOSIS — Z114 Encounter for screening for human immunodeficiency virus [HIV]: Secondary | ICD-10-CM

## 2021-06-27 DIAGNOSIS — F1010 Alcohol abuse, uncomplicated: Secondary | ICD-10-CM | POA: Diagnosis not present

## 2021-06-27 DIAGNOSIS — F3170 Bipolar disorder, currently in remission, most recent episode unspecified: Secondary | ICD-10-CM

## 2021-06-27 DIAGNOSIS — M159 Polyosteoarthritis, unspecified: Secondary | ICD-10-CM

## 2021-06-27 DIAGNOSIS — Z13220 Encounter for screening for lipoid disorders: Secondary | ICD-10-CM

## 2021-06-27 MED ORDER — GABAPENTIN 300 MG PO CAPS: 300.0000 mg | ORAL_CAPSULE | Freq: Every day | ORAL | 1 refills | Status: DC

## 2021-06-27 MED ORDER — MIRTAZAPINE 15 MG PO TABS: 15.0000 mg | ORAL_TABLET | Freq: Every day | ORAL | 1 refills | Status: DC

## 2021-06-27 MED ORDER — ARIPIPRAZOLE 5 MG PO TABS: 5.0000 mg | ORAL_TABLET | Freq: Every day | ORAL | 1 refills | Status: DC

## 2021-06-27 NOTE — Patient Instructions (Signed)
You are going to restart taking Abilify 5 mg once daily, Remeron 15 mg at bedtime, and gabapentin 300 mg at bedtime.  I have started a referral for you to be seen by infectious disease for hepatitis C treatment.  The name of the 2 tests that I discussed were GeneSight and Geno mind.  We will call you with today's lab results.  Please return to the mobile medicine unit in 2 weeks if you have not been seen by another medical provider.  Kennieth Rad, PA-C Physician Assistant [facility] [email or web address]   Hepatitis C Hepatitis C is a liver infection that is caused by the hepatitis C virus (HCV). The virus infects and causes inflammation in the liver. Hepatitis C can lead to: Loss of liver function (liver failure). Scarring of the liver (cirrhosis). Liver cancer. People with hepatitis C often do not know for months or years that they have this condition. This is because they have no symptoms or may have only mild symptoms. What are the causes? This condition is caused by HCV. The virus can spread from person to person (is contagious). It can spread through: Contact with an infected person's blood, semen, or vaginal fluids. Childbirth. A woman who has hepatitis C can pass it to her baby during birth. Having received donated blood (blood transfusion) or an organ transplant that was done in the [place] before 1992. What increases the risk? The following factors may make you more likely to develop this condition: Having contact with needles or syringes that have HCV on them (are contaminated). This may happen while injecting drugs, getting a tattoo or body piercing, or receiving acupuncture. Acupuncture is a treatment that inserts thin needles through your skin. Contact also may happen when you: Have sex with someone who is infected. The virus can spread through vaginal, oral, or anal sex. Receive treatment to filter your blood  (kidney dialysis). Have a job that involves contact with blood or body fluids, such as in health care. Having HIV (human immunodeficiency virus) or AIDS (acquired immunodeficiency syndrome). What are the signs or symptoms? Symptoms of this condition include: Tiredness (fatigue). Loss of appetite. Nausea or vomiting. Pain in your abdomen. Dark yellow urine. Yellowing of your skin or the white parts of your eyes (jaundice). Itchy skin. Light-colored or tan stool. Joint pain. Bleeding and bruising that happen often. Fluid buildup in your stomach (ascites). Often, hepatitis C causes no symptoms. How is this diagnosed? This condition is diagnosed with: Blood tests. Other tests that show how well your liver is working. These tests may include: Magnetic resonance elastography (MRE). This imaging test uses MRI and sound waves to measure liver stiffness. Transient elastography. This imaging test uses ultrasound to measure liver stiffness. Liver biopsy. In this test, a tissue sample is taken from your liver and looked at under a microscope. How is this treated? Treatment may depend on how severe your condition is, how long it has lasted, and whether you have liver damage. Treatment may include: Taking antiviral medicines and other medicines. Having follow-up treatments every 6-12 months for infections or other liver problems. Having a liver transplant. Follow these instructions at home: Medicines Take over-the-counter and prescription medicines only as told by your health care provider. If you were prescribed an antiviral medicine, take it as told by your health care provider. Do not stop using the antiviral even if you start to feel better. Do not take any new medicines, including over-the-counter medicines or supplements, unless your health  care provider approves. Activity Rest as needed. Do not have sex unless your health care provider approves. Avoid swimming or using hot tubs if you  have open sores or wounds. Return to your normal activities as told by your health care provider. Ask your health care provider what activities are safe for you. Ask your health care provider when you may return to school or work. Eating and drinking  Eat a balanced diet with plenty of fruits and vegetables, whole grains, and lean meats or non-meat proteins, such as beans or tofu. Drink enough fluid to keep your urine pale yellow. Do not drink alcohol. General instructions Do not share toothbrushes, nail clippers, or razors. Wash your hands often with soap and water for at least 20 seconds. If soap and water are not available, use alcohol-based hand sanitizer. Cover any cuts or open sores on your skin to prevent spreading HCV. Keep all follow-up visits. This is important. You may need follow-up visits every 6-12 months. How is this prevented? There is no vaccine for hepatitis C. You can lessen your risk of coming into contact with HCV by making sure you: Wash your hands often with soap and water for at least 20 seconds. Do not share needles or syringes. Use a condom every time you have vaginal, oral, or anal sex. Latex condoms offer the best protection. Avoid handling blood or other body fluids without gloves or other protection. Avoid getting tattoos or body piercings in shops that are not clean. Where to find more information Centers for Disease Control and Prevention: InternetEnthusiasts.hu World Health Organization: RoleLink.com.br Contact a health care provider if you: Have a fever or chills. Have pain or swelling in your abdomen. Pass dark urine. Pass light-colored or tan stool. Have joint pain. Get help right away if you: Have more fatigue. Lose your appetite. Cannot eat or drink without vomiting. Develop jaundice, or your jaundice gets worse. Bruise or bleed easily. Summary Hepatitis C is a liver infection that is caused by the hepatitis C virus (HCV). This infection can lead  to a loss of liver function (liver failure), scarring of the liver (cirrhosis), or liver cancer. HCV can spread from person to person (is contagious). Do not take any medicines, including over-the-counter medicines or supplements, unless your health care provider approves. This information is not intended to replace advice given to you by your health care provider. Make sure you discuss any questions you have with your health care provider. Document Revised: 05/05/2020 Document Reviewed: 05/05/2020 Elsevier Patient Education  2022 [employer].

## 2021-06-27 NOTE — Progress Notes (Signed)
New Patient Office Visit  Subjective:  Patient ID: David Andrews, male    DOB: 1990-09-15  Age: 30 y.o. MRN: [id number]  CC:  Chief Complaint  Patient presents with   Medication Refill    HPI David Andrews reports that he is currently being treated for substance abuse at [facility] residential treatment center, states that he has been there for the past 3 weeks and does plan to transition to caring services in [facility] in the next couple of weeks.  Reports that he was at [facility] for 2 months prior to presenting to [facility].  States that while he was in jail, he was treated for his bipolar disorder with Abilify, Remeron, and gabapentin.  Reports that he uses lifestyle modifications to help with his degenerative joint disease, which mostly affects his back.  Reports that he has not previously been treated for hepatitis C.   Past Medical History:  Diagnosis Date   DDD (degenerative disc disease), lumbar    Gastric ulcer    Herniated disc    Larynx cancer (HCC)     Past Surgical History:  Procedure Laterality Date   WISDOM TOOTH EXTRACTION      Family History  Problem Relation Age of Onset   Drug abuse Father     Social History   Socioeconomic History   Marital status: Single    Spouse name: Not on file   Number of children: Not on file   Years of education: Not on file   Highest education level: Not on file  Occupational History   Not on file  Tobacco Use   Smoking status: Every Day    Packs/day: 2.00    Types: Cigarettes   Smokeless tobacco: Never  Vaping Use   Vaping Use: Never used  Substance and Sexual Activity   Alcohol use: No   Drug use: Yes    Types: Marijuana, "Crack" cocaine    Comment: Smokes cannabis occasionally; started snorting coke at age 33 when his father introduced him to it. 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; abstinent since then   Sexual activity: Yes   Other Topics Concern   Not on file  Social History Narrative   Not on file   Social Determinants of Health   Financial Resource Strain: Not on file  Food Insecurity: Not on file  Transportation Needs: Not on file  Physical Activity: Not on file  Stress: Not on file  Social Connections: Not on file  Intimate Partner Violence: Not on file    ROS Review of Systems  Constitutional: Negative.   HENT: Negative.    Eyes: Negative.   Respiratory:  Negative for shortness of breath.   Cardiovascular:  Negative for chest pain.  Gastrointestinal: Negative.   Endocrine: Negative.   Musculoskeletal: Negative.   Skin: Negative.   Allergic/Immunologic: Negative.   Neurological: Negative.   Hematological: Negative.   Psychiatric/Behavioral: Negative.     Objective:   Today's Vitals: BP 123/80 (BP Location: Left Arm, Patient Position: Sitting, Cuff Size: Normal)    Pulse 84    Temp 98.2 F (36.8 C) (Oral)    Resp 18    Ht 5\' 9"  (1.753 m)    SpO2 100%    BMI 26.58 kg/m   Physical Exam Vitals and nursing note reviewed.  Constitutional:      Appearance: Normal appearance.  HENT:     Head: Normocephalic and  atraumatic.     Right Ear: External ear normal.     Left Ear: External ear normal.     Nose: Nose normal.     Mouth/Throat:     Mouth: Mucous membranes are moist.     Pharynx: Oropharynx is clear.  Eyes:     Extraocular Movements: Extraocular movements intact.     Conjunctiva/sclera: Conjunctivae normal.     Pupils: Pupils are equal, round, and reactive to light.  Cardiovascular:     Rate and Rhythm: Normal rate and regular rhythm.     Pulses: Normal pulses.     Heart sounds: Normal heart sounds.  Pulmonary:     Effort: Pulmonary effort is normal.     Breath sounds: Normal breath sounds.  Musculoskeletal:        General: Normal range of motion.     Cervical back: Normal range of motion and neck supple.  Skin:    General: Skin is warm and dry.  Neurological:      General: No focal deficit present.     Mental Status: He is alert and oriented to person, place, and time.  Psychiatric:        Mood and Affect: Mood normal.        Thought Content: Thought content normal.        Judgment: Judgment normal.    Assessment & Plan:   Problem List Items Addressed This Visit   None Visit Diagnoses     Bipolar affective disorder in remission ([place])    -  Primary   Relevant Medications   ARIPiprazole (ABILIFY) 5 MG tablet   gabapentin (NEURONTIN) 300 MG capsule   mirtazapine (REMERON) 15 MG tablet   Other Relevant Orders   CBC with Differential/Platelet (Completed)   Comp. Metabolic Panel (12) (Completed)   TSH (Completed)   Vitamin D, 25-hydroxy (Completed)   Opioid abuse with opioid-induced mood disorder (HCC)       Chronic hepatitis C without hepatic coma (HCC)       Relevant Orders   Ambulatory referral to Infectious Disease   Alcohol abuse       Osteoarthritis of multiple joints, unspecified osteoarthritis type       Screening, lipid       Relevant Orders   Lipid panel (Completed)   Screening for HIV (human immunodeficiency virus)       Relevant Orders   HIV antibody (with reflex) (Completed)       Outpatient Encounter Medications as of 06/27/2021  Medication Sig   doxycycline (VIBRAMYCIN) 100 MG capsule Take 1 capsule (100 mg total) by mouth 2 (two) times daily.   mupirocin cream (BACTROBAN) 2 % Apply 1 application topically 2 (two) times daily.   ARIPiprazole (ABILIFY) 5 MG tablet Take 1 tablet (5 mg total) by mouth daily.   gabapentin (NEURONTIN) 300 MG capsule Take 1 capsule (300 mg total) by mouth at bedtime.   mirtazapine (REMERON) 15 MG tablet Take 1 tablet (15 mg total) by mouth at bedtime.   [DISCONTINUED] ARIPiprazole (ABILIFY) 5 MG tablet TAKE 1 TABLET BY MOUTH EVERY DAY   [DISCONTINUED] gabapentin (NEURONTIN) 300 MG capsule Take 1 capsule (300 mg total) by mouth 3 (three) times daily. (Patient not taking: Reported on  06/27/2021)   [DISCONTINUED] mirtazapine (REMERON) 15 MG tablet TAKE 1 TABLET BY MOUTH EVERYDAY AT BEDTIME (Patient not taking: Reported on 06/27/2021)   [DISCONTINUED] mupirocin ointment (BACTROBAN) 2 % Apply 15 g topically 2 (two) times daily.   No facility-administered  encounter medications on file as of 06/27/2021.   1. Bipolar affective disorder in remission (HCC) Restart Abilify, gabapentin, Remeron.  Patient to follow-up with mobile unit in 2 weeks.  Red flags given for prompt reevaluation - ARIPiprazole (ABILIFY) 5 MG tablet; Take 1 tablet (5 mg total) by mouth daily.  Dispense: 30 tablet; Refill: 1 - gabapentin (NEURONTIN) 300 MG capsule; Take 1 capsule (300 mg total) by mouth at bedtime.  Dispense: 30 capsule; Refill: 1 - mirtazapine (REMERON) 15 MG tablet; Take 1 tablet (15 mg total) by mouth at bedtime.  Dispense: 30 tablet; Refill: 1 - CBC with Differential/Platelet - Comp. Metabolic Panel (12) - TSH - Vitamin D, 25-hydroxy  2. Chronic hepatitis C without hepatic coma ([place])  - Ambulatory referral to Infectious Disease  3. Opioid abuse with opioid-induced mood disorder ([place]) Currently in substance abuse treatment program  4. Alcohol abuse   5. Osteoarthritis of multiple joints, unspecified osteoarthritis type Continue lifestyle modifications  6. Screening, lipid  - Lipid panel  7. Screening for HIV (human immunodeficiency virus)  - HIV antibody (with reflex)   I have reviewed the patient's medical history (PMH, PSH, Social History, Family History, Medications, and allergies) , and have been updated if relevant. I spent 30 minutes reviewing chart and  face to face time with patient.    Follow-up: Return in about 2 weeks (around 07/11/2021).   Loraine Grip Mayers, PA-C

## 2021-06-27 NOTE — Progress Notes (Signed)
Patient has low appetite at this time. Patient has not taken medication today. Patient denies pain at htos time.

## 2021-06-28 DIAGNOSIS — M159 Polyosteoarthritis, unspecified: Secondary | ICD-10-CM | POA: Insufficient documentation

## 2021-06-28 DIAGNOSIS — B182 Chronic viral hepatitis C: Secondary | ICD-10-CM | POA: Insufficient documentation

## 2021-06-28 DIAGNOSIS — F1114 Opioid abuse with opioid-induced mood disorder: Secondary | ICD-10-CM | POA: Insufficient documentation

## 2021-06-28 DIAGNOSIS — F1010 Alcohol abuse, uncomplicated: Secondary | ICD-10-CM | POA: Insufficient documentation

## 2021-06-28 DIAGNOSIS — F3170 Bipolar disorder, currently in remission, most recent episode unspecified: Secondary | ICD-10-CM | POA: Insufficient documentation

## 2021-06-28 LAB — COMP. METABOLIC PANEL (12)
AST: 41 IU/L — ABNORMAL HIGH (ref 0–40)
Albumin/Globulin Ratio: 1.7 (ref 1.2–2.2)
Albumin: 4.7 g/dL (ref 4.0–5.0)
Alkaline Phosphatase: 89 IU/L (ref 44–121)
BUN/Creatinine Ratio: 17 (ref 9–20)
BUN: 15 mg/dL (ref 6–20)
Bilirubin Total: 0.6 mg/dL (ref 0.0–1.2)
Calcium: 9.6 mg/dL (ref 8.7–10.2)
Chloride: 103 mmol/L (ref 96–106)
Creatinine, Ser: 0.88 mg/dL (ref 0.76–1.27)
Globulin, Total: 2.8 g/dL (ref 1.5–4.5)
Glucose: 93 mg/dL (ref 70–99)
Potassium: 4.3 mmol/L (ref 3.5–5.2)
Sodium: 138 mmol/L (ref 134–144)
Total Protein: 7.5 g/dL (ref 6.0–8.5)
eGFR: 118 mL/min/{1.73_m2} (ref >59)

## 2021-06-28 LAB — LIPID PANEL
Chol/HDL Ratio: 4.3 ratio (ref 0.0–5.0)
Cholesterol, Total: 187 mg/dL (ref 100–199)
HDL: 44 mg/dL (ref >39)
LDL Chol Calc (NIH): 106 mg/dL — ABNORMAL HIGH (ref 0–99)
Triglycerides: 213 mg/dL — ABNORMAL HIGH (ref 0–149)
VLDL Cholesterol Cal: 37 mg/dL (ref 5–40)

## 2021-06-28 LAB — CBC WITH DIFFERENTIAL/PLATELET
Basophils Absolute: 0.1 10*3/uL (ref 0.0–0.2)
Basos: 1 %
EOS (ABSOLUTE): 0.3 10*3/uL (ref 0.0–0.4)
Eos: 4 %
Hematocrit: 42.3 % (ref 37.5–51.0)
Hemoglobin: 14.7 g/dL (ref 13.0–17.7)
Immature Grans (Abs): 0.1 10*3/uL (ref 0.0–0.1)
Immature Granulocytes: 1 %
Lymphocytes Absolute: 2.5 10*3/uL (ref 0.7–3.1)
Lymphs: 35 %
MCH: 30.9 pg (ref 26.6–33.0)
MCHC: 34.8 g/dL (ref 31.5–35.7)
MCV: 89 fL (ref 79–97)
Monocytes Absolute: 0.6 10*3/uL (ref 0.1–0.9)
Monocytes: 8 %
Neutrophils Absolute: 3.7 10*3/uL (ref 1.4–7.0)
Neutrophils: 51 %
Platelets: 234 10*3/uL (ref 150–450)
RBC: 4.76 x10E6/uL (ref 4.14–5.80)
RDW: 14 % (ref 11.6–15.4)
WBC: 7.2 10*3/uL (ref 3.4–10.8)

## 2021-06-28 LAB — TSH: TSH: 1.92 u[IU]/mL (ref 0.450–4.500)

## 2021-06-28 LAB — HIV ANTIBODY (ROUTINE TESTING W REFLEX): HIV Screen 4th Generation wRfx: NONREACTIVE

## 2021-06-28 LAB — VITAMIN D 25 HYDROXY (VIT D DEFICIENCY, FRACTURES): Vit D, 25-Hydroxy: 41 ng/mL (ref 30.0–100.0)

## 2021-06-29 ENCOUNTER — Other Ambulatory Visit (HOSPITAL_COMMUNITY): Payer: Self-pay

## 2021-06-29 ENCOUNTER — Telehealth: Payer: Self-pay

## 2021-06-29 NOTE — Telephone Encounter (Signed)
[facility] Patient Teacher, English as a foreign language completed.    The patient is insured through [facility] [place] and has a $4.00 copay.  Medication will need a PA.  We will continue to follow to see if copay assistance is needed.  David Andrews, [facility] Patient [facility] for Infectious Disease Phone: [phone] Fax:  [phone]

## 2021-06-29 NOTE — Progress Notes (Signed)
MA attempted the RN at [facility] twice today with no success to share results, will call Tuesday upon [facility] return.

## 2021-07-04 ENCOUNTER — Encounter: Payer: Medicaid Other | Admitting: Infectious Diseases

## 2021-07-04 NOTE — Progress Notes (Deleted)
[facility] Infectious Diseases                                      [address], [place], [place], [address]                                               Phn. [phone]; Fax: [phone]                                                               Date:  Reason for Visit: Hepatitis C    HPI: David Andrews is a 31 y.o.old male with   Denies h/o injectable or intranasal cocaine use, blood transfusion, sharing of toothbrushes/razors, or sexual contact with known positive partners, incarceration or Armed forces logistics/support/administrative officer.  No personal or family history of liver disease, Hepatitis or Liver cancer.  He has not received treatment to date   Denies any hospitalizations related to liver disease, jaundice, ascites, GI bleeding, mental status changes, abdominal pain and acholic stool.   ROS: Denies yellowish discoloration of sclera and skin, abdominal pain/distension, hematemesis.            Dcough, fever, chills, nightsweats, nausea, vomiting, diarrhea, constipation, weight loss, recent hospitalizations, rashes, joint complaints, shortness of breath, chest pain, headaches, dysuria .   @[facility] @  Allergies  Allergen Reactions   Mustard Seed Hives   Prozac [Fluoxetine Hcl] Other (See Comments)    Per pt adverse mental reaction   Wellbutrin [Bupropion] Swelling    "makes him crazy"    PMH  PSH  Social  Family  Physical exam: There were no vitals taken for this visit.  Gen: Alert and oriented x 3, no acute distress HEENT: [place]/AT, PERL, EOMI, no scleral icterus, no pale conjunctivae, hearing normal, oral mucosa moist Neck: Supple, no lymphadenopathy Cardio: Regular rate and rhythm; +S1 and S2; no murmurs, gallops, or rubs Resp: CTAB; no wheezes, rhonchi, or rales GI: Soft, nontender, nondistended, bowel sounds present GU: Musc: Extremities: No cyanosis, clubbing, or edema; +2 PT and DP pulses Skin: No rashes, lesions, or  ecchymoses Neuro: No focal deficits Psych: Calm, cooperative   Laboratory  CBC w diff CMP PT/PTT/INR Hep A and B serologies  HIV HCV genotype HCV RNA Measure of fibrosis NS5A resistance testing in select scenarios    Assessment/Plan:  Hepatitis C Prior treatment:  GT: Evidence of cirrhosis:  Interested in treatment: Potential DDI   Smoking/Alcohol/Illicit substance use    Counseling done on the following -Natural progression of hep c, transmission (avoid sharing personal hygiene equipment), prevention, risks of left untreated and treatment options  -Avoid hepatotoxins like alcohol and excessive acetamaminphen (no more than 2 gram a day) -Avoid eating raw sea food -Risks of re-infection  -Hepatitis coinfection and vaccination( Pneumococcal vaccination in the cirrhotics    - [place] screening with [place] every 6 months - EGD to r/o varices in cirrhotics   I have personally spent more than 70 minutes involved in face-to-face and non-face-to-face activities for this patient on the day of the visit. Professional time spent includes  the following activities: Preparing to see the patient (review of tests), Obtaining and/or reviewing separately obtained history (admission/discharge record), Performing a medically appropriate examination and/or evaluation , Ordering medications/tests/procedures, referring and communicating with other health care professionals, Documenting clinical information in the EMR, Independently interpreting results (not separately reported), Communicating results to the patient/family/caregiver, Counseling and educating the patient/family/caregiver and Care coordination (not separately reported).   Patients questions were addressed and answered.   Electronically signed by:  Rosiland Oz, MD Infectious Diseases  Office phone [phone] Fax no. [phone]

## 2021-07-10 ENCOUNTER — Ambulatory Visit: Payer: Medicaid Other | Admitting: Physician Assistant

## 2021-07-10 ENCOUNTER — Other Ambulatory Visit: Payer: Self-pay

## 2021-07-10 VITALS — BP 127/81 | HR 84 | Temp 98.2°F | Resp 18 | Ht 70.0 in | Wt 181.0 lb

## 2021-07-10 DIAGNOSIS — B182 Chronic viral hepatitis C: Secondary | ICD-10-CM | POA: Diagnosis not present

## 2021-07-10 DIAGNOSIS — F1010 Alcohol abuse, uncomplicated: Secondary | ICD-10-CM

## 2021-07-10 DIAGNOSIS — L309 Dermatitis, unspecified: Secondary | ICD-10-CM

## 2021-07-10 DIAGNOSIS — F3170 Bipolar disorder, currently in remission, most recent episode unspecified: Secondary | ICD-10-CM

## 2021-07-10 MED ORDER — GABAPENTIN 300 MG PO CAPS: 300.0000 mg | ORAL_CAPSULE | Freq: Two times a day (BID) | ORAL | 1 refills | Status: DC

## 2021-07-10 MED ORDER — GABAPENTIN 300 MG PO CAPS
300.0000 mg | ORAL_CAPSULE | Freq: Two times a day (BID) | ORAL | 1 refills | Status: DC
  Filled 2021-07-10: qty 60, 30d supply, fill #0

## 2021-07-10 MED ORDER — TRIAMCINOLONE ACETONIDE 0.1 % EX CREA: 1.0000 "application " | TOPICAL_CREAM | Freq: Two times a day (BID) | CUTANEOUS | 0 refills | Status: DC

## 2021-07-10 MED ORDER — MUPIROCIN CALCIUM 2 % EX CREA: 1.0000 "application " | TOPICAL_CREAM | Freq: Two times a day (BID) | CUTANEOUS | 0 refills | Status: DC

## 2021-07-10 MED ORDER — TRIAMCINOLONE ACETONIDE 0.1 % EX CREA
1.0000 "application " | TOPICAL_CREAM | Freq: Two times a day (BID) | CUTANEOUS | 0 refills | Status: DC
  Filled 2021-07-10: qty 30, 15d supply, fill #0

## 2021-07-10 MED ORDER — MUPIROCIN CALCIUM 2 % EX CREA
1.0000 "application " | TOPICAL_CREAM | Freq: Two times a day (BID) | CUTANEOUS | 0 refills | Status: DC
  Filled 2021-07-10: qty 15, 8d supply, fill #0

## 2021-07-10 NOTE — Patient Instructions (Signed)
You are going to start taking an increased dose of gabapentin, 300 mg twice daily.  To help with your rash, you will use Bactroban twice a day and Kenalog twice a day until resolved.  I do encourage you to stagger these.  I encourage you to do gentle cleansing of your face as well.  I encourage you to call and schedule appointment with infectious disease.  I encourage you to follow-up with a medical provider or return to the mobile medicine unit in 4 to 6 weeks.  Kennieth Rad, PA-C Physician Assistant [facility] Medicine [email or web address]   Acne Acne is a skin problem that causes pimples and other skin changes. The skin has many tiny openings called pores. Each pore contains an oil gland. Oil glands make an oily substance that is called sebum. Acne occurs when the pores in the skin get blocked. The pores may become infected with bacteria, or they may become red, sore, and swollen. Acne is a common skin problem, especially for teenagers. It often occurs on the face, neck, chest, upper arms, and back. Acne usually goes away over time. What are the causes? Acne is caused when oil glands get blocked with sebum, dead skin cells, and dirt. The bacteria that are normally found in the oil glands then multiply and cause inflammation. Acne is commonly triggered by changes in your hormones. These hormonal changes can cause the oil glands to get bigger and to make more sebum. Factors that can make acne worse include: Hormone changes during: Adolescence. Women's menstrual cycles. Pregnancy. Oil-based cosmetics and hair products. Stress. Hormone problems that are caused by certain diseases. Certain medicines. Pressure from headbands, backpacks, or shoulder pads. Exposure to certain oils and chemicals. Eating a diet high in carbohydrates that quickly turn to sugar. These include dairy products, desserts, and chocolates. What increases the  risk? This condition is more likely to develop in: Teenagers. People who have a family history of acne. What are the signs or symptoms? Symptoms include: Small, red bumps (pimples or papules). Whiteheads. Blackheads. Small, pus-filled pimples (pustules). Big, red pimples or pustules that feel tender. More severe acne can cause: An abscess. This is an infected area that contains a collection of pus. Cysts. These are hard, painful, fluid-filled sacs. Scars. These can happen after large pimples heal. How is this diagnosed? This condition is diagnosed with a medical history and physical exam. Blood tests may also be done. How is this treated? Treatment for this condition can vary depending on the severity of your acne. Treatment may include: Creams and lotions that prevent oil glands from clogging. Creams and lotions that treat or prevent infections and inflammation. Antibiotic medicines that are applied to the skin or taken as a pill. Pills that decrease sebum production. Birth control pills. Light or laser treatments. Injections of medicine into the affected areas. Chemicals that cause peeling of the skin. Surgery. Your health care provider will also recommend the best way to take care of your skin. Good skin care is the most important part of treatment. Follow these instructions at home: Skin care Take care of your skin as told by your health care provider. You may be told to do these things: Wash your skin gently at least two times each day, as well as: After you exercise. Before you go to bed. Use mild soap. Apply a water-based skin moisturizer after you wash your skin. Use a sunscreen or sunblock with SPF 30 or greater. This is especially important if you  are using acne medicines. Choose cosmetics that will not block your oil glands (are noncomedogenic). Medicines Take over-the-counter and prescription medicines only as told by your health care provider. If you were  prescribed an antibiotic medicine, apply it or take it as told by your health care provider. Do not stop using the antibiotic even if your condition improves. General instructions Keep your hair clean and off your face. If you have oily hair, shampoo your hair regularly or daily. Avoid wearing tight headbands or hats. Avoid picking or squeezing your pimples. That can make your acne worse and cause scarring. Shave gently and only when necessary. Keep a food journal to figure out if any foods are linked to your acne. Avoid dairy products, desserts, and chocolates. Take steps to manage and reduce stress. Keep all follow-up visits as told by your health care provider. This is important. Contact a health care provider if: Your acne is not better after eight weeks. Your acne gets worse. You have a large area of skin that is red or tender. You think that you are having side effects from any acne medicine. Summary Acne is a skin problem that causes pimples and other skin changes. Acne is a common skin problem, especially for teenagers. Acne usually goes away over time. Acne is commonly triggered by changes in your hormones. There are many other causes, such as stress, diet, and certain medicines. Follow your health care provider's instructions for how to take care of your skin. Good skin care is the most important part of treatment. Take over-the-counter and prescription medicines only as told by your health care provider. Contact your health care provider if you think that you are having side effects from any acne medicine. This information is not intended to replace advice given to you by your health care provider. Make sure you discuss any questions you have with your health care provider. Document Revised: 10/29/2017 Document Reviewed: 10/29/2017 Elsevier Patient Education  [place].

## 2021-07-10 NOTE — Progress Notes (Signed)
Patient made aware of results while in person today

## 2021-07-10 NOTE — Progress Notes (Signed)
Patient reports rash on his face since wearing the N95 las week due to flu Sx. Patient has eaten and taken medication today

## 2021-07-10 NOTE — Progress Notes (Signed)
Established Patient Office Visit  Subjective:  Patient ID: David Andrews, male    DOB: June 05, 1991  Age: 31 y.o. MRN: [id number]  CC:  Chief Complaint  Patient presents with   Depression    HPI David Andrews states that he continues to be treated for substance abuse at [facility] residential treatment center.  Reports that he will be transitioning to caring services in [facility] as soon as he is able to show that he is mentally stable.  Reports that his overall mood is improving but does endorse that his anxiety levels are still elevated.  Reports the gabapentin at bedtime is offering some relief from his anxiety, but he does notice that it does increase again in the morning.  Reports that sleep is good.  Reports that he has been having a breakout on his chin and general mask area.  Reports that he has a history of very sensitive skin, has been having to wear the same mask for several days after having a viral illness.  Reports that he is unable to use soap on his face, states that "it makes it worse".  States he has only been using warm water.   Past Medical History:  Diagnosis Date   DDD (degenerative disc disease), lumbar    Gastric ulcer    Herniated disc    Larynx cancer (HCC)     Past Surgical History:  Procedure Laterality Date   WISDOM TOOTH EXTRACTION      Family History  Problem Relation Age of Onset   Drug abuse Father     Social History   Socioeconomic History   Marital status: Single    Spouse name: Not on file   Number of children: Not on file   Years of education: Not on file   Highest education level: Not on file  Occupational History   Not on file  Tobacco Use   Smoking status: Every Day    Packs/day: 2.00    Types: Cigarettes   Smokeless tobacco: Never  Vaping Use   Vaping Use: Never used  Substance and Sexual Activity   Alcohol use: No   Drug use: Yes    Types: Marijuana, "Crack" cocaine    Comment: Smokes cannabis occasionally; started  snorting coke at age 18 when his father introduced him to it. 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; abstinent since then   Sexual activity: Yes  Other Topics Concern   Not on file  Social History Narrative   Not on file   Social Determinants of Health   Financial Resource Strain: Not on file  Food Insecurity: Not on file  Transportation Needs: Not on file  Physical Activity: Not on file  Stress: Not on file  Social Connections: Not on file  Intimate Partner Violence: Not on file    Outpatient Medications Prior to Visit  Medication Sig Dispense Refill   ARIPiprazole (ABILIFY) 5 MG tablet Take 1 tablet (5 mg total) by mouth daily. 30 tablet 1   doxycycline (VIBRAMYCIN) 100 MG capsule Take 1 capsule (100 mg total) by mouth 2 (two) times daily. 20 capsule 0   mirtazapine (REMERON) 15 MG tablet Take 1 tablet (15 mg total) by mouth at bedtime. 30 tablet 1   gabapentin (NEURONTIN) 300 MG capsule Take 1 capsule (300 mg total) by mouth at bedtime. 30 capsule 1   mupirocin cream (BACTROBAN) 2 % Apply 1 application topically 2 (  two) times daily. 15 g 0   No facility-administered medications prior to visit.    Allergies  Allergen Reactions   Mustard Seed Hives   Prozac [Fluoxetine Hcl] Other (See Comments)    Per pt adverse mental reaction   Wellbutrin [Bupropion] Swelling    "makes him crazy"    ROS Review of Systems  Constitutional:  Negative for chills and fever.  HENT: Negative.    Eyes: Negative.   Respiratory:  Negative for shortness of breath.   Cardiovascular:  Negative for chest pain.  Gastrointestinal: Negative.   Endocrine: Negative.   Genitourinary: Negative.   Musculoskeletal: Negative.   Skin:  Positive for color change and rash.  Allergic/Immunologic: Negative.   Neurological: Negative.   Hematological: Negative.   Psychiatric/Behavioral:  Positive for dysphoric mood. Negative for self-injury,  sleep disturbance and suicidal ideas. The patient is nervous/anxious.      Objective:    Physical Exam Vitals and nursing note reviewed.  Constitutional:      Appearance: Normal appearance.  HENT:     Head: Normocephalic and atraumatic.     Right Ear: External ear normal.     Left Ear: External ear normal.     Nose: Nose normal.     Mouth/Throat:     Mouth: Mucous membranes are moist.     Pharynx: Oropharynx is clear.  Eyes:     Extraocular Movements: Extraocular movements intact.     Conjunctiva/sclera: Conjunctivae normal.     Pupils: Pupils are equal, round, and reactive to light.  Cardiovascular:     Rate and Rhythm: Normal rate and regular rhythm.     Pulses: Normal pulses.     Heart sounds: Normal heart sounds.  Pulmonary:     Effort: Pulmonary effort is normal.     Breath sounds: Normal breath sounds.  Musculoskeletal:        General: Normal range of motion.     Cervical back: Normal range of motion and neck supple.  Skin:    General: Skin is warm.     Comments: See photos  Neurological:     General: No focal deficit present.     Mental Status: He is alert and oriented to person, place, and time.  Psychiatric:        Mood and Affect: Mood normal.        Behavior: Behavior normal.        Thought Content: Thought content normal.        Judgment: Judgment normal.     Verbal consent given by patient to have photo included in chart   BP 127/81 (BP Location: Left Arm, Patient Position: Sitting, Cuff Size: Normal)    Pulse 84    Temp 98.2 F (36.8 C) (Oral)    Resp 18    Ht $R'5\' 10"'Fw$  (1.778 m)    Wt 181 lb (82.1 kg)    SpO2 100%    BMI 25.97 kg/m  Wt Readings from Last 3 Encounters:  07/10/21 181 lb (82.1 kg)  06/17/21 180 lb (81.6 kg)  02/01/20 150 lb (68 kg)     Health Maintenance Due  Topic Date Due   COVID-19 Vaccine (1) Never done   Pneumococcal Vaccine 52-86 Years old (1 - PCV) Never done   TETANUS/TDAP  Never done   INFLUENZA VACCINE  Never done     There are no preventive care reminders to display for this patient.  Lab Results  Component Value Date   TSH 1.920 06/27/2021  Lab Results  Component Value Date   WBC 7.2 06/27/2021   HGB 14.7 06/27/2021   HCT 42.3 06/27/2021   MCV 89 06/27/2021   PLT 234 06/27/2021   Lab Results  Component Value Date   NA 138 06/27/2021   K 4.3 06/27/2021   CO2 28 03/24/2016   GLUCOSE 93 06/27/2021   BUN 15 06/27/2021   CREATININE 0.88 06/27/2021   BILITOT 0.6 06/27/2021   ALKPHOS 89 06/27/2021   AST 41 (H) 06/27/2021   ALT 16 (L) 03/24/2016   PROT 7.5 06/27/2021   ALBUMIN 4.7 06/27/2021   CALCIUM 9.6 06/27/2021   ANIONGAP 9 03/24/2016   EGFR 118 06/27/2021   Lab Results  Component Value Date   CHOL 187 06/27/2021   Lab Results  Component Value Date   HDL 44 06/27/2021   Lab Results  Component Value Date   LDLCALC 106 (H) 06/27/2021   Lab Results  Component Value Date   TRIG 213 (H) 06/27/2021   Lab Results  Component Value Date   CHOLHDL 4.3 06/27/2021   No results found for: HGBA1C    Assessment & Plan:   Problem List Items Addressed This Visit       Digestive   Chronic hepatitis C without hepatic coma (HCC)   Relevant Medications   mupirocin cream (BACTROBAN) 2 %     Other   Bipolar affective disorder in remission ([place]) - Primary   Relevant Medications   gabapentin (NEURONTIN) 300 MG capsule   Alcohol abuse   Other Visit Diagnoses     Dermatitis       Relevant Medications   mupirocin cream (BACTROBAN) 2 %   triamcinolone cream (KENALOG) 0.1 %       Meds ordered this encounter  Medications   DISCONTD: mupirocin cream (BACTROBAN) 2 %    Sig: Apply 1 application topically 2 (two) times daily.    Dispense:  15 g    Refill:  0    Order Specific Question:   Supervising Provider    Answer:   Elsie Stain [[id number]]   DISCONTD: gabapentin (NEURONTIN) 300 MG capsule    Sig: Take 1 capsule (300 mg total) by mouth 2 (two) times daily.     Dispense:  60 capsule    Refill:  1    Dose change    Order Specific Question:   Supervising Provider    Answer:   Elsie Stain [[id number]]   DISCONTD: triamcinolone cream (KENALOG) 0.1 %    Sig: Apply 1 application topically 2 (two) times daily.    Dispense:  30 g    Refill:  0    Order Specific Question:   Supervising Provider    Answer:   Asencion Noble E [[id number]]   gabapentin (NEURONTIN) 300 MG capsule    Sig: Take 1 capsule (300 mg total) by mouth 2 (two) times daily.    Dispense:  60 capsule    Refill:  1    Dose change    Order Specific Question:   Supervising Provider    Answer:   Elsie Stain [[id number]]   mupirocin cream (BACTROBAN) 2 %    Sig: Apply 1 application topically 2 (two) times daily.    Dispense:  15 g    Refill:  0    Order Specific Question:   Supervising Provider    Answer:   Elsie Stain [[id number]]   triamcinolone cream (KENALOG) 0.1 %    Sig: Apply 1  application topically 2 (two) times daily.    Dispense:  30 g    Refill:  0    Order Specific Question:   Supervising Provider    Answer:   WRIGHT, PATRICK E [[id number]]  1. Bipolar affective disorder in remission (HCC) Increase gabapentin, twice daily.  Patient encouraged to follow-up with mobile unit in 4 to 6 weeks if unable to have medical provider at caring services in [facility]  Red flags given for prompt reevaluation. - gabapentin (NEURONTIN) 300 MG capsule; Take 1 capsule (300 mg total) by mouth 2 (two) times daily.  Dispense: 60 capsule; Refill: 1  2. Dermatitis Patient education given on supportive care, trial Bactroban, Kenalog - mupirocin cream (BACTROBAN) 2 %; Apply 1 application topically 2 (two) times daily.  Dispense: 15 g; Refill: 0 - triamcinolone cream (KENALOG) 0.1 %; Apply 1 application topically 2 (two) times daily.  Dispense: 30 g; Refill: 0  3. Chronic hepatitis C without hepatic coma ([place]) Unfortunately patient did miss appointment with infectious disease due to viral illness.   Patient encouraged to follow-up with infectious disease to reschedule appointment.  Patient understands and agrees  4. Alcohol abuse Currently in substance abuse treatment program   I have reviewed the patient's medical history (PMH, PSH, Social History, Family History, Medications, and allergies) , and have been updated if relevant. I spent 30 minutes reviewing chart and  face to face time with patient.     Follow-up: Return if symptoms worsen or fail to improve.    Loraine Grip Mayers, PA-C

## 2021-07-18 ENCOUNTER — Encounter: Payer: Self-pay | Admitting: Infectious Diseases

## 2021-07-18 ENCOUNTER — Ambulatory Visit (INDEPENDENT_AMBULATORY_CARE_PROVIDER_SITE_OTHER): Payer: Medicaid Other | Admitting: Infectious Diseases

## 2021-07-18 ENCOUNTER — Other Ambulatory Visit: Payer: Self-pay

## 2021-07-18 VITALS — BP 133/80 | HR 80 | Temp 97.4°F | Wt 192.0 lb

## 2021-07-18 DIAGNOSIS — F17200 Nicotine dependence, unspecified, uncomplicated: Secondary | ICD-10-CM

## 2021-07-18 DIAGNOSIS — B182 Chronic viral hepatitis C: Secondary | ICD-10-CM | POA: Diagnosis present

## 2021-07-18 DIAGNOSIS — Z1159 Encounter for screening for other viral diseases: Secondary | ICD-10-CM | POA: Diagnosis not present

## 2021-07-18 DIAGNOSIS — Z23 Encounter for immunization: Secondary | ICD-10-CM | POA: Diagnosis not present

## 2021-07-18 NOTE — Progress Notes (Signed)
[facility]                                      [address], [place], [place], [address]                                               Phn. [phone]; Fax: [phone]                                                               Date: 07/18/21 Reason for Visit: Hepatitis C    HPI: David Andrews is a 31 y.o.old male with past medical history of anxiety, depression, Bipolar d/o, hepatitis C, degenerative disc disease and laryngeal neoplasm status post excision per patient who is referred for evaluation and management of hepatitis C.  Patient is currently staying in the [facility] facility for drug rehabilitation where he has been staying for last 117 days.  He said he went to jail for 2 months prior to that.  He knew he was hep C positive for last 3 or more years but never received treatment as he was using IV drugs.  He started using IV drugs since the age of 73 and has used IV cocaine, meth and IV heroin.  He believes that he might have been sexually active with partners who are positive for hepatitis C  Smokes 1 pack of cigarettes a day but has not been able to smoke since he has been in the rehab, denies alcohol use.  He is not ready to quit smoking yet.  he follows up with a provider for anxiety, depression at [facility].  He takes aripiprazole, mirtazapine and gabapentin.   Denies h/o blood transfusion, sharing of toothbrushes/razors.  He also has tattoos in his body which he did himself taking aseptic precautions. denies personal or family history of liver disease or Liver cancer.  He has not received treatment to date but is willing to be treated.   Recently seen in the ED 17/51 for folliculitis in his face and was prescribed doxycycline  Denies any hospitalizations related to liver disease, jaundice, ascites, GI bleeding, mental status changes, abdominal pain and acholic stool.   ROS: Denies yellowish  discoloration of sclera and skin, abdominal pain/distension, hematemesis.            Denies fever, chills, nightsweats, nausea, vomiting, diarrhea, constipation, weight loss, recent hospitalizations, rashes, joint complaints, shortness of breath, chest pain, headaches, dysuria .  Current Outpatient Medications on File Prior to Visit  Medication Sig Dispense Refill   ARIPiprazole (ABILIFY) 5 MG tablet Take 1 tablet (5 mg total) by mouth daily. 30 tablet 1   gabapentin (NEURONTIN) 300 MG capsule Take 1 capsule (300 mg total) by mouth 2 (two) times daily. 60 capsule 1   mirtazapine (REMERON) 15 MG tablet Take 1 tablet (15 mg total) by mouth at bedtime. 30 tablet 1   mupirocin cream (BACTROBAN) 2 % Apply 1 application topically 2 (two) times daily. 15 g 0   triamcinolone cream (KENALOG) 0.1 % Apply 1 application topically  2 (two) times daily. 30 g 0   No current facility-administered medications on file prior to visit.    Allergies  Allergen Reactions   Mustard Seed Hives   Prozac [Fluoxetine Hcl] Other (See Comments)    Per pt adverse mental reaction   Wellbutrin [Bupropion] Swelling    "makes him crazy"   Past Medical History:  Diagnosis Date   DDD (degenerative disc disease), lumbar    Gastric ulcer    Herniated disc    Larynx cancer (HCC)    Past Surgical History:  Procedure Laterality Date   WISDOM TOOTH EXTRACTION      Social History   Socioeconomic History   Marital status: Single    Spouse name: Not on file   Number of children: Not on file   Years of education: Not on file   Highest education level: Not on file  Occupational History   Not on file  Tobacco Use   Smoking status: Former    Packs/day: 2.00    Types: Cigarettes   Smokeless tobacco: Never   Tobacco comments:    Pt states on a patch for smoking  Vaping Use   Vaping Use: Never used  Substance and Sexual Activity   Alcohol use: No   Drug use: Yes    Types: Marijuana, "Crack" cocaine    Comment:  Smokes cannabis occasionally; started snorting coke at age 24 when his father introduced him to it. 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; abstinent since then   Sexual activity: Yes  Other Topics Concern   Not on file  Social History Narrative   Not on file   Social Determinants of Health   Financial Resource Strain: Not on file  Food Insecurity: Not on file  Transportation Needs: Not on file  Physical Activity: Not on file  Stress: Not on file  Social Connections: Not on file  Intimate Partner Violence: Not on file   Family History  Problem Relation Age of Onset   Drug abuse Father     Physical exam: BP 133/80    Pulse 80    Temp (!) 97.4 F (36.3 C) (Oral)    Wt 192 lb (87.1 kg)    SpO2 100%    BMI 27.55 kg/m '  Gen: Alert and oriented x 3, no acute distress HEENT: [place]/AT, no scleral icterus, no pale conjunctivae, hearing normal, oral mucosa moist Neck: Supple, Cardio: Regular rate and rhythm; +S1 and S2 Resp: CTAB GI: Soft, nontender, nondistended, bowel sounds present GU: Musc: Extremities: No cyanosis, clubbing, or edema Skin: tattoos+, acne in face Neuro: No focal deficits Psych: Calm, cooperative   Laboratory  CBC Latest Ref Rng & Units 06/27/2021 03/24/2016 06/29/2015  WBC 3.4 - 10.8 x10E3/uL 7.2 9.4 12.5(H)  Hemoglobin 13.0 - 17.7 g/dL 14.7 14.0 13.7  Hematocrit 37.5 - 51.0 % 42.3 39.0 39.1  Platelets 150 - 450 x10E3/uL 234 249 271   CMP Latest Ref Rng & Units 06/27/2021 03/24/2016 06/29/2015  Glucose 70 - 99 mg/dL 93 90 116(H)  BUN 6 - 20 mg/dL 15 16 9   Creatinine 0.76 - 1.27 mg/dL 0.88 0.99 0.79  Sodium 134 - 144 mmol/L 138 142 139  Potassium 3.5 - 5.2 mmol/L 4.3 3.3(L) 3.8  Chloride 96 - 106 mmol/L 103 105 101  CO2 22 - 32 mmol/L - 28 26  Calcium 8.7 - 10.2 mg/dL 9.6 9.7 10.1  Total Protein 6.0 -  8.5 g/dL 7.5 7.8 8.2(H)  Total Bilirubin 0.0 - 1.2 mg/dL 0.6 0.8 0.8  Alkaline Phos 44 -  121 IU/L 89 77 75  AST 0 - 40 IU/L 41(H) 22 28  ALT 17 - 63 U/L - 16(L) 23   Problem List Items Addressed This Visit       Digestive   Chronic hepatitis C without hepatic coma (HCC) - Primary   Relevant Orders   Hepatitis C RNA quantitative   Hepatitis A antibody, total   Hepatitis B core antibody, total   Hepatitis B surface antibody,qualitative   Hepatitis B surface antigen   Hepatitis C genotype   Protime-INR   Liver Fibrosis, FibroTest-ActiTest   US ABDOMEN COMPLETE W/ELASTOGRAPHY     Other   Smoking   Need for hepatitis B screening test   Need for hepatitis A immunization    Assessment/Plan: Chronic Hepatitis C, treatment nave Labs today  Fu in 4 weeks for tx planning   Smoking Counseled  Illicit substance use  On drug rehab program  Anxiety/depression/bipolar d/o On medications Fu with [facility] [place]  Counseling done on the following -Natural progression of hep c, transmission (avoid sharing personal hygiene equipment), prevention, risks of left untreated and treatment options  -Avoid hepatotoxins like alcohol and excessive acetamaminphen (no more than 2 gram a day) -Avoid eating raw sea food -Risks of re-infection  -Hepatitis coinfection and vaccinatioN  I have personally spent 70 minutes involved in face-to-face and non-face-to-face activities for this patient on the day of the visit. Professional time spent includes the following activities: Preparing to see the patient (review of tests), Obtaining and/or reviewing separately obtained history (admission/discharge record), Performing a medically appropriate examination and/or evaluation , Ordering medications/tests/procedures, referring and communicating with other health care professionals, Documenting clinical information in the EMR, Independently interpreting results (not separately reported), Communicating results to the patient/family/caregiver, Counseling and educating the  patient/family/caregiver and Care coordination (not separately reported).   Patients questions were addressed and answered.   Electronically signed by:  Rosiland Oz, MD Infectious Diseases  Office phone [phone] Fax no. [phone]

## 2021-07-24 LAB — LIVER FIBROSIS, FIBROTEST-ACTITEST
ALT: 73 U/L — ABNORMAL HIGH (ref 9–46)
Alpha-2-Macroglobulin: 152 mg/dL (ref 106–279)
Apolipoprotein A1: 138 mg/dL (ref 94–176)
Bilirubin: 0.5 mg/dL (ref 0.2–1.2)
Fibrosis Score: 0.08
GGT: 29 U/L (ref 3–90)
Haptoglobin: 190 mg/dL (ref 43–212)
Necroinflammat ACT Score: 0.35
Reference ID: 4194836

## 2021-07-24 LAB — HEPATITIS B CORE ANTIBODY, TOTAL: Hep B Core Total Ab: REACTIVE — AB

## 2021-07-24 LAB — HEPATITIS A ANTIBODY, TOTAL: Hepatitis A AB,Total: NONREACTIVE

## 2021-07-24 LAB — HEPATITIS C RNA QUANTITATIVE
HCV Quantitative Log: 6 log IU/mL — ABNORMAL HIGH
HCV RNA, PCR, QN: 1010000 IU/mL — ABNORMAL HIGH

## 2021-07-24 LAB — HEPATITIS B SURFACE ANTIBODY,QUALITATIVE: Hep B S Ab: REACTIVE — AB

## 2021-07-24 LAB — HEPATITIS B SURFACE ANTIGEN: Hepatitis B Surface Ag: NONREACTIVE

## 2021-07-24 LAB — PROTIME-INR
INR: 1.1
Prothrombin Time: 10.9 s (ref 9.0–11.5)

## 2021-07-24 LAB — HEPATITIS C GENOTYPE

## 2021-07-25 ENCOUNTER — Telehealth: Payer: Self-pay

## 2021-07-25 NOTE — Telephone Encounter (Signed)
Called patient to get a follow up scheduled with Dr. West Bali after scheduled ultrasound, call couldn't be completed as dialed.

## 2021-08-01 ENCOUNTER — Ambulatory Visit (HOSPITAL_COMMUNITY)
Admission: RE | Admit: 2021-08-01 | Discharge: 2021-08-01 | Disposition: A | Discharge status: Home / Self Care | Payer: Medicaid Other | Source: Ambulatory Visit | Attending: Infectious Diseases | Admitting: Infectious Diseases

## 2021-08-01 ENCOUNTER — Other Ambulatory Visit: Payer: Self-pay

## 2021-08-01 DIAGNOSIS — B182 Chronic viral hepatitis C: Secondary | ICD-10-CM | POA: Diagnosis present

## 2021-08-03 ENCOUNTER — Other Ambulatory Visit (HOSPITAL_COMMUNITY): Payer: Self-pay

## 2021-08-03 ENCOUNTER — Telehealth: Payer: Self-pay

## 2021-08-03 NOTE — Telephone Encounter (Signed)
[facility] Patient Advocate Encounter   Received notification from [facility] Medicaid that prior authorization for [place] is required.   PA submitted on 08/03/21 Key [id number] W Status is pending    [facility] will continue to follow.   Ileene Patrick, [facility] Patient [facility] for Infectious Disease Phone: [phone] Fax:  [phone]

## 2021-08-04 ENCOUNTER — Other Ambulatory Visit: Payer: Self-pay | Admitting: Pharmacist

## 2021-08-04 ENCOUNTER — Telehealth: Payer: Self-pay

## 2021-08-04 ENCOUNTER — Other Ambulatory Visit: Payer: Self-pay | Admitting: Physician Assistant

## 2021-08-04 ENCOUNTER — Other Ambulatory Visit (HOSPITAL_COMMUNITY): Payer: Self-pay

## 2021-08-04 DIAGNOSIS — B182 Chronic viral hepatitis C: Secondary | ICD-10-CM

## 2021-08-04 DIAGNOSIS — F3170 Bipolar disorder, currently in remission, most recent episode unspecified: Secondary | ICD-10-CM

## 2021-08-04 MED ORDER — MAVYRET 100-40 MG PO TABS
3.0000 | ORAL_TABLET | Freq: Every day | ORAL | 1 refills | Status: DC
  Filled 2021-08-04: qty 84, fill #0
  Filled 2021-08-07: qty 84, 28d supply, fill #0
  Filled 2021-09-08: qty 84, 28d supply, fill #1

## 2021-08-04 NOTE — Telephone Encounter (Signed)
[facility] Patient Advocate Encounter  Prior Authorization for [place] has been approved.    PA# [id number] Effective dates: 08/03/21 through 09/28/21  Patients co-pay is $4.00.   Prescription can be sent to [facility]  [facility] will continue to follow.  Ileene Patrick, [facility] Patient [facility] for Infectious Disease Phone: [phone] Fax:  [phone]

## 2021-08-07 ENCOUNTER — Other Ambulatory Visit: Payer: Self-pay

## 2021-08-07 ENCOUNTER — Ambulatory Visit: Payer: Medicaid Other | Admitting: Physician Assistant

## 2021-08-07 ENCOUNTER — Other Ambulatory Visit (HOSPITAL_COMMUNITY): Payer: Self-pay

## 2021-08-07 VITALS — BP 139/86 | HR 88 | Temp 98.6°F | Resp 18 | Ht 69.0 in | Wt 201.0 lb

## 2021-08-07 DIAGNOSIS — L309 Dermatitis, unspecified: Secondary | ICD-10-CM | POA: Diagnosis not present

## 2021-08-07 DIAGNOSIS — G245 Blepharospasm: Secondary | ICD-10-CM

## 2021-08-07 DIAGNOSIS — F3170 Bipolar disorder, currently in remission, most recent episode unspecified: Secondary | ICD-10-CM | POA: Diagnosis not present

## 2021-08-07 DIAGNOSIS — B182 Chronic viral hepatitis C: Secondary | ICD-10-CM | POA: Diagnosis not present

## 2021-08-07 DIAGNOSIS — F1010 Alcohol abuse, uncomplicated: Secondary | ICD-10-CM

## 2021-08-07 DIAGNOSIS — F1110 Opioid abuse, uncomplicated: Secondary | ICD-10-CM

## 2021-08-07 MED ORDER — GABAPENTIN 300 MG PO CAPS: 300.0000 mg | ORAL_CAPSULE | Freq: Two times a day (BID) | ORAL | 1 refills | Status: DC

## 2021-08-07 MED ORDER — MIRTAZAPINE 15 MG PO TABS: 15.0000 mg | ORAL_TABLET | Freq: Every day | ORAL | 1 refills | Status: DC

## 2021-08-07 MED ORDER — ARIPIPRAZOLE 5 MG PO TABS: 5.0000 mg | ORAL_TABLET | Freq: Every day | ORAL | 1 refills | Status: DC

## 2021-08-07 NOTE — Progress Notes (Signed)
Patient has eaten and taken medication today Patient reports involuntary muscle spasms in the L lower part of his cheek and L eye.

## 2021-08-07 NOTE — Progress Notes (Signed)
Established Patient Office Visit  Subjective:  Patient ID: David Andrews, male    DOB: 01/27/1991  Age: 31 y.o. MRN: [id number]  CC:  Chief Complaint  Patient presents with   Medication Refill    HPI David Andrews reports that he continues to be treated for substance abuse at [facility] residential treatment center, states that he will be transitioning to care and services in [facility] in the next couple of days.  States that he did notice an improvement in his moods with the increase in gabapentin, states that his moods are stable.  Reports that his facial acne did improve with the use of the Bactroban, however it is still present, states that he continues to wash with mild soap and water.  Reports that he feels confident that it will resolve once he has transitioned and is able to not have to wear a mask most of the day. Reports that he was able to follow-up with infectious disease and is in process of starting his treatment for hepatitis C.  Reports that his counselor did note to him that she felt he was having an eye twitch on his left eye.  States that he had not noticed it prior, states it has happened occasionally since then.  States that he did not have any type of eye twitch prior to starting Abilify.   Past Medical History:  Diagnosis Date   DDD (degenerative disc disease), lumbar    Gastric ulcer    Herniated disc    Larynx cancer (HCC)     Past Surgical History:  Procedure Laterality Date   WISDOM TOOTH EXTRACTION      Family History  Problem Relation Age of Onset   Drug abuse Father     Social History   Socioeconomic History   Marital status: Single    Spouse name: Not on file   Number of children: Not on file   Years of education: Not on file   Highest education level: Not on file  Occupational History   Not on file  Tobacco Use   Smoking status: Former    Packs/day: 2.00    Types: Cigarettes   Smokeless tobacco: Never   Tobacco comments:    Pt  states on a patch for smoking  Vaping Use   Vaping Use: Never used  Substance and Sexual Activity   Alcohol use: No   Drug use: Yes    Types: Marijuana, "Crack" cocaine    Comment: Smokes cannabis occasionally; started snorting coke at age 54 when his father introduced him to it. 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; abstinent since then   Sexual activity: Yes  Other Topics Concern   Not on file  Social History Narrative   Not on file   Social Determinants of Health   Financial Resource Strain: Not on file  Food Insecurity: Not on file  Transportation Needs: Not on file  Physical Activity: Not on file  Stress: Not on file  Social Connections: Not on file  Intimate Partner Violence: Not on file    Outpatient Medications Prior to Visit  Medication Sig Dispense Refill   Glecaprevir-Pibrentasvir (MAVYRET) 100-40 MG TABS Take 3 tablets by mouth daily with breakfast. 84 tablet 1   mupirocin cream (BACTROBAN) 2 % Apply 1 application topically 2 (two) times daily. 15 g 0   triamcinolone cream (KENALOG) 0.1 % Apply 1 application topically 2 (two) times  daily. 30 g 0   ARIPiprazole (ABILIFY) 5 MG tablet Take 1 tablet (5 mg total) by mouth daily. 30 tablet 1   gabapentin (NEURONTIN) 300 MG capsule Take 1 capsule (300 mg total) by mouth 2 (two) times daily. 60 capsule 1   mirtazapine (REMERON) 15 MG tablet Take 1 tablet (15 mg total) by mouth at bedtime. 30 tablet 1   No facility-administered medications prior to visit.    Allergies  Allergen Reactions   Mustard Seed Hives   Prozac [Fluoxetine Hcl] Other (See Comments)    Per pt adverse mental reaction   Wellbutrin [Bupropion] Swelling    "makes him crazy"    ROS Review of Systems  Constitutional:  Negative for chills and fever.  HENT: Negative.    Eyes:  Negative for photophobia, pain, discharge, redness, itching and visual disturbance.  Respiratory:  Negative for  shortness of breath.   Cardiovascular:  Negative for chest pain.  Gastrointestinal: Negative.   Endocrine: Negative.   Genitourinary: Negative.   Musculoskeletal: Negative.   Skin: Negative.   Allergic/Immunologic: Negative.   Neurological:  Negative for tremors.  Hematological: Negative.   Psychiatric/Behavioral:  Negative for dysphoric mood, self-injury, sleep disturbance and suicidal ideas. The patient is not nervous/anxious.      Objective:    Physical Exam Vitals and nursing note reviewed.  Constitutional:      Appearance: Normal appearance.  HENT:     Head: Normocephalic and atraumatic.     Right Ear: External ear normal.     Left Ear: External ear normal.     Nose: Nose normal.     Mouth/Throat:     Mouth: Mucous membranes are moist.     Pharynx: Oropharynx is clear.  Eyes:     Extraocular Movements: Extraocular movements intact.     Conjunctiva/sclera: Conjunctivae normal.     Pupils: Pupils are equal, round, and reactive to light.  Cardiovascular:     Rate and Rhythm: Normal rate and regular rhythm.     Heart sounds: Normal heart sounds.  Pulmonary:     Effort: Pulmonary effort is normal.     Breath sounds: Normal breath sounds.  Musculoskeletal:        General: Normal range of motion.     Cervical back: Normal range of motion and neck supple.  Skin:    General: Skin is warm and dry.     Findings: Acne present.     Comments: Acne lesions in different stages of healing on both cheeks and chin and nose.  Neurological:     General: No focal deficit present.     Mental Status: He is alert and oriented to person, place, and time.  Psychiatric:        Mood and Affect: Mood normal.        Behavior: Behavior normal.        Thought Content: Thought content normal.        Judgment: Judgment normal.    BP 139/86 (BP Location: Left Arm, Patient Position: Sitting, Cuff Size: Normal)    Pulse 88    Temp 98.6 F (37 C) (Oral)    Resp 18    Ht _0  (1.753 m)    Wt 201  lb (91.2 kg)    SpO2 98%    BMI 29.68 kg/m  Wt Readings from Last 3 Encounters:  08/07/21 201 lb (91.2 kg)  07/18/21 192 lb (87.1 kg)  07/10/21 181 lb (82.1 kg)     Health  Maintenance Due  Topic Date Due   COVID-19 Vaccine (1) Never done   TETANUS/TDAP  Never done   INFLUENZA VACCINE  Never done    There are no preventive care reminders to display for this patient.  Lab Results  Component Value Date   TSH 1.920 06/27/2021   Lab Results  Component Value Date   WBC 7.2 06/27/2021   HGB 14.7 06/27/2021   HCT 42.3 06/27/2021   MCV 89 06/27/2021   PLT 234 06/27/2021   Lab Results  Component Value Date   NA 138 06/27/2021   K 4.3 06/27/2021   CO2 28 03/24/2016   GLUCOSE 93 06/27/2021   BUN 15 06/27/2021   CREATININE 0.88 06/27/2021   BILITOT 0.6 06/27/2021   ALKPHOS 89 06/27/2021   AST 41 (H) 06/27/2021   ALT 73 (H) 07/18/2021   PROT 7.5 06/27/2021   ALBUMIN 4.7 06/27/2021   CALCIUM 9.6 06/27/2021   ANIONGAP 9 03/24/2016   EGFR 118 06/27/2021   Lab Results  Component Value Date   CHOL 187 06/27/2021   Lab Results  Component Value Date   HDL 44 06/27/2021   Lab Results  Component Value Date   LDLCALC 106 (H) 06/27/2021   Lab Results  Component Value Date   TRIG 213 (H) 06/27/2021   Lab Results  Component Value Date   CHOLHDL 4.3 06/27/2021   No results found for: HGBA1C    Assessment & Plan:   Problem List Items Addressed This Visit       Digestive   Chronic hepatitis C without hepatic coma (HCC)     Other   Bipolar affective disorder in remission ([place]) - Primary   Relevant Medications   ARIPiprazole (ABILIFY) 5 MG tablet   gabapentin (NEURONTIN) 300 MG capsule   mirtazapine (REMERON) 15 MG tablet   Alcohol abuse   Opioid abuse ([place])   Other Visit Diagnoses     Dermatitis       Eye twitch           Meds ordered this encounter  Medications   ARIPiprazole (ABILIFY) 5 MG tablet    Sig: Take 1 tablet (5 mg total) by mouth daily.     Dispense:  30 tablet    Refill:  1    Order Specific Question:   Supervising Provider    Answer:   Asencion Noble E [[id number]]   gabapentin (NEURONTIN) 300 MG capsule    Sig: Take 1 capsule (300 mg total) by mouth 2 (two) times daily.    Dispense:  60 capsule    Refill:  1    Order Specific Question:   Supervising Provider    Answer:   Asencion Noble E [[id number]]   mirtazapine (REMERON) 15 MG tablet    Sig: Take 1 tablet (15 mg total) by mouth at bedtime.    Dispense:  30 tablet    Refill:  1    Order Specific Question:   Supervising Provider    Answer:   WRIGHT, PATRICK E [[id number]]   1. Bipolar affective disorder in remission [facility]) Continue current regimen.  Patient encouraged to follow-up with medical provider or return to mobile unit in 4 to 6 weeks.  Patient understands and agrees.  Red flags given for prompt reevaluation - ARIPiprazole (ABILIFY) 5 MG tablet; Take 1 tablet (5 mg total) by mouth daily.  Dispense: 30 tablet; Refill: 1 - gabapentin (NEURONTIN) 300 MG capsule; Take 1 capsule (300 mg total) by mouth 2 (two)  times daily.  Dispense: 60 capsule; Refill: 1 - mirtazapine (REMERON) 15 MG tablet; Take 1 tablet (15 mg total) by mouth at bedtime.  Dispense: 30 tablet; Refill: 1  2. Dermatitis Continue current regimen  3. Eye twitch Patient agreeable to watchful waiting, eye twitch was not present during physical exam.  4. Chronic hepatitis C without hepatic coma ([place]) Continue follow-up with infectious disease  5. Alcohol abuse Currently in substance abuse treatment program  6. Opioid abuse ([place])    I have reviewed the patient's medical history (PMH, PSH, Social History, Family History, Medications, and allergies) , and have been updated if relevant. I spent 30 minutes reviewing chart and  face to face time with patient.    Follow-up: Return if symptoms worsen or fail to improve.    Loraine Grip Mayers, PA-C

## 2021-08-07 NOTE — Patient Instructions (Addendum)
I encourage you to continue pain attention to your facial twitch, keeping record of incidences.  If you feel like they are not improving or worsening, please reach out to your medical provider or return to the mobile unit.  Please let us know if there is anything else we can do for you  Kennieth Rad, PA-C Physician Assistant [facility] [email or web address]   Tardive Dyskinesia Tardive dyskinesia is a disorder that causes uncontrollable body movements. It occurs in some people who are taking certain medicines to treat a mental illness (neuroleptic medicine) or have taken this type of medicine in the past. These medicines block the effects of a specific brain chemical (dopamine). Sometimes, tardive dyskinesia starts months or years after someone took the medicine. Not everyone who takes a neuroleptic medicine will get tardive dyskinesia. What are the causes? This condition is caused by changes in your brain that are associated with taking a neuroleptic medicine. What increases the risk? If you are taking a neuroleptic medicine, your risk for tardive dyskinesia may be higher if you: Are taking an older type of neuroleptic medicine. Have been taking the medicine for a long time at a high dose. Are a woman past the age of menopause. Are older than 60. Have a history of alcohol or drug abuse. What are the signs or symptoms? Abnormal, uncontrollable movements are the main symptom of tardive dyskinesia. These types of movements may include: Grimacing. Sticking out or twisting your tongue. Making chewing or sucking sounds. Making grunting or sighing noises. Blinking your eyes. Twisting, swaying, or thrusting your body. Foot tapping or finger waving. Rapid movements of your arms or legs. How is this diagnosed? Your health care provider may suspect that you have tardive dyskinesia if: You have been taking neuroleptic medicines. You have  abnormal movements that you cannot control. If you are taking a medicine that can cause tardive dyskinesia, your health care provider may screen you for early signs of the condition. This may include: Observing your body movements. Using a specific rating scale called the Abnormal Involuntary Movement Scale (AIMS). You may also have tests to rule out other conditions that cause abnormal body movements, including: Parkinson's disease. Huntington's disease. Stroke. How is this treated? The best treatment for tardive dyskinesia is to lower the dose of your medicine or to switch to a different medicine at the first sign of abnormal and uncontrolled movements. There is no cure for long-term (chronic) tardive dyskinesia. Some medicines may help control the movements. These include: Clozapine, a medicine used to treat mental illness (antipsychotic). Some muscle relaxants. Some anti-seizure medicines. Some medicines used to treat high blood pressure. Some tranquilizers (sedatives). Follow these instructions at home:   Take over-the-counter and prescription medicines only as told by your health care provider. Do not stop or start taking any medicines without talking to your health care provider first. Do not abuse drugs or alcohol. Keep all follow-up visits as told by your health care provider. This is important. Contact a health care provider if: You are unable to eat or drink. You have had a fall. Your symptoms get worse. Summary Tardive dyskinesia is a disorder that causes uncontrollable body movements. These may include grimacing, sticking out or twisting your tongue, blinking your eyes, or rapid movements of your arms or legs. The condition occurs in some people who are taking certain medicines to treat a mental illness (neuroleptic medicine) or have taken this type of medicine in the past. The best treatment for tardive  dyskinesia is to lower the dose of your medicine or to switch to a  different medicine at the first sign of abnormal and uncontrolled movements. There is no cure for long-term (chronic) tardive dyskinesia, but some medicines may help control the movements. This information is not intended to replace advice given to you by your health care provider. Make sure you discuss any questions you have with your health care provider. Document Revised: 02/23/2021 Document Reviewed: 07/11/2017 Elsevier Patient Education  2022 [employer].

## 2021-08-08 ENCOUNTER — Other Ambulatory Visit (HOSPITAL_COMMUNITY): Payer: Self-pay

## 2021-08-08 ENCOUNTER — Telehealth: Payer: Self-pay

## 2021-08-08 DIAGNOSIS — F1110 Opioid abuse, uncomplicated: Secondary | ICD-10-CM | POA: Insufficient documentation

## 2021-08-08 NOTE — Telephone Encounter (Signed)
[facility] Patient Advocate Encounter  Patient's medication Engineer, maintenance (IT)) have been couriered to [facility] from [facility] and will be picked up .  David Andrews , [facility] Patient [facility] Phone: [phone] Fax:  [phone]

## 2021-08-09 ENCOUNTER — Telehealth: Payer: Self-pay

## 2021-08-09 NOTE — Telephone Encounter (Signed)
[facility] Patient Advocate Encounter  I have been unsuccsessful in reaching patient to be able to make a 1 month follow up and to pick up medication from the office . (MAVYRET)    We have tried multiple times without a response.  Ileene Patrick, [facility] Patient [facility] Phone: [phone] Fax:  [phone]

## 2021-08-09 NOTE — Telephone Encounter (Signed)
Sorry - meant to add you to this notification. FYI Dr. West Bali. Not sure how long patient will be in [facility] prior to starting treatment. Would recommend waiting until he is out of rehab. Thanks!

## 2021-08-09 NOTE — Telephone Encounter (Signed)
FYI Dr. West Bali. Not sure how long patient will be in [facility] prior to starting treatment.

## 2021-08-15 ENCOUNTER — Other Ambulatory Visit: Payer: Self-pay

## 2021-08-15 ENCOUNTER — Encounter: Payer: Self-pay | Admitting: Infectious Disease

## 2021-08-15 ENCOUNTER — Ambulatory Visit (INDEPENDENT_AMBULATORY_CARE_PROVIDER_SITE_OTHER): Payer: Medicaid Other | Admitting: Infectious Disease

## 2021-08-15 VITALS — BP 120/81 | HR 74 | Temp 97.9°F | Resp 16 | Ht 71.0 in | Wt 204.0 lb

## 2021-08-15 DIAGNOSIS — R768 Other specified abnormal immunological findings in serum: Secondary | ICD-10-CM | POA: Diagnosis not present

## 2021-08-15 DIAGNOSIS — B182 Chronic viral hepatitis C: Secondary | ICD-10-CM

## 2021-08-15 DIAGNOSIS — F1990 Other psychoactive substance use, unspecified, uncomplicated: Secondary | ICD-10-CM | POA: Diagnosis not present

## 2021-08-15 DIAGNOSIS — Z7185 Encounter for immunization safety counseling: Secondary | ICD-10-CM

## 2021-08-15 NOTE — Progress Notes (Signed)
Patient is approved to receive Mavyret x 8 weeks for chronic Hepatitis C infection. Counseled patient to take all three tablets of Mavyret daily with food.  Counseled patient the need to take all three tablets together and to not separate them out during the day. Encouraged patient not to miss any doses and explained how their chance of cure could go down with each dose missed. Counseled patient on what to do if dose is missed - if it is closer to the missed dose take immediately; if closer to next dose then skip dose and take the next dose at the usual time. Counseled patient on common side effects such as headache, fatigue, and nausea and that these normally decrease with time. I reviewed patient medications and found no drug interactions. Discussed with patient that there are several drug interactions with Mavyret and instructed patient to call the clinic if he wishes to start a new medication during course of therapy. Also advised patient to call if he experiences any side effects. Patient will follow-up with Dr. Tommy Medal on 3/10.   Of note, patient states he is not at [facility] anymore but is at a new rehab. He stated he will be able to follow-up with Dr. Tommy Medal while on therapy and will be able to independently track his medication.  Alfonse Spruce, PharmD, CPP Clinical Pharmacist Practitioner [facility]

## 2021-08-15 NOTE — Progress Notes (Signed)
Subjective:  Chief complaint follow-up for chronic hepatitis C without hepatic coma with some fatigue  Patient ID: David Andrews, male    DOB: 1990-11-28, 31 y.o.   MRN: [id number]  HPI  David Andrews is a 31 year old Caucasian man with a history of IV drug abuse including fentanyl who has gone through rehab he is also got chronic hepatitis C without hepatic coma.  We have [place] here for him to begin but he was not reachable by cell phone while he was undergoing rehabilitation for his drug addiction.  He is ready to start therapy today.  In reviewing his labs and noted that his hepatitis B core antibody was positive though surface antigen negative and surface antibody positive.  His hepatitis A antibodies are negative.    Past Medical History:  Diagnosis Date   DDD (degenerative disc disease), lumbar    Gastric ulcer    Herniated disc    Larynx cancer (HCC)     Past Surgical History:  Procedure Laterality Date   WISDOM TOOTH EXTRACTION      Family History  Problem Relation Age of Onset   Drug abuse Father       Social History   Socioeconomic History   Marital status: Single    Spouse name: Not on file   Number of children: Not on file   Years of education: Not on file   Highest education level: Not on file  Occupational History   Not on file  Tobacco Use   Smoking status: Every Day    Packs/day: 1.00    Types: Cigarettes   Smokeless tobacco: Never  Vaping Use   Vaping Use: Never used  Substance and Sexual Activity   Alcohol use: No   Drug use: Yes    Types: Marijuana, "Crack" cocaine    Comment: Smokes cannabis occasionally; started snorting coke at age 66 when his father introduced him to it. 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; abstinent since then   Sexual activity: Yes  Other Topics Concern   Not on file  Social History Narrative   Not on file   Social Determinants of Health    Financial Resource Strain: Not on file  Food Insecurity: Not on file  Transportation Needs: Not on file  Physical Activity: Not on file  Stress: Not on file  Social Connections: Not on file    Allergies  Allergen Reactions   Mustard Seed Hives   Prozac [Fluoxetine Hcl] Other (See Comments)    Per pt adverse mental reaction   Wellbutrin [Bupropion] Swelling    "makes him crazy"     Current Outpatient Medications:    ARIPiprazole (ABILIFY) 5 MG tablet, Take 1 tablet (5 mg total) by mouth daily., Disp: 30 tablet, Rfl: 1   gabapentin (NEURONTIN) 300 MG capsule, Take 1 capsule (300 mg total) by mouth 2 (two) times daily., Disp: 60 capsule, Rfl: 1   mirtazapine (REMERON) 15 MG tablet, Take 1 tablet (15 mg total) by mouth at bedtime., Disp: 30 tablet, Rfl: 1   Glecaprevir-Pibrentasvir (MAVYRET) 100-40 MG TABS, Take 3 tablets by mouth daily with breakfast. (Patient not taking: Reported on 08/15/2021), Disp: 84 tablet, Rfl: 1   mupirocin cream (BACTROBAN) 2 %, Apply 1 application topically 2 (two) times daily. (Patient not taking: Reported on 08/15/2021), Disp: 15 g, Rfl: 0   triamcinolone cream (KENALOG) 0.1 %, Apply 1 application topically 2 (two) times daily. (Patient  not taking: Reported on 08/15/2021), Disp: 30 g, Rfl: 0   Review of Systems  Constitutional:  Positive for fatigue. Negative for activity change, appetite change, chills, diaphoresis, fever and unexpected weight change.  HENT:  Negative for congestion, rhinorrhea, sinus pressure, sneezing, sore throat and trouble swallowing.   Eyes:  Negative for photophobia and visual disturbance.  Respiratory:  Negative for cough, chest tightness, shortness of breath, wheezing and stridor.   Cardiovascular:  Negative for chest pain, palpitations and leg swelling.  Gastrointestinal:  Negative for abdominal distention, abdominal pain, anal bleeding, blood in stool, constipation, diarrhea, nausea and vomiting.  Genitourinary:  Negative for  difficulty urinating, dysuria, flank pain and hematuria.  Musculoskeletal:  Negative for arthralgias, back pain, gait problem, joint swelling and myalgias.  Skin:  Negative for color change, pallor, rash and wound.  Neurological:  Negative for dizziness, tremors, weakness and light-headedness.  Hematological:  Negative for adenopathy. Does not bruise/bleed easily.  Psychiatric/Behavioral:  Negative for agitation, behavioral problems, confusion, decreased concentration, dysphoric mood and sleep disturbance.       Objective:   Physical Exam Constitutional:      Appearance: He is well-developed.  HENT:     Head: Normocephalic and atraumatic.  Eyes:     Conjunctiva/sclera: Conjunctivae normal.  Cardiovascular:     Rate and Rhythm: Normal rate and regular rhythm.  Pulmonary:     Effort: Pulmonary effort is normal. No respiratory distress.     Breath sounds: No wheezing.  Abdominal:     General: There is no distension.     Palpations: Abdomen is soft.  Musculoskeletal:        General: No tenderness. Normal range of motion.     Cervical back: Normal range of motion and neck supple.  Skin:    General: Skin is warm and dry.     Coloration: Skin is not pale.     Findings: No erythema or rash.  Neurological:     General: No focal deficit present.     Mental Status: He is alert and oriented to person, place, and time.  Psychiatric:        Mood and Affect: Mood normal.        Behavior: Behavior normal.        Thought Content: Thought content normal.        Judgment: Judgment normal.          Assessment & Plan:   Hepatitis C without hepatic coma:  We will initiate Mavyret.  We will bring back before his 28 days are complete to make sure he has the next month supply on hand I will check a hep C RNA at that time  Isolated hepatitis B core antibody likely reflects past infection but I will check hepatitis B DNA and thoroughness.  History of IV drug use in remission   Vaccine  counseling he does need vaccination against hepatitis A since he does not have antibodies but did not want the shot today.

## 2021-08-17 LAB — HEPATITIS B DNA, ULTRAQUANTITATIVE, PCR
Hepatitis B DNA (Calc): <1 Log IU/mL
Hepatitis B DNA: <10 IU/mL

## 2021-08-28 ENCOUNTER — Other Ambulatory Visit (HOSPITAL_COMMUNITY): Payer: Self-pay

## 2021-08-30 ENCOUNTER — Other Ambulatory Visit (HOSPITAL_COMMUNITY): Payer: Self-pay

## 2021-09-01 ENCOUNTER — Other Ambulatory Visit (HOSPITAL_COMMUNITY): Payer: Self-pay

## 2021-09-08 ENCOUNTER — Ambulatory Visit: Payer: Medicaid Other | Admitting: Infectious Disease

## 2021-09-08 ENCOUNTER — Other Ambulatory Visit (HOSPITAL_COMMUNITY): Payer: Self-pay

## 2021-09-08 ENCOUNTER — Telehealth: Payer: Self-pay | Admitting: Physician Assistant

## 2021-09-08 NOTE — Telephone Encounter (Signed)
Pt left a message on our VM in regards to needing a bridge on his medications, but he did not say which ones. Please contact pt ?

## 2021-09-11 ENCOUNTER — Telehealth: Payer: Self-pay

## 2021-09-11 NOTE — Telephone Encounter (Signed)
MA UTR patient due to contact being disconnected. Communication letter will be sent to address on file in attempt to meet patients needs.

## 2021-09-11 NOTE — Telephone Encounter (Signed)
[facility] Patient Advocate Encounter ? ?Patient's medications have been couriered to [facility] from [place] and will be picked up 09/12/21. ? ?Ileene Patrick , CPhT ?Specialty Pharmacy Patient Advocate ?[place] ?Phone: [phone] ?Fax:  [phone]  ?

## 2021-09-28 ENCOUNTER — Other Ambulatory Visit (HOSPITAL_COMMUNITY): Payer: Self-pay

## 2021-10-04 IMAGING — CR DG CHEST 2V
2 series · 2 of 2 positions shown · non-contrast
Comparison: 11/24/2015

CLINICAL DATA: Cough, congestion, shortness of breath

EXAM:
CHEST - 2 VIEW

[chest pa]
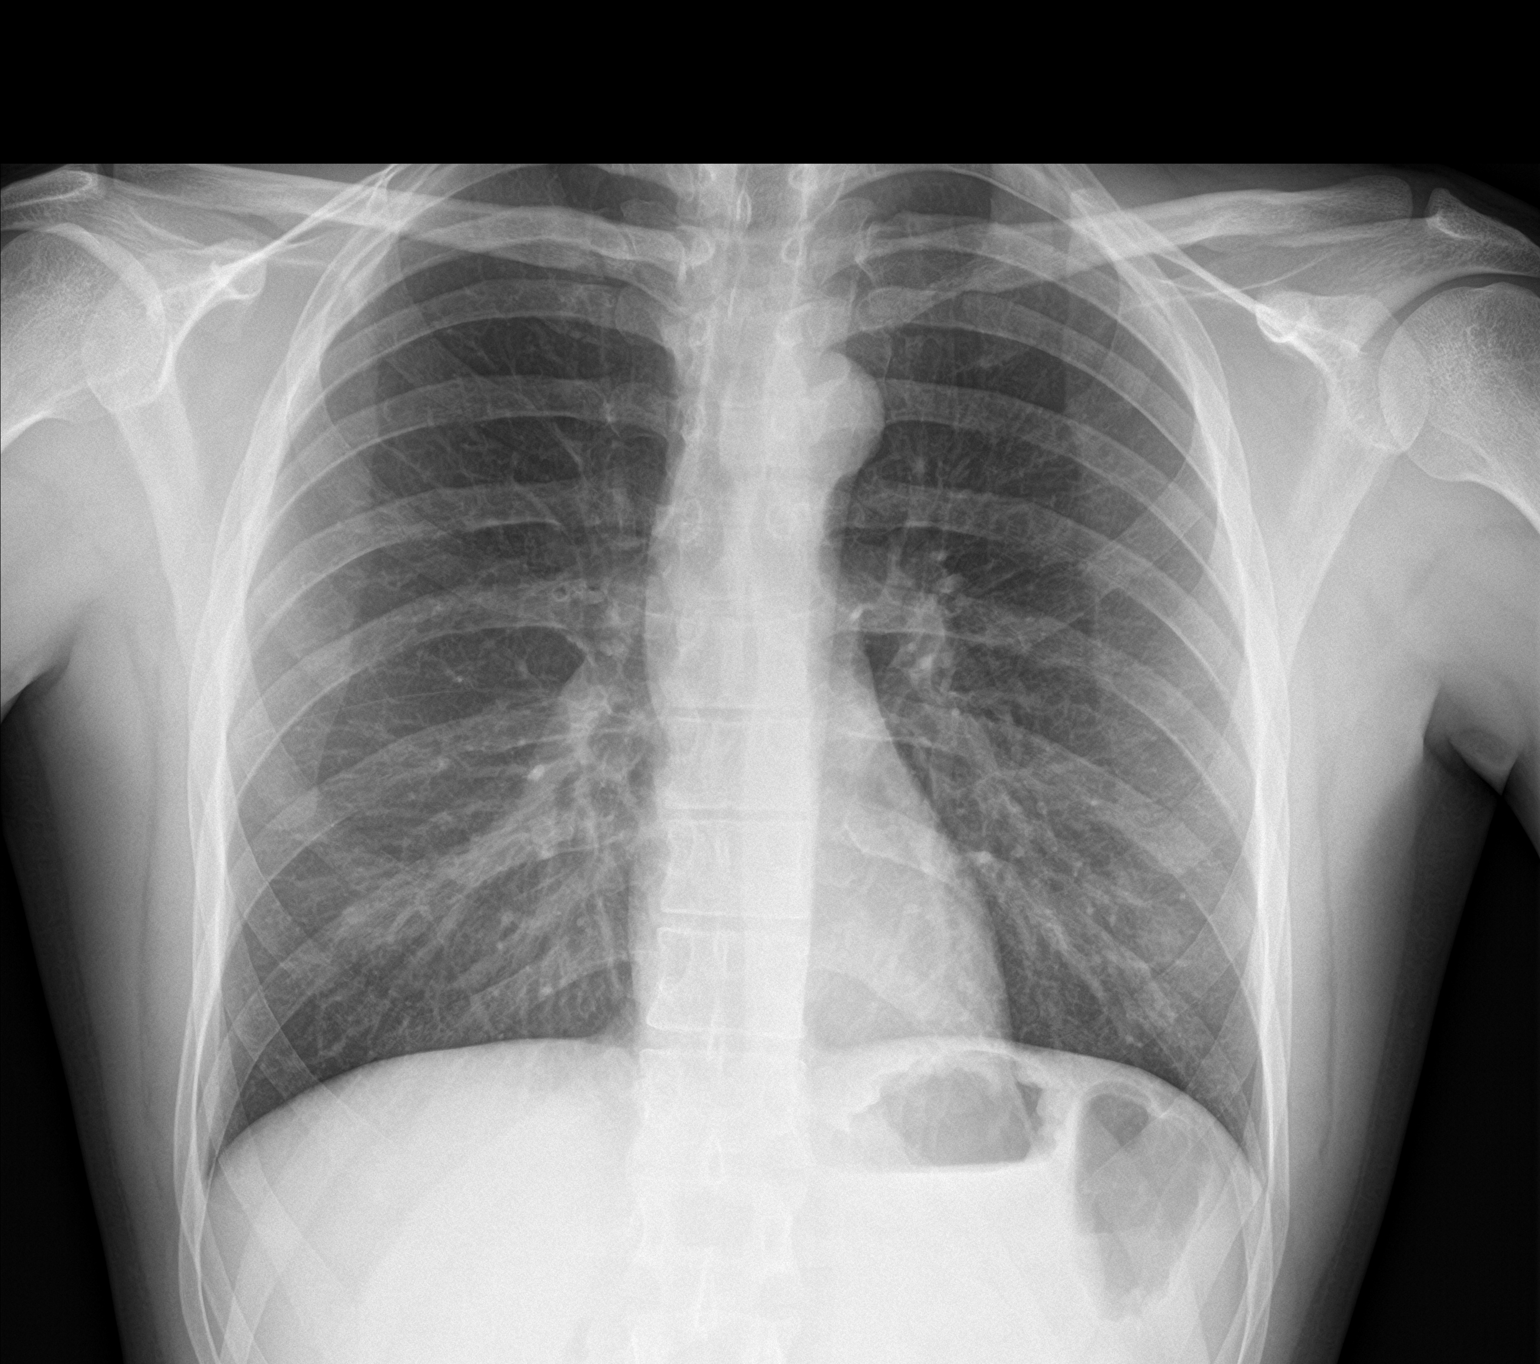

[chest lat]
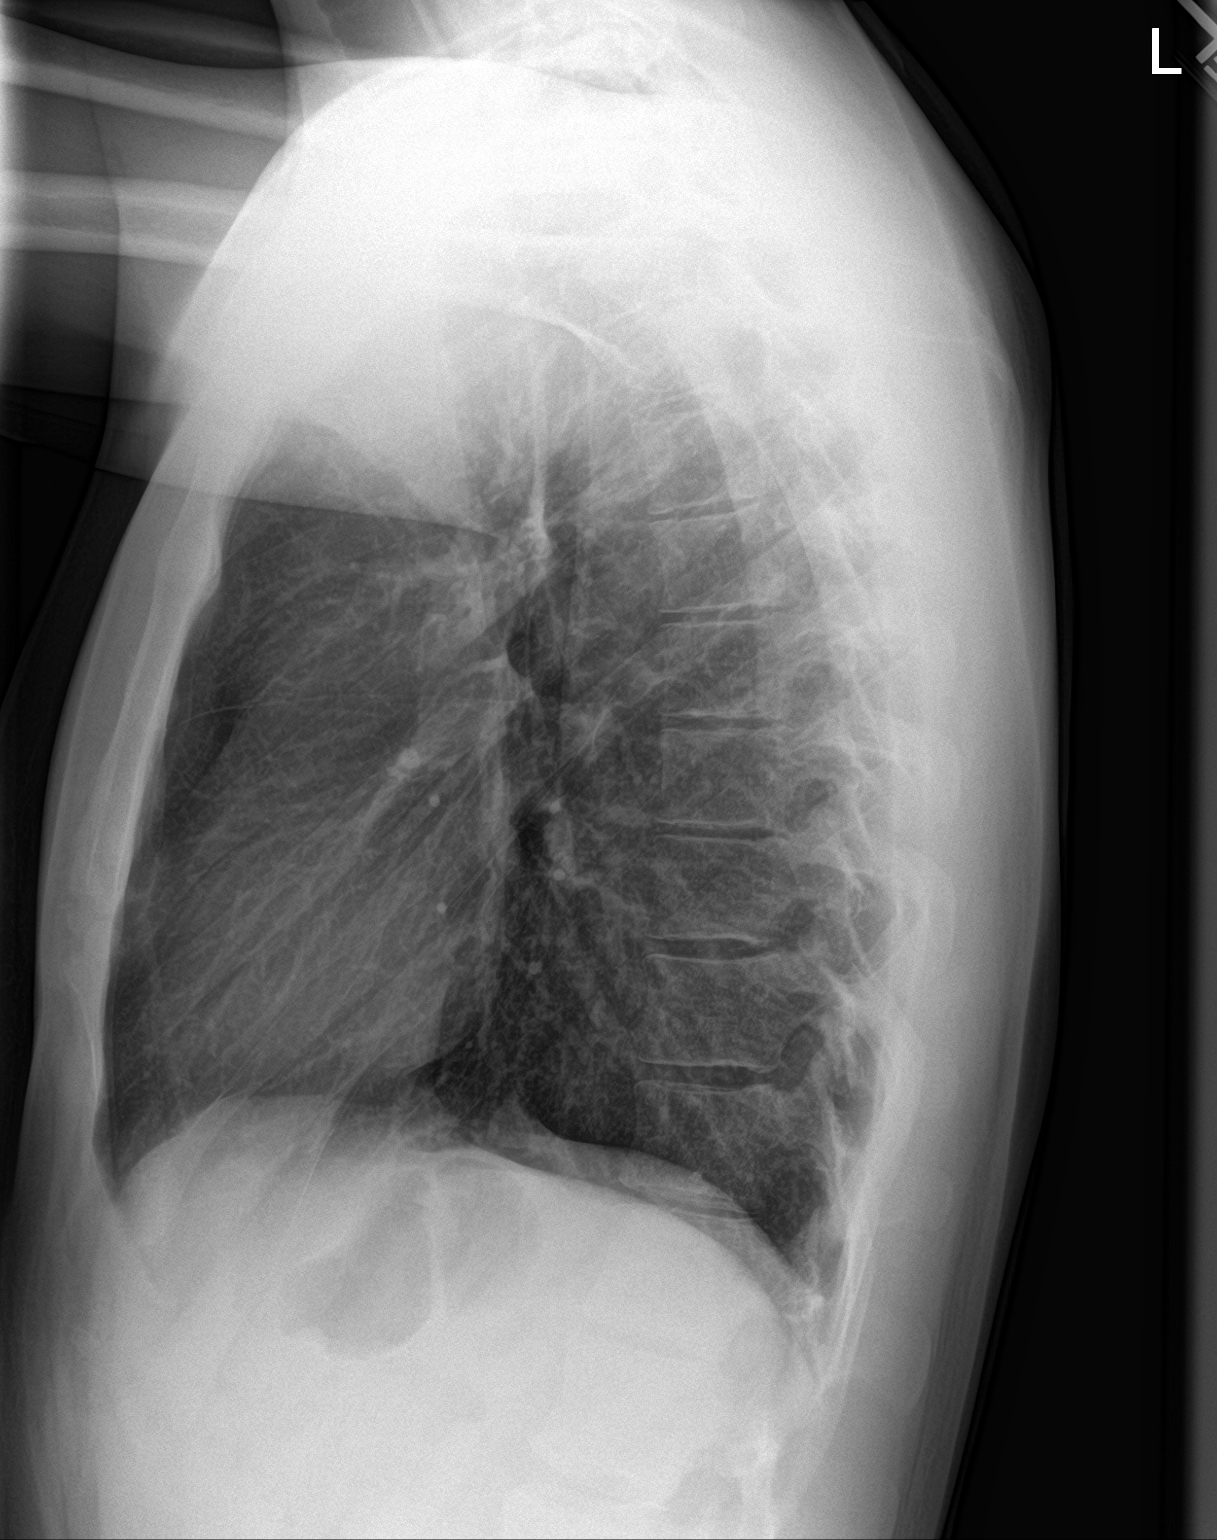

[2 of 2 positions shown; findings below may reference images not displayed]

FINDINGS: The heart size and mediastinal contours are within normal limits.
Both lungs are clear. The visualized skeletal structures are
unremarkable.
IMPRESSION: No acute abnormality of the lungs.

## 2022-07-13 ENCOUNTER — Other Ambulatory Visit (HOSPITAL_COMMUNITY): Payer: Self-pay

## 2023-09-07 IMAGING — US US ABDOMEN COMPLETE W/ ELASTOGRAPHY
1 series · 12 of 25 positions shown · non-contrast
Comparison: None.

CLINICAL DATA: Hepatitis-C

EXAM:
ULTRASOUND ABDOMEN
ULTRASOUND HEPATIC ELASTOGRAPHY
TECHNIQUE: Sonography of the upper abdomen was performed. In addition,
ultrasound elastography evaluation of the liver was performed. A
region of interest was placed within the right lobe of the liver.
Following application of a compressive sonographic pulse, tissue
compressibility was assessed. Multiple assessments were performed at
the selected site. Median tissue compressibility was determined.
Previously, hepatic stiffness was assessed by shear wave velocity.
Based on recently published Society of [employer]iologists in Ultrasound
consensus article, reporting is now recommended to be performed in
the SI units of pressure (kiloPascals) representing hepatic
stiffness/elasticity. The obtained result is compared to the
published reference standards. (cACLD = compensated Advanced Chronic
Liver Disease)

[Series 1: us abdomen complete w/ elastography · 12 of 122 slices shown]
[im 6/122]
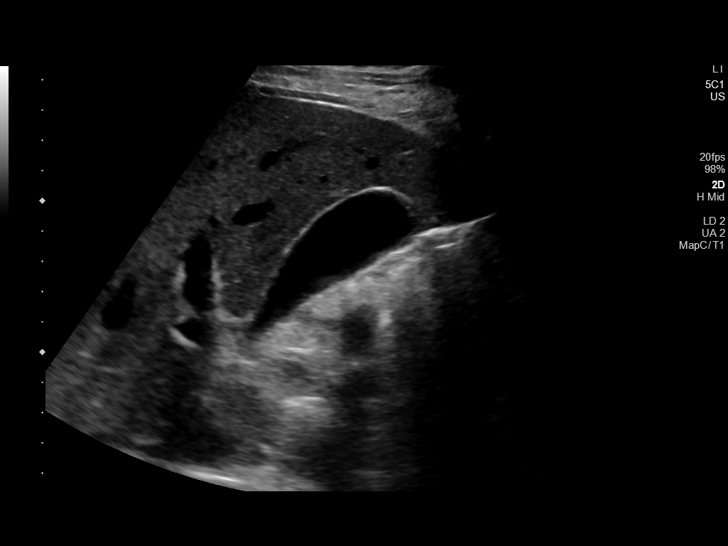
[im 16/122]
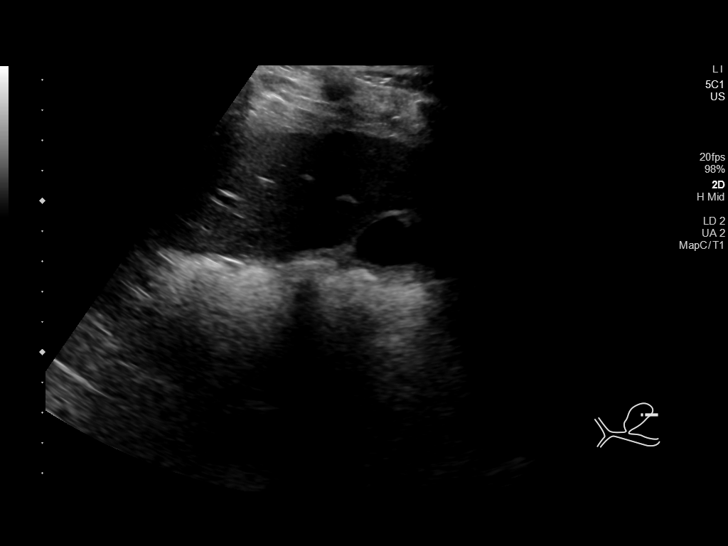
[im 26/122]
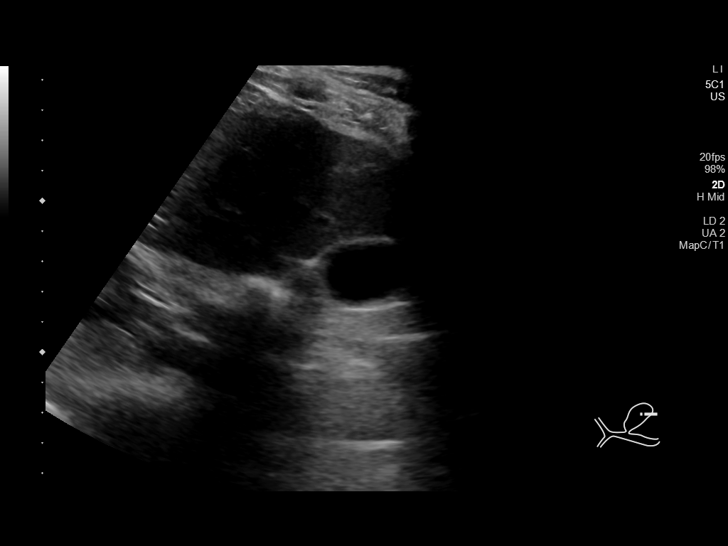
[im 36/122]
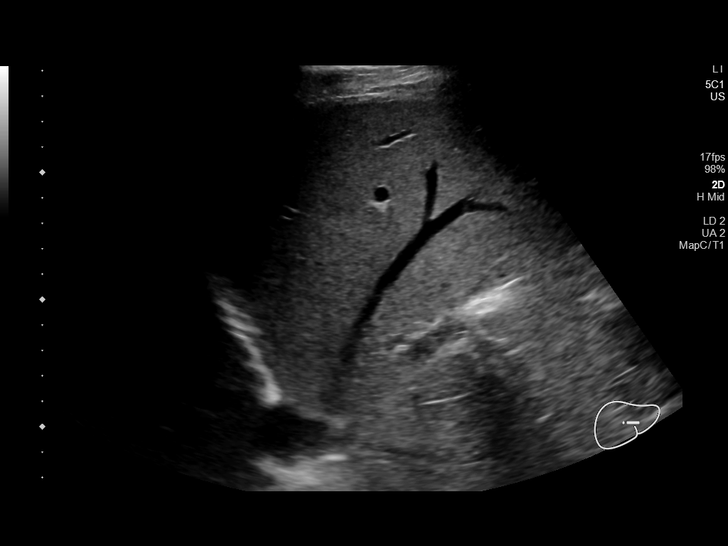
[im 46/122]
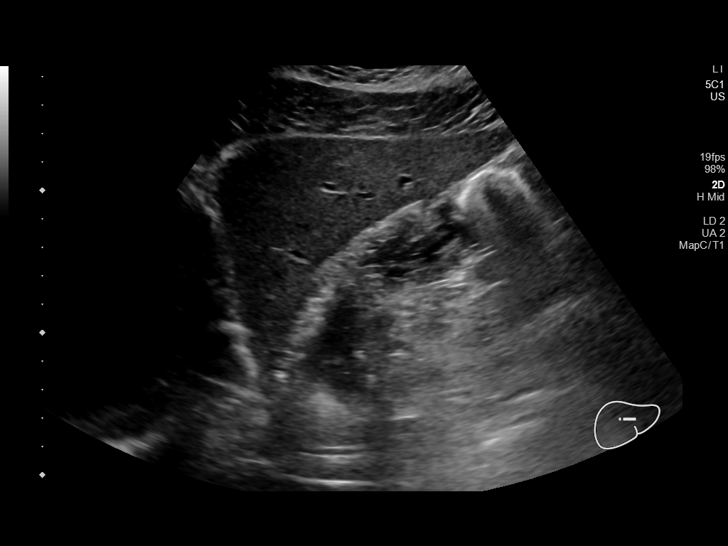
[im 56/122]
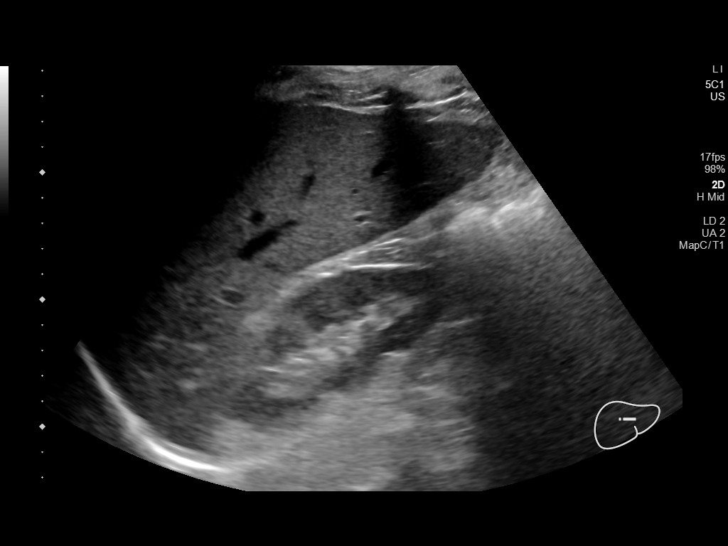
[im 66/122]
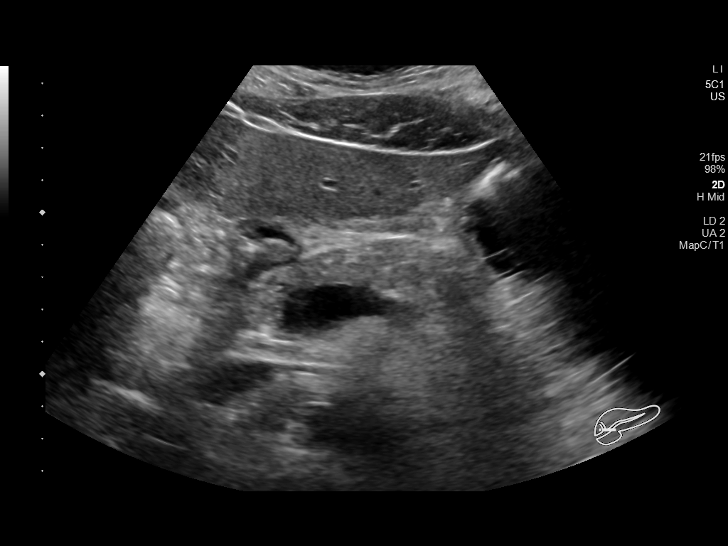
[im 76/122]
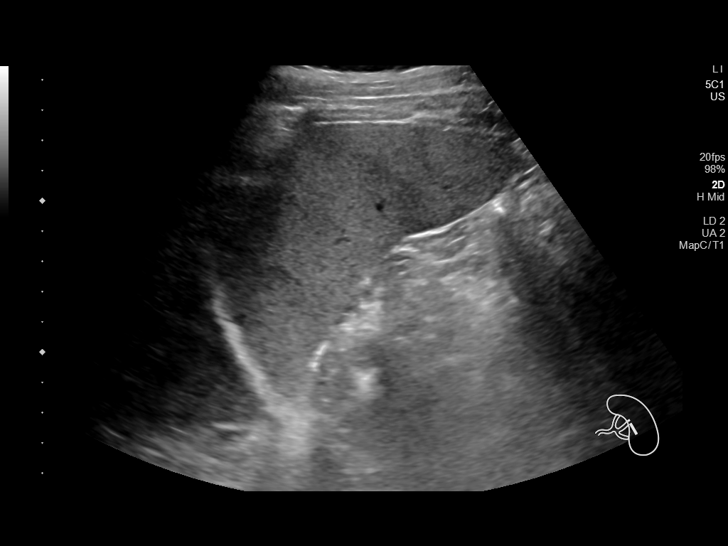
[im 86/122]
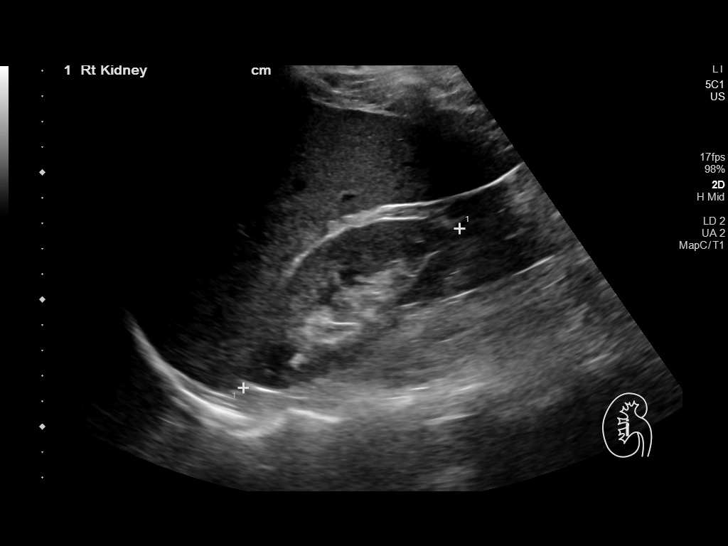
[im 96/122]
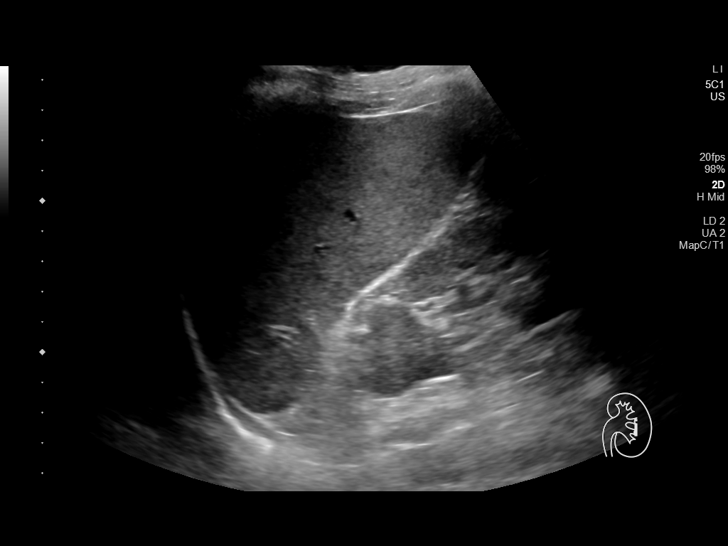
[im 106/122]
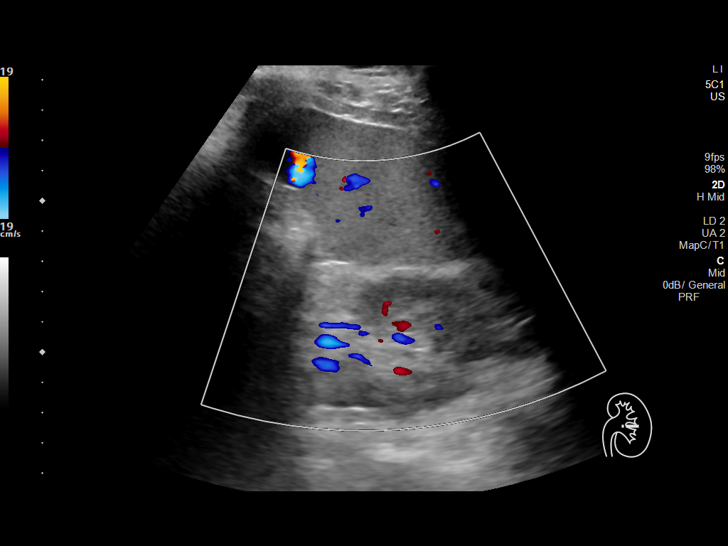
[im 116/122]
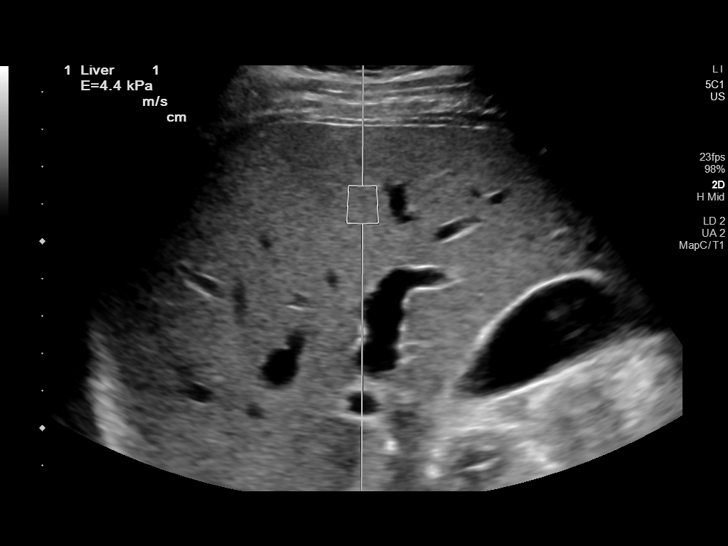

[12 of 25 positions shown; findings below may reference images not displayed]

FINDINGS: ULTRASOUND ABDOMEN

Gallbladder: No gallstones or wall thickening visualized. No
sonographic Murphy sign noted by sonographer.

Common bile duct: Diameter: Normal caliber, 4 mm

Liver: No focal lesion identified. Within normal limits in
parenchymal echogenicity. Portal vein is patent on color Doppler
imaging with normal direction of blood flow towards the liver.

IVC: No abnormality visualized.

Pancreas: Visualized portion unremarkable.

Spleen: Size and appearance within normal limits.

Right Kidney: Length: 10.6 cm. Echogenicity within normal limits. No
mass or hydronephrosis visualized.

Left Kidney: Length: 11.5 cm. Echogenicity within normal limits. No
mass or hydronephrosis visualized.

Abdominal aorta: No aneurysm visualized.

Other findings: None.

ULTRASOUND HEPATIC ELASTOGRAPHY

Device: Siemens Helix VTQ

Patient position: Supine

Transducer 5C1

Number of measurements: 10

Hepatic segment:  8

Median kPa:

IQR:

IQR/Median kPa ratio:

Data quality:  Good

Diagnostic category:  < or = 5 kPa: high probability of being normal

The use of hepatic elastography is applicable to patients with viral
hepatitis and non-alcoholic fatty liver disease. At this time, there
is insufficient data for the referenced cut-off values and use in
other causes of liver disease, including alcoholic liver disease.
Patients, however, may be assessed by elastography and serve as
their own reference standard/baseline.

In patients with non-alcoholic liver disease, the values suggesting
compensated advanced chronic liver disease (cACLD) may be lower, and
patients may need additional testing with elasticity results of [DATE]
kPa.

Please note that abnormal hepatic elasticity and shear wave
velocities may also be identified in clinical settings other than
with hepatic fibrosis, such as: acute hepatitis, elevated right
heart and central venous pressures including use of beta blockers,
Etoil disease ([place]), infiltrative processes such as
mastocytosis/amyloidosis/infiltrative tumor/lymphoma, extrahepatic
cholestasis, with hyperemia in the post-prandial state, and with
liver transplantation. Correlation with patient history, laboratory
data, and clinical condition recommended.

Diagnostic Categories:

< or =5 kPa: high probability of being normal

< or =9 kPa: in the absence of other known clinical signs, rules [DATE] kPa and ?13 kPa: suggestive of cACLD, but needs further testing

>13 kPa: highly suggestive of cACLD

> or =17 kPa: highly suggestive of cACLD with an increased
probability of clinically significant portal hypertension
IMPRESSION: ULTRASOUND ABDOMEN:

No acute findings

ULTRASOUND HEPATIC ELASTOGRAPHY:

Median kPa:

Diagnostic category:  < or = 5 kPa: high probability of being normal

## 2023-11-29 ENCOUNTER — Emergency Department (HOSPITAL_COMMUNITY): Payer: MEDICAID

## 2023-11-29 ENCOUNTER — Other Ambulatory Visit: Payer: Self-pay

## 2023-11-29 ENCOUNTER — Emergency Department (HOSPITAL_COMMUNITY)
Admission: EM | Admit: 2023-11-29 | Discharge: 2023-11-29 | Disposition: A | Discharge status: Home / Self Care | Payer: MEDICAID | Attending: Emergency Medicine | Admitting: Emergency Medicine

## 2023-11-29 ENCOUNTER — Encounter (HOSPITAL_COMMUNITY): Payer: Self-pay

## 2023-11-29 DIAGNOSIS — R531 Weakness: Secondary | ICD-10-CM | POA: Insufficient documentation

## 2023-11-29 DIAGNOSIS — E860 Dehydration: Secondary | ICD-10-CM | POA: Diagnosis not present

## 2023-11-29 DIAGNOSIS — Z5900 Homelessness unspecified: Secondary | ICD-10-CM | POA: Diagnosis not present

## 2023-11-29 DIAGNOSIS — F419 Anxiety disorder, unspecified: Secondary | ICD-10-CM | POA: Diagnosis present

## 2023-11-29 LAB — BASIC METABOLIC PANEL WITH GFR
Anion gap: 10 (ref 5–15)
BUN: 10 mg/dL (ref 6–20)
CO2: 30 mmol/L (ref 22–32)
Calcium: 9.1 mg/dL (ref 8.9–10.3)
Chloride: 93 mmol/L — ABNORMAL LOW (ref 98–111)
Creatinine, Ser: 0.76 mg/dL (ref 0.61–1.24)
GFR, Estimated: >60 mL/min (ref >60)
Glucose, Bld: 99 mg/dL (ref 70–99)
Potassium: 3.1 mmol/L — ABNORMAL LOW (ref 3.5–5.1)
Sodium: 133 mmol/L — ABNORMAL LOW (ref 135–145)

## 2023-11-29 LAB — URINALYSIS, ROUTINE W REFLEX MICROSCOPIC
Bacteria, UA: NONE SEEN
Bilirubin Urine: NEGATIVE
Glucose, UA: NEGATIVE mg/dL
Hgb urine dipstick: NEGATIVE
Ketones, ur: NEGATIVE mg/dL
Leukocytes,Ua: NEGATIVE
Nitrite: NEGATIVE
Protein, ur: 30 mg/dL — AB
Specific Gravity, Urine: 1.021 (ref 1.005–1.030)
pH: 5 (ref 5.0–8.0)

## 2023-11-29 LAB — CBC
HCT: 44.7 % (ref 39.0–52.0)
Hemoglobin: 15.5 g/dL (ref 13.0–17.0)
MCH: 30.7 pg (ref 26.0–34.0)
MCHC: 34.7 g/dL (ref 30.0–36.0)
MCV: 88.5 fL (ref 80.0–100.0)
Platelets: 292 10*3/uL (ref 150–400)
RBC: 5.05 MIL/uL (ref 4.22–5.81)
RDW: 11.9 % (ref 11.5–15.5)
WBC: 7.2 10*3/uL (ref 4.0–10.5)
nRBC: 0 % (ref 0.0–0.2)

## 2023-11-29 MED ORDER — SODIUM CHLORIDE 0.9 % IV BOLUS
1000.0000 mL | Freq: Once | INTRAVENOUS | Status: AC
  Administered 2023-11-29: 1000 mL via INTRAVENOUS

## 2023-11-29 MED ORDER — PANTOPRAZOLE SODIUM 40 MG IV SOLR
40.0000 mg | Freq: Once | INTRAVENOUS | Status: AC
  Administered 2023-11-29: 40 mg via INTRAVENOUS
  Filled 2023-11-29: qty 10

## 2023-11-29 MED ORDER — ONDANSETRON 4 MG PO TBDP
4.0000 mg | ORAL_TABLET | ORAL | 0 refills | Status: DC | PRN
  Filled 2023-11-29 – 2023-11-30 (×2): qty 12, 2d supply, fill #0

## 2023-11-29 MED ORDER — HYDROXYZINE HCL 25 MG PO TABS
25.0000 mg | ORAL_TABLET | Freq: Three times a day (TID) | ORAL | 0 refills | Status: DC | PRN
  Filled 2023-11-29 – 2023-11-30 (×2): qty 20, 7d supply, fill #0

## 2023-11-29 MED ORDER — ONDANSETRON HCL 4 MG/2ML IJ SOLN
4.0000 mg | Freq: Once | INTRAMUSCULAR | Status: AC
  Administered 2023-11-29: 4 mg via INTRAVENOUS
  Filled 2023-11-29: qty 2

## 2023-11-29 MED ORDER — LORAZEPAM 2 MG/ML IJ SOLN
1.0000 mg | Freq: Once | INTRAMUSCULAR | Status: AC
  Administered 2023-11-29: 1 mg via INTRAVENOUS
  Filled 2023-11-29: qty 1

## 2023-11-29 NOTE — ED Triage Notes (Addendum)
 BIB EMS, pt is homeless. Pt used IV fentanyl 4 hours ago and now is feeling generalized weakness and has no appetite, pt has been feeling this way since last Tuesday. Blood sugar 107 with EMS. Pt is c/o right sided flank pain and states he feels dehydrated.

## 2023-11-29 NOTE — Discharge Instructions (Signed)
Follow-up with your family doctor if not getting better

## 2023-11-30 ENCOUNTER — Encounter (HOSPITAL_COMMUNITY): Payer: Self-pay

## 2023-11-30 ENCOUNTER — Other Ambulatory Visit: Payer: Self-pay

## 2023-11-30 ENCOUNTER — Emergency Department (HOSPITAL_COMMUNITY)
Admission: EM | Admit: 2023-11-30 | Discharge: 2023-11-30 | Disposition: A | Discharge status: Home / Self Care | Payer: MEDICAID | Attending: Emergency Medicine | Admitting: Emergency Medicine

## 2023-11-30 ENCOUNTER — Other Ambulatory Visit (HOSPITAL_COMMUNITY): Payer: Self-pay

## 2023-11-30 DIAGNOSIS — R110 Nausea: Secondary | ICD-10-CM

## 2023-11-30 DIAGNOSIS — F419 Anxiety disorder, unspecified: Secondary | ICD-10-CM | POA: Diagnosis not present

## 2023-11-30 DIAGNOSIS — R112 Nausea with vomiting, unspecified: Secondary | ICD-10-CM | POA: Diagnosis present

## 2023-11-30 NOTE — ED Triage Notes (Signed)
 Pt states that he has a lot going on and just wants to feel better. Pt recently discharged.

## 2023-11-30 NOTE — Discharge Instructions (Signed)
Return for any new or worsening symptoms.

## 2023-11-30 NOTE — ED Provider Notes (Signed)
 [place] EMERGENCY DEPARTMENT AT [facility] Provider Note   CSN: [id number] Arrival date & time: 11/30/23  1111     History  Chief Complaint  Patient presents with   Anxiety    David Andrews is a 33 y.o. male with a history of social stressors and polysubstance abuse who presents emergency department erroneously.  Patient reports that he was discharged earlier.  He had gone to [facility] to pick up medications for his nausea and vomiting for which she was seen.  The pharmacy was closed and he ended up going to another pharmacy to pick this up where they were actually called in.  Patient reports he stopped in the ER to charge his phone and see if he get a bus pass to go home and then was erroneously checked in by the registration staff because he was told he had to if he was going to sit in the lobby.  Patient reports he has no complaints right now.   Anxiety       Home Medications Prior to Admission medications   Medication Sig Start Date End Date Taking? Authorizing Provider  ARIPiprazole  (ABILIFY ) 5 MG tablet Take 1 tablet (5 mg total) by mouth daily. 08/07/21   Mayers, Cari S, PA-C  gabapentin  (NEURONTIN ) 300 MG capsule Take 1 capsule (300 mg total) by mouth 2 (two) times daily. 08/07/21 09/06/21  Mayers, Cari S, PA-C  Glecaprevir -Pibrentasvir  (MAVYRET ) 100-40 MG TABS Take 3 tablets by mouth daily with breakfast. Patient not taking: Reported on 08/15/2021 08/04/21   Sonya Duster, RPH-CPP  hydrOXYzine  (ATARAX ) 25 MG tablet Take 1 tablet (25 mg total) by mouth every 8 (eight) to 12 (twelve) hours as needed for anxiety. 11/29/23   Zammit, Joseph, MD  mirtazapine  (REMERON ) 15 MG tablet Take 1 tablet (15 mg total) by mouth at bedtime. 08/07/21   Mayers, Cari S, PA-C  mupirocin  cream (BACTROBAN ) 2 % Apply 1 application topically 2 (two) times daily. Patient not taking: Reported on 08/15/2021 07/10/21   Mayers, Cari S, PA-C  ondansetron  (ZOFRAN -ODT) 4 MG disintegrating tablet  Take 1 tablet (4 mg total) by mouth every 4 (four) hours as needed for nausea or vomiting. 11/29/23   Cheyenne Cotta, MD  triamcinolone  cream (KENALOG ) 0.1 % Apply 1 application topically 2 (two) times daily. Patient not taking: Reported on 08/15/2021 07/10/21   Mayers, Cari S, PA-C      Allergies    Mustard, Prozac [fluoxetine hcl], and Wellbutrin [bupropion]    Review of Systems   Review of Systems  Physical Exam Updated Vital Signs BP (!) 152/119 (BP Location: Right Arm)   Pulse (!) 111   Temp 98.1 F (36.7 C) (Oral)   Resp 18   SpO2 99%  Physical Exam Vitals and nursing note reviewed.  Constitutional:      General: He is not in acute distress.    Appearance: He is well-developed. He is not diaphoretic.  HENT:     Head: Normocephalic and atraumatic.  Eyes:     General: No scleral icterus.    Conjunctiva/sclera: Conjunctivae normal.  Cardiovascular:     Rate and Rhythm: Normal rate and regular rhythm.     Heart sounds: Normal heart sounds.  Pulmonary:     Effort: Pulmonary effort is normal. No respiratory distress.     Breath sounds: Normal breath sounds.  Abdominal:     Palpations: Abdomen is soft.     Tenderness: There is no abdominal tenderness.  Musculoskeletal:  Cervical back: Normal range of motion and neck supple.  Skin:    General: Skin is warm and dry.  Neurological:     Mental Status: He is alert.  Psychiatric:        Mood and Affect: Mood is anxious.        Speech: Speech is tangential.        Behavior: Behavior is hyperactive.     ED Results / Procedures / Treatments   Labs (all labs ordered are listed, but only abnormal results are displayed) Labs Reviewed - No data to display  EKG None  Radiology No results found.  Procedures Procedures    Medications Ordered in ED Medications - No data to display  ED Course/ Medical Decision Making/ A&P                                 Medical Decision Making  Patient erroneously checked into  the emergency department, no complaints appropriate for discharge        Final Clinical Impression(s) / ED Diagnoses Final diagnoses:  None    Rx / DC Orders ED Discharge Orders     None         Tama Fails, PA-C 11/30/23 1258    Albertus Hughs, DO 11/30/23 1310

## 2023-12-01 NOTE — ED Provider Notes (Signed)
 [place] EMERGENCY DEPARTMENT AT [facility] Provider Note   CSN: [id number] Arrival date & time: 11/29/23  1507     History  Chief Complaint  Patient presents with   Weakness    David Andrews is a 33 y.o. male.  Patient has a history of drug abuse.  Recently took some fentanyl.  He just complains of being weak and anxious.  The history is provided by the patient and medical records. No language interpreter was used.  Weakness Severity:  Moderate Onset quality:  Sudden Timing:  Constant Progression:  Worsening Chronicity:  Recurrent Context: dehydration   Relieved by:  Nothing Worsened by:  Nothing Ineffective treatments:  None tried Associated symptoms: no abdominal pain, no chest pain, no cough, no diarrhea, no frequency, no headaches and no seizures        Home Medications Prior to Admission medications   Medication Sig Start Date End Date Taking? Authorizing Provider  hydrOXYzine  (ATARAX ) 25 MG tablet Take 1 tablet (25 mg total) by mouth every 8 (eight) to 12 (twelve) hours as needed for anxiety. 11/29/23  Yes Elisa Kutner, MD  ondansetron  (ZOFRAN -ODT) 4 MG disintegrating tablet Take 1 tablet (4 mg total) by mouth every 4 (four) hours as needed for nausea or vomiting. 11/29/23  Yes Kaydin Labo, MD  ARIPiprazole  (ABILIFY ) 5 MG tablet Take 1 tablet (5 mg total) by mouth daily. 08/07/21   Mayers, Cari S, PA-C  gabapentin  (NEURONTIN ) 300 MG capsule Take 1 capsule (300 mg total) by mouth 2 (two) times daily. 08/07/21 09/06/21  Mayers, Cari S, PA-C  Glecaprevir -Pibrentasvir  (MAVYRET ) 100-40 MG TABS Take 3 tablets by mouth daily with breakfast. Patient not taking: Reported on 08/15/2021 08/04/21   Sonya Duster, RPH-CPP  mirtazapine  (REMERON ) 15 MG tablet Take 1 tablet (15 mg total) by mouth at bedtime. 08/07/21   Mayers, Cari S, PA-C  mupirocin  cream (BACTROBAN ) 2 % Apply 1 application topically 2 (two) times daily. Patient not taking: Reported on 08/15/2021 07/10/21    Mayers, Cari S, PA-C  triamcinolone  cream (KENALOG ) 0.1 % Apply 1 application topically 2 (two) times daily. Patient not taking: Reported on 08/15/2021 07/10/21   Mayers, Etter Hermann, PA-C      Allergies    Mustard, Prozac [fluoxetine hcl], and Wellbutrin [bupropion]    Review of Systems   Review of Systems  Constitutional:  Negative for appetite change and fatigue.  HENT:  Negative for congestion, ear discharge and sinus pressure.   Eyes:  Negative for discharge.  Respiratory:  Negative for cough.   Cardiovascular:  Negative for chest pain.  Gastrointestinal:  Negative for abdominal pain and diarrhea.  Genitourinary:  Negative for frequency and hematuria.  Musculoskeletal:  Negative for back pain.  Skin:  Negative for rash.  Neurological:  Positive for weakness. Negative for seizures and headaches.  Psychiatric/Behavioral:  Negative for hallucinations.     Physical Exam Updated Vital Signs BP (!) 158/93 (BP Location: Right Arm)   Pulse 80   Temp 98.2 F (36.8 C) (Oral)   Resp 14   Ht 5\' 11"  (1.803 m)   Wt 92.5 kg   SpO2 100%   BMI 28.44 kg/m  Physical Exam Vitals and nursing note reviewed.  Constitutional:      Appearance: He is well-developed.  HENT:     Head: Normocephalic.     Nose: Nose normal.     Mouth/Throat:     Comments: Dry mucous membranes Eyes:     General: No scleral icterus.  Conjunctiva/sclera: Conjunctivae normal.  Neck:     Thyroid: No thyromegaly.  Cardiovascular:     Rate and Rhythm: Normal rate and regular rhythm.     Heart sounds: No murmur heard.    No friction rub. No gallop.  Pulmonary:     Breath sounds: No stridor. No wheezing or rales.  Chest:     Chest wall: No tenderness.  Abdominal:     General: There is no distension.     Tenderness: There is no abdominal tenderness. There is no rebound.  Musculoskeletal:        General: Normal range of motion.     Cervical back: Neck supple.  Lymphadenopathy:     Cervical: No cervical  adenopathy.  Skin:    Findings: No erythema or rash.  Neurological:     Mental Status: He is alert and oriented to person, place, and time.     Motor: No abnormal muscle tone.     Coordination: Coordination normal.  Psychiatric:        Behavior: Behavior normal.     ED Results / Procedures / Treatments   Labs (all labs ordered are listed, but only abnormal results are displayed) Labs Reviewed  URINALYSIS, ROUTINE W REFLEX MICROSCOPIC - Abnormal; Notable for the following components:      Result Value   Color, Urine AMBER (*)    Protein, ur 30 (*)    All other components within normal limits  BASIC METABOLIC PANEL WITH GFR - Abnormal; Notable for the following components:   Sodium 133 (*)    Potassium 3.1 (*)    Chloride 93 (*)    All other components within normal limits  CBC    EKG None  Radiology No results found.  Procedures Procedures    Medications Ordered in ED Medications  sodium chloride  0.9 % bolus 1,000 mL (0 mLs Intravenous Stopped 11/29/23 2114)  ondansetron  (ZOFRAN ) injection 4 mg (4 mg Intravenous Given 11/29/23 1819)  pantoprazole  (PROTONIX ) injection 40 mg (40 mg Intravenous Given 11/29/23 1818)  LORazepam  (ATIVAN ) injection 1 mg (1 mg Intravenous Given 11/29/23 1819)    ED Course/ Medical Decision Making/ A&P                                 Medical Decision Making Amount and/or Complexity of Data Reviewed Labs: ordered.  Risk Prescription drug management.   Patient dehydrated, substance abuse and homeless.  Patient refusing IV fluids and will be discharged home with some Vistaril  for anxiety.  He was given something to eat and drink        Final Clinical Impression(s) / ED Diagnoses Final diagnoses:  Dehydration  Anxiety    Rx / DC Orders ED Discharge Orders          Ordered    ondansetron  (ZOFRAN -ODT) 4 MG disintegrating tablet  Every 4 hours PRN        11/29/23 2053    hydrOXYzine  (ATARAX ) 25 MG tablet  Every 8 hours PRN         11/29/23 2053              Keyatta Tolles, MD 12/01/23 1048

## 2023-12-27 ENCOUNTER — Other Ambulatory Visit: Payer: Self-pay

## 2023-12-27 ENCOUNTER — Emergency Department (HOSPITAL_COMMUNITY)
Admission: EM | Admit: 2023-12-27 | Discharge: 2023-12-27 | Disposition: A | Discharge status: Home / Self Care | Payer: MEDICAID

## 2023-12-27 ENCOUNTER — Emergency Department (HOSPITAL_COMMUNITY): Payer: MEDICAID

## 2023-12-27 ENCOUNTER — Other Ambulatory Visit (HOSPITAL_COMMUNITY): Payer: Self-pay

## 2023-12-27 ENCOUNTER — Encounter (HOSPITAL_COMMUNITY): Payer: Self-pay

## 2023-12-27 DIAGNOSIS — Y92481 Parking lot as the place of occurrence of the external cause: Secondary | ICD-10-CM | POA: Diagnosis not present

## 2023-12-27 DIAGNOSIS — S40012A Contusion of left shoulder, initial encounter: Secondary | ICD-10-CM | POA: Insufficient documentation

## 2023-12-27 DIAGNOSIS — M25511 Pain in right shoulder: Secondary | ICD-10-CM | POA: Diagnosis present

## 2023-12-27 DIAGNOSIS — S8001XA Contusion of right knee, initial encounter: Secondary | ICD-10-CM | POA: Diagnosis not present

## 2023-12-27 LAB — CBC
HCT: 40.2 % (ref 39.0–52.0)
Hemoglobin: 13.4 g/dL (ref 13.0–17.0)
MCH: 29.9 pg (ref 26.0–34.0)
MCHC: 33.3 g/dL (ref 30.0–36.0)
MCV: 89.7 fL (ref 80.0–100.0)
Platelets: 332 10*3/uL (ref 150–400)
RBC: 4.48 MIL/uL (ref 4.22–5.81)
RDW: 12.8 % (ref 11.5–15.5)
WBC: 6.8 10*3/uL (ref 4.0–10.5)
nRBC: 0 % (ref 0.0–0.2)

## 2023-12-27 LAB — BASIC METABOLIC PANEL WITH GFR
Anion gap: 13 (ref 5–15)
BUN: 8 mg/dL (ref 6–20)
CO2: 22 mmol/L (ref 22–32)
Calcium: 8.8 mg/dL — ABNORMAL LOW (ref 8.9–10.3)
Chloride: 102 mmol/L (ref 98–111)
Creatinine, Ser: 0.78 mg/dL (ref 0.61–1.24)
GFR, Estimated: >60 mL/min (ref >60)
Glucose, Bld: 90 mg/dL (ref 70–99)
Potassium: 3.7 mmol/L (ref 3.5–5.1)
Sodium: 137 mmol/L (ref 135–145)

## 2023-12-27 MED ORDER — NAPROXEN 500 MG PO TABS
500.0000 mg | ORAL_TABLET | Freq: Two times a day (BID) | ORAL | 0 refills | Status: DC
  Filled 2023-12-27: qty 30, 15d supply, fill #0

## 2023-12-27 MED ORDER — KETOROLAC TROMETHAMINE 15 MG/ML IJ SOLN
15.0000 mg | Freq: Once | INTRAMUSCULAR | Status: AC
  Administered 2023-12-27: 15 mg via INTRAVENOUS
  Filled 2023-12-27: qty 1

## 2023-12-27 MED ORDER — TETANUS-DIPHTH-ACELL PERTUSSIS 5-2.5-18.5 LF-MCG/0.5 IM SUSY
0.5000 mL | PREFILLED_SYRINGE | Freq: Once | INTRAMUSCULAR | Status: DC
  Filled 2023-12-27: qty 0.5

## 2023-12-27 NOTE — ED Notes (Signed)
 CCMD contacted to admit the patient for cardiac monitoring.

## 2023-12-27 NOTE — Discharge Instructions (Signed)
 Your workup including your EKG and blood work as well as the CT scans and x-rays did not show any concerning findings.  Please take the naproxen  as needed for pain and follow-up with your doctor.  Return to the ER for worsening symptoms.
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
## 2023-12-27 NOTE — ED Notes (Signed)
 This RN reviewed discharge instructions with patient. he verbalized understanding and denied any further questions. PT well appearing upon discharge and reports tolerable pain. Provided patient with food and drink
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
## 2024-01-06 ENCOUNTER — Other Ambulatory Visit (HOSPITAL_COMMUNITY): Payer: Self-pay

## 2024-05-09 ENCOUNTER — Ambulatory Visit (HOSPITAL_COMMUNITY)
Admission: EM | Admit: 2024-05-09 | Discharge: 2024-05-09 | Disposition: A | Discharge status: Home / Self Care | Payer: Self-pay

## 2024-05-09 NOTE — Progress Notes (Signed)
 Per triage, pt did not want to be seen and left facility AMA. Pt id discharged at this time.

## 2024-05-10 ENCOUNTER — Encounter (HOSPITAL_COMMUNITY): Payer: Self-pay

## 2024-05-10 ENCOUNTER — Emergency Department (HOSPITAL_COMMUNITY)
Admission: EM | Admit: 2024-05-10 | Discharge: 2024-05-11 | Disposition: A | Discharge status: Home / Self Care | Payer: Self-pay | Attending: Emergency Medicine | Admitting: Emergency Medicine

## 2024-05-10 ENCOUNTER — Other Ambulatory Visit: Payer: Self-pay

## 2024-05-10 ENCOUNTER — Emergency Department (HOSPITAL_COMMUNITY)
Admission: EM | Admit: 2024-05-10 | Discharge: 2024-05-10 | Discharge status: Against Medical Advice | Payer: MEDICAID | Attending: Emergency Medicine | Admitting: Emergency Medicine

## 2024-05-10 DIAGNOSIS — Z5321 Procedure and treatment not carried out due to patient leaving prior to being seen by health care provider: Secondary | ICD-10-CM | POA: Diagnosis not present

## 2024-05-10 DIAGNOSIS — Z5900 Homelessness unspecified: Secondary | ICD-10-CM | POA: Insufficient documentation

## 2024-05-10 DIAGNOSIS — M791 Myalgia, unspecified site: Secondary | ICD-10-CM | POA: Insufficient documentation

## 2024-05-10 DIAGNOSIS — F1910 Other psychoactive substance abuse, uncomplicated: Secondary | ICD-10-CM | POA: Diagnosis present

## 2024-05-10 LAB — COMPREHENSIVE METABOLIC PANEL WITH GFR
ALT: 46 U/L — ABNORMAL HIGH (ref 0–44)
AST: 33 U/L (ref 15–41)
Albumin: 3.5 g/dL (ref 3.5–5.0)
Alkaline Phosphatase: 81 U/L (ref 38–126)
Anion gap: 12 (ref 5–15)
BUN: 8 mg/dL (ref 6–20)
CO2: 23 mmol/L (ref 22–32)
Calcium: 9.3 mg/dL (ref 8.9–10.3)
Chloride: 101 mmol/L (ref 98–111)
Creatinine, Ser: 0.76 mg/dL (ref 0.61–1.24)
GFR, Estimated: >60 mL/min (ref >60)
Glucose, Bld: 76 mg/dL (ref 70–99)
Potassium: 3.5 mmol/L (ref 3.5–5.1)
Sodium: 136 mmol/L (ref 135–145)
Total Bilirubin: 0.3 mg/dL (ref 0.0–1.2)
Total Protein: 6.8 g/dL (ref 6.5–8.1)

## 2024-05-10 LAB — CBC
HCT: 40.7 % (ref 39.0–52.0)
Hemoglobin: 13.5 g/dL (ref 13.0–17.0)
MCH: 28.5 pg (ref 26.0–34.0)
MCHC: 33.2 g/dL (ref 30.0–36.0)
MCV: 85.9 fL (ref 80.0–100.0)
Platelets: 352 K/uL (ref 150–400)
RBC: 4.74 MIL/uL (ref 4.22–5.81)
RDW: 12.9 % (ref 11.5–15.5)
WBC: 8 K/uL (ref 4.0–10.5)
nRBC: 0 % (ref 0.0–0.2)

## 2024-05-10 LAB — RAPID URINE DRUG SCREEN, HOSP PERFORMED
Amphetamines: POSITIVE — AB
Barbiturates: NOT DETECTED
Benzodiazepines: NOT DETECTED
Cocaine: POSITIVE — AB
Opiates: NOT DETECTED
Tetrahydrocannabinol: NOT DETECTED

## 2024-05-10 LAB — ETHANOL: Alcohol, Ethyl (B): <15 mg/dL (ref <15)

## 2024-05-10 MED ORDER — ONDANSETRON 4 MG PO TBDP
4.0000 mg | ORAL_TABLET | Freq: Once | ORAL | Status: DC
  Filled 2024-05-10: qty 1

## 2024-05-10 MED ORDER — ONDANSETRON 4 MG PO TBDP
4.0000 mg | ORAL_TABLET | Freq: Once | ORAL | Status: AC
  Administered 2024-05-10: 4 mg via ORAL

## 2024-05-10 MED ORDER — LORAZEPAM 1 MG PO TABS
1.0000 mg | ORAL_TABLET | Freq: Once | ORAL | Status: AC
  Administered 2024-05-10: 1 mg via ORAL
  Filled 2024-05-10: qty 1

## 2024-05-10 NOTE — ED Provider Notes (Signed)
 [place] EMERGENCY DEPARTMENT AT [facility] Provider Note   CSN: [id number] Arrival date & time: 05/10/24  2002     Patient presents with: Drug Problem and Medical Clearance   David Andrews is a 33 y.o. male with known polysubstance use and homelessness who presents with request for detox.  States that he was told he had to be medically cleared in the emergency department before he could be transferred to behavioral health for detox.  States that he last used fentanyl a few hours ago and Xanax approximately a day and a half ago.  He is anxious, nauseated and endorses generalized body pain.  Also has history of chronic hep C.   HPI     Prior to Admission medications   Medication Sig Start Date End Date Taking? Authorizing Provider  hydrOXYzine  (ATARAX ) 25 MG tablet Take 1 tablet (25 mg total) by mouth every 8 (eight) to 12 (twelve) hours as needed for anxiety. Patient not taking: Reported on 12/27/2023 11/29/23   Zammit, Joseph, MD  naproxen  (NAPROSYN ) 500 MG tablet Take 1 tablet (500 mg total) by mouth 2 (two) times daily. 12/27/23   Ula Prentice SAUNDERS, MD  ondansetron  (ZOFRAN -ODT) 4 MG disintegrating tablet Take 1 tablet (4 mg total) by mouth every 4 (four) hours as needed for nausea or vomiting. Patient not taking: Reported on 12/27/2023 11/29/23   Zammit, Joseph, MD    Allergies: Mustard and Wellbutrin [bupropion]    Review of Systems  Gastrointestinal:  Positive for nausea.  Musculoskeletal:  Positive for myalgias.  Psychiatric/Behavioral:  The patient is nervous/anxious.     Updated Vital Signs BP (!) 134/99 (BP Location: Right Arm)   Pulse (!) 110   Temp 98.7 F (37.1 C)   Resp 18   SpO2 100%   Physical Exam Vitals and nursing note reviewed.  Constitutional:      Appearance: He is not ill-appearing or toxic-appearing.  HENT:     Head: Normocephalic and atraumatic.     Mouth/Throat:     Mouth: Mucous membranes are moist.     Pharynx: No oropharyngeal  exudate or posterior oropharyngeal erythema.  Eyes:     General:        Right eye: No discharge.        Left eye: No discharge.     Extraocular Movements: Extraocular movements intact.     Conjunctiva/sclera: Conjunctivae normal.     Pupils: Pupils are equal, round, and reactive to light.  Cardiovascular:     Rate and Rhythm: Normal rate and regular rhythm.     Pulses: Normal pulses.  Pulmonary:     Effort: Pulmonary effort is normal. No respiratory distress.     Breath sounds: Normal breath sounds. No wheezing or rales.  Abdominal:     General: Bowel sounds are normal. There is no distension.     Palpations: Abdomen is soft.     Tenderness: There is no abdominal tenderness.  Musculoskeletal:        General: No deformity.     Cervical back: Neck supple.  Skin:    General: Skin is warm and dry.     Capillary Refill: Capillary refill takes less than 2 seconds.  Neurological:     General: No focal deficit present.     Mental Status: He is alert. Mental status is at baseline.  Psychiatric:        Mood and Affect: Mood is anxious.        Behavior: Behavior is hyperactive.  Behavior is cooperative.        Thought Content: Thought content does not include homicidal or suicidal ideation.     Comments: Does not appear to be responding to internal stimuli.     (all labs ordered are listed, but only abnormal results are displayed) Labs Reviewed  COMPREHENSIVE METABOLIC PANEL WITH GFR - Abnormal; Notable for the following components:      Result Value   ALT 46 (*)    All other components within normal limits  RAPID URINE DRUG SCREEN, HOSP PERFORMED - Abnormal; Notable for the following components:   Cocaine POSITIVE (*)    Amphetamines POSITIVE (*)    All other components within normal limits  ETHANOL  CBC    EKG: EKG Interpretation Date/Time:  Sunday May 10 2024 22:36:03 EST Ventricular Rate:  97 PR Interval:  124 QRS Duration:  94 QT Interval:  378 QTC  Calculation: 480 R Axis:   86  Text Interpretation: Normal sinus rhythm Prolonged QT Abnormal ECG Confirmed by Francesca Fallow ([id number]) on 05/10/2024 10:48:41 PM  Radiology: No results found.   Procedures   Medications Ordered in the ED  LORazepam  (ATIVAN ) tablet 1 mg (1 mg Oral Given 05/10/24 2256)  ondansetron  (ZOFRAN -ODT) disintegrating tablet 4 mg (4 mg Oral Given 05/10/24 2300)    Clinical Course as of 05/10/24 2313  Sun May 10, 2024  2307 Case discussed with [facility] NP David Andrews who is amenable to excepting this patient in transfer to her facility for polysubstance use detox.  Will transfer the patient via safe transport.  I appreciate her cooperation in the care of this patient. [RS]    Clinical Course User Index [RS] Vinette Crites, Pleasant SAUNDERS, PA-C                                 Medical Decision Making 33 year old male presents with request for medical clearance so he can go to behavioral for detox. . Tachycardic and mildly hypertensive on intake, vital signs normal.  Cardiopulmonary exam unremarkable with resolution of tachycardia at time of my evaluation. Abdominal exam is benign.  No SI HI or AVH at this time, does not appear to responding to internal stimuli.  Amount and/or Complexity of Data Reviewed Labs: ordered.    Details: CBC CMP alcohol all reassuring.  UDS positive for cocaine and amphetamines.  Risk Prescription drug management.   Patient is medically cleared at this time for detox with behavioral health.  Case discussed with the behavioral health provider as above and he has been accepted in transfer to the [facility].  Will plan to transfer the patient via safe transport given his homeless status and lack of transportation.  David Andrews voiced understanding of his medical evaluation and treatment plan. Each of their questions answered to their expressed satisfaction.  Return precautions were given.  Patient is well-appearing, stable, and  was discharged in good condition.  This chart was dictated using voice recognition software, Dragon. Despite the best efforts of this provider to proofread and correct errors, errors may still occur which can change documentation meaning.       Final diagnoses:  Polysubstance abuse [facility])    ED Discharge Orders     None          Bobette Pleasant SAUNDERS DEVONNA 05/10/24 2314    Francesca Fallow CROME, MD 05/13/24 1527

## 2024-05-10 NOTE — ED Triage Notes (Signed)
 Pt states he was referred to ED from [facility] [address] for detox. He states that he is requesting suboxone for detox from fentanyl, xanax, and meth.
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
## 2024-05-10 NOTE — ED Triage Notes (Signed)
 Patient here for medical clearance to go to [facility] for detox. Patient aware of process and agreeable at this.

## 2024-05-10 NOTE — ED Triage Notes (Signed)
 Patient states that [address] does not prescribe suboxone and told him he needs a prescription before he arrives but once he has one they can take him. Patient states that [place] branch does prescribe but he is unable to get to that location at this time. Patient is tearful and anxious in triage, states that he wants assistance with detox before he has to [facility].

## 2024-05-10 NOTE — ED Notes (Signed)
 Patient unable to stay in ED at this time, states he is starting to feel withdrawal symptoms and needs to leave. Patient ambulated to lobby with steady gait, did not appear in distress at time of departure. Patient advised to return to the ED at any point for care if needed. Patient verbalized understanding.

## 2024-05-11 ENCOUNTER — Ambulatory Visit (HOSPITAL_COMMUNITY): Admission: EM | Admit: 2024-05-11 | Discharge: 2024-05-12 | Disposition: A | Discharge status: Home / Self Care

## 2024-05-11 DIAGNOSIS — Z8619 Personal history of other infectious and parasitic diseases: Secondary | ICD-10-CM | POA: Insufficient documentation

## 2024-05-11 DIAGNOSIS — F1120 Opioid dependence, uncomplicated: Secondary | ICD-10-CM | POA: Diagnosis not present

## 2024-05-11 DIAGNOSIS — F1590 Other stimulant use, unspecified, uncomplicated: Secondary | ICD-10-CM | POA: Insufficient documentation

## 2024-05-11 DIAGNOSIS — F1310 Sedative, hypnotic or anxiolytic abuse, uncomplicated: Secondary | ICD-10-CM | POA: Insufficient documentation

## 2024-05-11 DIAGNOSIS — Z5900 Homelessness unspecified: Secondary | ICD-10-CM | POA: Insufficient documentation

## 2024-05-11 DIAGNOSIS — Z79899 Other long term (current) drug therapy: Secondary | ICD-10-CM | POA: Insufficient documentation

## 2024-05-11 DIAGNOSIS — F1110 Opioid abuse, uncomplicated: Secondary | ICD-10-CM

## 2024-05-11 DIAGNOSIS — F319 Bipolar disorder, unspecified: Secondary | ICD-10-CM | POA: Insufficient documentation

## 2024-05-11 DIAGNOSIS — F1090 Alcohol use, unspecified, uncomplicated: Secondary | ICD-10-CM | POA: Insufficient documentation

## 2024-05-11 DIAGNOSIS — M159 Polyosteoarthritis, unspecified: Secondary | ICD-10-CM | POA: Insufficient documentation

## 2024-05-11 DIAGNOSIS — F1920 Other psychoactive substance dependence, uncomplicated: Secondary | ICD-10-CM

## 2024-05-11 DIAGNOSIS — B182 Chronic viral hepatitis C: Secondary | ICD-10-CM

## 2024-05-11 DIAGNOSIS — F411 Generalized anxiety disorder: Secondary | ICD-10-CM | POA: Insufficient documentation

## 2024-05-11 LAB — POCT URINE DRUG SCREEN - MANUAL ENTRY (I-SCREEN)
POC Amphetamine UR: POSITIVE — AB
POC Buprenorphine (BUP): POSITIVE — AB
POC Cocaine UR: POSITIVE — AB
POC Marijuana UR: POSITIVE — AB
POC Methadone UR: NOT DETECTED
POC Methamphetamine UR: POSITIVE — AB
POC Morphine: NOT DETECTED
POC Oxazepam (BZO): POSITIVE — AB
POC Oxycodone UR: NOT DETECTED
POC Secobarbital (BAR): NOT DETECTED

## 2024-05-11 MED ORDER — LORAZEPAM 2 MG/ML IJ SOLN: 2.0000 mg | Freq: Three times a day (TID) | INTRAMUSCULAR | Status: DC | PRN

## 2024-05-11 MED ORDER — DIPHENHYDRAMINE HCL 50 MG PO CAPS
50.0000 mg | ORAL_CAPSULE | Freq: Three times a day (TID) | ORAL | Status: DC | PRN
  Administered 2024-05-11: 50 mg via ORAL

## 2024-05-11 MED ORDER — CHLORDIAZEPOXIDE HCL 25 MG PO CAPS: 25.0000 mg | ORAL_CAPSULE | ORAL | Status: DC

## 2024-05-11 MED ORDER — TRAZODONE HCL 50 MG PO TABS
50.0000 mg | ORAL_TABLET | Freq: Every evening | ORAL | Status: DC | PRN
  Administered 2024-05-11: 50 mg via ORAL
  Filled 2024-05-11: qty 1

## 2024-05-11 MED ORDER — HALOPERIDOL 5 MG PO TABS
5.0000 mg | ORAL_TABLET | Freq: Three times a day (TID) | ORAL | Status: DC | PRN
  Administered 2024-05-11: 5 mg via ORAL
  Filled 2024-05-11: qty 1

## 2024-05-11 MED ORDER — CLONIDINE HCL 0.1 MG PO TABS: 0.1000 mg | ORAL_TABLET | Freq: Every day | ORAL | Status: DC

## 2024-05-11 MED ORDER — CHLORDIAZEPOXIDE HCL 25 MG PO CAPS
25.0000 mg | ORAL_CAPSULE | Freq: Four times a day (QID) | ORAL | Status: DC
  Administered 2024-05-11 – 2024-05-12 (×5): 25 mg via ORAL
  Filled 2024-05-11 (×5): qty 1

## 2024-05-11 MED ORDER — ACETAMINOPHEN 325 MG PO TABS: 650.0000 mg | ORAL_TABLET | Freq: Four times a day (QID) | ORAL | Status: DC | PRN

## 2024-05-11 MED ORDER — CHLORDIAZEPOXIDE HCL 25 MG PO CAPS: 25.0000 mg | ORAL_CAPSULE | Freq: Every day | ORAL | Status: DC

## 2024-05-11 MED ORDER — DIPHENHYDRAMINE HCL 50 MG/ML IJ SOLN: 50.0000 mg | Freq: Three times a day (TID) | INTRAMUSCULAR | Status: DC | PRN

## 2024-05-11 MED ORDER — DIPHENHYDRAMINE HCL 50 MG PO CAPS
50.0000 mg | ORAL_CAPSULE | Freq: Four times a day (QID) | ORAL | Status: DC | PRN
  Filled 2024-05-11: qty 1

## 2024-05-11 MED ORDER — HALOPERIDOL LACTATE 5 MG/ML IJ SOLN: 5.0000 mg | Freq: Three times a day (TID) | INTRAMUSCULAR | Status: DC | PRN

## 2024-05-11 MED ORDER — CLONIDINE HCL 0.1 MG PO TABS: 0.1000 mg | ORAL_TABLET | ORAL | Status: DC

## 2024-05-11 MED ORDER — CLONIDINE HCL 0.1 MG PO TABS
0.1000 mg | ORAL_TABLET | Freq: Four times a day (QID) | ORAL | Status: DC
  Administered 2024-05-11 – 2024-05-12 (×5): 0.1 mg via ORAL
  Filled 2024-05-11 (×5): qty 1

## 2024-05-11 MED ORDER — IBUPROFEN 400 MG PO TABS
600.0000 mg | ORAL_TABLET | Freq: Once | ORAL | Status: AC
  Administered 2024-05-11: 600 mg via ORAL
  Filled 2024-05-11: qty 1

## 2024-05-11 MED ORDER — ONDANSETRON 4 MG PO TBDP
4.0000 mg | ORAL_TABLET | Freq: Four times a day (QID) | ORAL | Status: DC | PRN
  Administered 2024-05-11: 4 mg via ORAL
  Filled 2024-05-11: qty 1

## 2024-05-11 MED ORDER — METHOCARBAMOL 500 MG PO TABS
500.0000 mg | ORAL_TABLET | Freq: Three times a day (TID) | ORAL | Status: DC | PRN
  Administered 2024-05-11: 500 mg via ORAL
  Filled 2024-05-11: qty 1

## 2024-05-11 MED ORDER — ACETAMINOPHEN 500 MG PO TABS
1000.0000 mg | ORAL_TABLET | Freq: Once | ORAL | Status: AC
  Administered 2024-05-11: 1000 mg via ORAL
  Filled 2024-05-11: qty 2

## 2024-05-11 MED ORDER — NICOTINE 21 MG/24HR TD PT24
21.0000 mg | MEDICATED_PATCH | Freq: Every day | TRANSDERMAL | Status: DC
  Filled 2024-05-11: qty 1

## 2024-05-11 MED ORDER — DICYCLOMINE HCL 20 MG PO TABS
20.0000 mg | ORAL_TABLET | Freq: Four times a day (QID) | ORAL | Status: DC | PRN
  Administered 2024-05-11: 20 mg via ORAL
  Filled 2024-05-11: qty 1

## 2024-05-11 MED ORDER — CHLORDIAZEPOXIDE HCL 25 MG PO CAPS: 25.0000 mg | ORAL_CAPSULE | Freq: Three times a day (TID) | ORAL | Status: DC

## 2024-05-11 MED ORDER — THIAMINE HCL 100 MG/ML IJ SOLN
100.0000 mg | Freq: Once | INTRAMUSCULAR | Status: AC
  Administered 2024-05-11: 100 mg via INTRAMUSCULAR
  Filled 2024-05-11: qty 2

## 2024-05-11 MED ORDER — ADULT MULTIVITAMIN W/MINERALS CH
1.0000 | ORAL_TABLET | Freq: Every day | ORAL | Status: DC
  Administered 2024-05-11 – 2024-05-12 (×2): 1 via ORAL
  Filled 2024-05-11 (×2): qty 1

## 2024-05-11 MED ORDER — IBUPROFEN 200 MG PO TABS
ORAL_TABLET | ORAL | Status: AC
  Filled 2024-05-11: qty 1

## 2024-05-11 MED ORDER — ALUM & MAG HYDROXIDE-SIMETH 200-200-20 MG/5ML PO SUSP: 30.0000 mL | ORAL | Status: DC | PRN

## 2024-05-11 MED ORDER — NAPROXEN 500 MG PO TABS: 500.0000 mg | ORAL_TABLET | Freq: Two times a day (BID) | ORAL | Status: DC | PRN

## 2024-05-11 MED ORDER — CHLORDIAZEPOXIDE HCL 25 MG PO CAPS: 25.0000 mg | ORAL_CAPSULE | Freq: Four times a day (QID) | ORAL | Status: DC | PRN

## 2024-05-11 MED ORDER — HYDROXYZINE HCL 25 MG PO TABS
25.0000 mg | ORAL_TABLET | Freq: Four times a day (QID) | ORAL | Status: DC | PRN
  Administered 2024-05-11: 25 mg via ORAL
  Filled 2024-05-11: qty 1

## 2024-05-11 MED ORDER — HALOPERIDOL LACTATE 5 MG/ML IJ SOLN: 10.0000 mg | Freq: Three times a day (TID) | INTRAMUSCULAR | Status: DC | PRN

## 2024-05-11 MED ORDER — MAGNESIUM HYDROXIDE 400 MG/5ML PO SUSP: 30.0000 mL | Freq: Every day | ORAL | Status: DC | PRN

## 2024-05-11 MED ORDER — LOPERAMIDE HCL 2 MG PO CAPS: 2.0000 mg | ORAL_CAPSULE | ORAL | Status: DC | PRN

## 2024-05-11 NOTE — ED Notes (Signed)
 Pt resting in bed, no acute distress noted. Respirations even and unlabored. Continue to monitor for safety.

## 2024-05-11 NOTE — ED Notes (Signed)
 Per [facility], pt cannot be transferred until the morning. Pleasant Eng, PA notified.

## 2024-05-11 NOTE — ED Notes (Signed)
 Discharge paperwork given to pt, pt from department via safe transport to [facility] as a voluntary walk in.

## 2024-05-11 NOTE — ED Notes (Signed)
 Attempted to call [facility], no answer, will call back.

## 2024-05-11 NOTE — ED Notes (Signed)
Pt in bed with eyes closed, resps even and unlabored.  

## 2024-05-11 NOTE — ED Notes (Signed)
 Attempted to call report to [facility] at [phone], talked with a RN and told that pt is not a direct admit and would be a walk in.  States that report doesn't need to be given because he has not been admitted and will be seen as a walk in.

## 2024-05-11 NOTE — BH Assessment (Signed)
 Comprehensive Clinical Assessment (CCA) Note  05/11/2024 David Andrews [id number]  DISPOSITION: Per Gaylan Mccoy NP Pt is recommended for detox in [facility]  The patient demonstrates the following risk factors for suicide: Chronic risk factors for suicide include: psychiatric disorder of Bipolar d/o and substance use disorder. Acute risk factors for suicide include: unemployment and social withdrawal/isolation. Protective factors for this patient include: hope for the future. Considering these factors, the overall suicide risk at this point appears to be low. Patient is appropriate for outpatient follow up.   Per Triage assessment: "Pt presents to [facility] voluntarily via ST from [facility]. Pt is actively withdrawing from meth and xanax. Pt is seeking detox. Pt denies SI, HI, AVH"  With further assessment: Pt is a 33 yo male who presented requesting detox from fentanyl (IV), methamphetamine (IV) and Xanax. Pt denied SI, HI, NSSH, AVH and paranoia. Pt reported daily use of fentanyl, regular use of Xanax and weekly use of methamphetamine. Pt has been diagnosed with Bipolar d/o in the past but does not take any medications. Pt has no OP psychiatric providers currently.   Pt is currently homeless and is unemployed. Pt stated that his family is not supportive. Pt denied access to firearms. Pt stated that he has an upcoming court date in December on [place] charges. Pt stated that he sleeps about 1-2 hours at night and his appetite is normal. Pt stated that he feels hopeless, helpless, worthless and irritable most of the time. Hx of alcohol abuse and withdrawal seizures from xanax.   Chief Complaint: Detox  Visit Diagnosis:  Opioid Use d/o, Severe Stimulant Use d/o, Moderate    CCA Screening, Triage and Referral (STR)  Patient Reported Information How did you hear about us ? Hospital Discharge  What Is the Reason for Your Visit/Call Today? Pt presents to [facility] voluntarily via ST from [facility]. Pt is  actively withdrawing from meth and xanax. Pt is seeking detox. Pt denies SI, HI, AVH.  How Long Has This Been Causing You Problems? <Week  What Do You Feel Would Help You the Most Today? Alcohol or Drug Use Treatment   Have You Recently Had Any Thoughts About Hurting Yourself? No  Are You Planning to Commit Suicide/Harm Yourself At This time? No   Flowsheet Row ED from 05/10/2024 in [facility] Emergency Department at [facility] Most recent reading at 05/10/2024  9:25 PM ED from 05/10/2024 in [facility] Emergency Department at [facility] Most recent reading at 05/10/2024  5:07 PM ED from 11/30/2023 in [facility] Emergency Department at [facility] Most recent reading at 11/30/2023 11:19 AM  C-SSRS RISK CATEGORY No Risk No Risk No Risk    Have you Recently Had Thoughts About Hurting Someone David Andrews? No  Are You Planning to Harm Someone at This Time? No  Explanation: na  Have You Used Any Alcohol or Drugs in the Past 24 Hours? No  How Long Ago Did You Use Drugs or Alcohol? Last night What Did You Use and How Much? Yesterday, unknown amount  Do You Currently Have a Therapist/Psychiatrist? No  Name of Therapist/Psychiatrist:    Have You Been Recently Discharged From Any Office Practice or Programs? No  Explanation of Discharge From Practice/Program: na    CCA Screening Triage Referral Assessment Type of Contact: Face-to-Face  Telemedicine Service Delivery:   Is this Initial or Reassessment?   Date Telepsych consult ordered in [facility]:    Time Telepsych consult ordered in [facility]:    Location of  Assessment: [facility] Assessment Services  Provider Location: [facility] Assessment Services   Collateral Involvement: None at this time   Does Patient Have a Automotive Engineer Guardian? No  Legal Guardian Contact Information: na  Copy of Legal Guardianship Form: -- (na)  Legal Guardian Notified of Arrival: -- (na)  Legal Guardian Notified of Pending  Discharge: -- (na)  If Minor and Not Living with Parent(s), Who has Custody? adult  Is CPS involved or ever been involved? -- (none reported)  Is APS involved or ever been involved? -- (none reported)   Patient Determined To Be At Risk for Harm To Self or Others Based on Review of Patient Reported Information or Presenting Complaint? No  Method: No Plan  Availability of Means: Has close by  Intent: Vague intent or NA  Notification Required: No need or identified person  Additional Information for Danger to Others Potential: -- (na)  Additional Comments for Danger to Others Potential: none  Are There Guns or Other Weapons in Your Home? No (pt denied)  Types of Guns/Weapons: na  Are These Weapons Safely Secured?                            -- (na)  Who Could Verify You Are Able To Have These Secured: na  Do You Have any Outstanding Charges, Pending Court Dates, Parole/Probation? Pending court date in December for [place] charges  Contacted To Inform of Risk of Harm To Self or Others: -- (na)    Does Patient Present under Involuntary Commitment? No    [place] of Residence: [place]   Patient Currently Receiving the Following Services: Not Receiving Services   Determination of Need: Urgent (48 hours) (Per Gaylan Mccoy NP Pt is recommended for detox in [facility])   Options For Referral: Facility-Based Crisis     CCA Biopsychosocial Patient Reported Schizophrenia/Schizoaffective Diagnosis in Past: No   Strengths: able to ask for and accept help   Mental Health Symptoms Depression:  Change in energy/activity; Hopelessness; Irritability   Duration of Depressive symptoms: Duration of Depressive Symptoms: Greater than two weeks   Mania:  None   Anxiety:   Fatigue; Difficulty concentrating; Irritability; Restlessness   Psychosis:  None   Duration of Psychotic symptoms:    Trauma:  Irritability/anger   Obsessions:  None   Compulsions:  None    Inattention:  None   Hyperactivity/Impulsivity:  N/A   Oppositional/Defiant Behaviors:  None   Emotional Irregularity:  None   Other Mood/Personality Symptoms:  NA    Mental Status Exam Appearance and self-care  Stature:  Average   Weight:  Average weight   Clothing:  Age-appropriate   Grooming:  Normal   Cosmetic use:  None   Posture/gait:  Normal   Motor activity:  Not Remarkable   Sensorium  Attention:  Normal   Concentration:  Normal   Orientation:  X5   Recall/memory:  Normal   Affect and Mood  Affect:  Flat   Mood:  Irritable   Relating  Eye contact:  Normal   Facial expression:  Anxious   Attitude toward examiner:  Dramatic; Irritable   Thought and Language  Speech flow: Normal   Thought content:  Appropriate to Mood and Circumstances   Preoccupation:  None   Hallucinations:  None   Organization:  Coherent   [employer] of Knowledge:  Average   Intelligence:  Average   Abstraction:  Functional  Judgement:  Poor   Reality Testing:  Adequate   Insight:  Poor; Lacking; Gaps   Decision Making:  Impulsive   Social Functioning  Social Maturity:  Irresponsible; Impulsive   Social Judgement:  Chief Of Staff   Stress  Stressors:  Other (Comment) (substance use)   Coping Ability:  Exhausted   Skill Deficits:  Decision making; Responsibility; Self-care   Supports:  Support needed     Religion: Religion/Spirituality Are You A Religious Person?: No How Might This Affect Treatment?: na  Leisure/Recreation: Leisure / Recreation Do You Have Hobbies?: No  Exercise/Diet: Exercise/Diet Do You Exercise?: No Have You Gained or Lost A Significant Amount of Weight in the Past Six Months?: No Do You Follow a Special Diet?: No Do You Have Any Trouble Sleeping?: No   CCA Employment/Education Employment/Work Situation: Employment / Work Situation Employment Situation: Unemployed Patient's Job has Been Impacted  by Current Illness:  (na) Has Patient ever Been in the [employer]?: No  Education: Education Is Patient Currently Attending School?: No Last Grade Completed: 12 Did You Product Manager?: No Did You Have An Individualized Education Program (IIEP): No Did You Have Any Difficulty At School?: No Patient's Education Has Been Impacted by Current Illness: No   CCA Family/Childhood History Family and Relationship History: Family history Marital status: Single Does patient have children?: Yes How many children?: 3 How is patient's relationship with their children?: Parent not currently seeing children  Childhood History:  Childhood History By whom was/is the patient raised?: Mother Did patient suffer any verbal/emotional/physical/sexual abuse as a child?: No Has patient ever been sexually abused/assaulted/raped as an adolescent or adult?: No Witnessed domestic violence?: No Has patient been affected by domestic violence as an adult?: No       CCA Substance Use Alcohol/Drug Use: Alcohol / Drug Use Pain Medications: See MAR Prescriptions: See MAR Over the Counter: See MAR History of alcohol / drug use?: Yes Longest period of sobriety (when/how long): unknown Negative Consequences of Use:  (none reported) Withdrawal Symptoms:  (none noted) Substance #1 Name of Substance 1: fentanyl 1 - Age of First Use: 19 1 - Amount (size/oz): $150 worth 1 - Frequency: daily 1 - Duration: ongoing 1 - Last Use / Amount: Yesterday 1 - Method of Aquiring: unknown 1- Route of Use: IV Substance #2 Name of Substance 2: xanax 2 - Age of First Use: unknown 2 - Amount (size/oz): unknown 2 - Frequency: several times a week 2 - Duration: ongoing 2 - Last Use / Amount: 3 days ago 2 - Method of Aquiring: unknown 2 - Route of Substance Use: oral Substance #3 Name of Substance 3: methamphetamine 3 - Age of First Use: 25 3 - Amount (size/oz): 1/2 gm 3 - Frequency: weekly 3 - Duration: ongoing 3 -  Last Use / Amount: 2 days ago 3 - Method of Aquiring: unknown 3 - Route of Substance Use: IV                   ASAM's:  Six Dimensions of Multidimensional Assessment  Dimension 1:  Acute Intoxication and/or Withdrawal Potential:   Dimension 1:  Description of individual's past and current experiences of substance use and withdrawal: Pt reported a hx of seizures from withdrawal of xanax  Dimension 2:  Biomedical Conditions and Complications:   Dimension 2:  Description of patient's biomedical conditions and  complications: none reported  Dimension 3:  Emotional, Behavioral, or Cognitive Conditions and Complications:  Dimension 3:  Description of emotional,  behavioral, or cognitive conditions and complications: Hx of MDD  Dimension 4:  Readiness to Change:  Dimension 4:  Description of Readiness to Change criteria: Moderate  Dimension 5:  Relapse, Continued use, or Continued Problem Potential:  Dimension 5:  Relapse, continued use, or continued problem potential critiera description: Moderate  Dimension 6:  Recovery/Living Environment:  Dimension 6:  Recovery/Iiving environment criteria description: Moderate  ASAM Severity Score: ASAM's Severity Rating Score: 8  ASAM Recommended Level of Treatment: ASAM Recommended Level of Treatment: Level II Intensive Outpatient Treatment   Substance use Disorder (SUD) Substance Use Disorder (SUD)  Checklist Symptoms of Substance Use:  (NA)  Recommendations for Services/Supports/Treatments: Recommendations for Services/Supports/Treatments Recommendations For Services/Supports/Treatments: CD-IOP Intensive Chemical Dependency Program  Disposition Recommendation per psychiatric provider: We recommend transfer to [facility]. 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

## 2024-05-11 NOTE — ED Notes (Signed)
 Patient showing signs of withdrawal. RN called Page, LPN on [facility] to consult with Fidela, RN related to possible transfer to [facility] tonight.

## 2024-05-11 NOTE — ED Notes (Signed)
 Pt presented to [facility] voluntarily and admitted to continuous assessment unit w/ c/o wanting detox. Hx includes: bipolar d/o, substance use d/o. Pt requesting detox from xanax and fentanyl. Denies SI/HI/AVH. Pt is somewhat irritable, but mostly calm and cooperative. Skin assessment: remarkable for track marks to bilateral arms. Oriented to unit. Denies need of anything at this time. Pt in NAD at this time. Encouragement and support given. Will continue to monitor.

## 2024-05-11 NOTE — ED Notes (Addendum)
 Pt in bed, pt states that he has some back pain and is feeling very anxious, pt denies si or hi at this time, pt calm and cooperative.  Pt requests tylenol  and something for anxiety.

## 2024-05-11 NOTE — ED Provider Notes (Addendum)
 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 Patient reports that he was sent to the ED for medical clearance and has since returned following clearance. He reports a mental health history of bipolar disorder, GAD, opioid use disorder, and alcohol use disorder. He does not currently have a therapist or psychiatrist.  His medical history includes hepatitis C and osteoarthritis. He also reports a history of seizures a few months ago while withdrawing from Xanax. He denies suicidal ideation, homicidal ideation, auditory or visual hallucinations, and paranoia. Pt was medically cleared in the ED.   David Andrews, is seen face to face by this provider, consulted with Dr. Lawrnce; and chart reviewed on 05/11/24.  On evaluation David Andrews reports homelessness. He endorses fentanyl use via IV, $150 daily, with last use on Sunday. He began using fentanyl at age 67. He also reports Xanax use, 10 mg orally daily, with last use three days ago, starting around age 57-15. Additionally, he reports methamphetamine use via IV, approximately half a gram weekly, with last use a couple of days ago. He began using methamphetamine at age 89.  He is reporting generalized body aches due to withdrawals.  He denies access to weapons or firearms and reports having no family support. He reports a has a history of rehabilitation but is unable to recall specific dates. He has a pending court date on December  16 for larceny. He reports feelings of guilt and hopelessness, difficulty concentrating, excessive worry, and irritability. He expresses a desire to detox and enter long-term rehabilitation for substance abuse. The patient has been medically cleared in the emergency department.  During evaluation David Andrews is laying on two chairs and appears to be experiencing withdrawal symptoms, screaming. He is alert & oriented x 4, irritable, withdrawing during this assessment.  His mood is anxious and irritable with congruent affect. He has normal speech, and behavior.  Objectively there is no evidence of psychosis/mania or delusional thinking. Pt does not appear to be responding to internal or external stimuli.  Patient is able to converse coherently, goal directed thoughts, no distractibility, or pre-occupation.  He denies suicidal/self-harm/homicidal ideation, psychosis, and paranoia.  Patient answered question appropriately.     Total Time spent with patient: 45 minutes  Musculoskeletal  Strength & Muscle Tone: within normal limits Gait & Station: normal Patient leans: N/A  Psychiatric Specialty Exam  Presentation General Appearance:  Fairly Groomed  Eye Contact: Minimal  Speech: Clear and Coherent; Normal Rate  Speech Volume: Normal  Handedness: Right   Mood and Affect  Mood: Irritable  Affect: Congruent   Thought Process  Thought Processes: Coherent  Descriptions of Associations:Intact  Orientation:Full (Time, Place and Person)  Thought Content:WDL    Hallucinations:Hallucinations: None  Ideas of Reference:None  Suicidal Thoughts:Suicidal Thoughts: No  Homicidal Thoughts:Homicidal Thoughts: No   Sensorium  Memory: Immediate Fair  Judgment: Fair  Insight: Fair   Art Therapist  Concentration: Fair  Attention Span: Fair  Recall: [employer] of Knowledge: Fair  Language: Fair   Psychomotor Activity  Psychomotor  Activity: Psychomotor Activity: Normal   Assets  Assets: Desire for Improvement   Sleep  Sleep: Sleep: Poor   No data recorded  Physical Exam Vitals reviewed.  Constitutional:      Appearance: Normal appearance.  HENT:     Head: Normocephalic and atraumatic.     Nose: Nose normal.     Mouth/Throat:     Pharynx: Oropharynx is clear.  Cardiovascular:     Rate and Rhythm: Normal rate.  Pulmonary:     Effort: Pulmonary effort is normal.  Musculoskeletal:        General: Normal range of motion.     Cervical back: Normal range of motion.  Neurological:     Mental Status: He is alert and oriented to person, place, and time.  Psychiatric:        Attention and Perception: Attention and perception normal.        Mood and Affect: Mood is anxious.        Speech: Speech normal.        Behavior: Behavior is agitated.        Thought Content: Thought content normal.        Cognition and Memory: Cognition normal.        Judgment: Judgment normal.    Review of Systems  Psychiatric/Behavioral:  Positive for depression and substance abuse. The patient is nervous/anxious and has insomnia.   All other systems reviewed and are negative.   Blood pressure (!) 127/90, pulse (!) 111, temperature 98.2 F (36.8 C), temperature source Oral, resp. rate 18, SpO2 98%. There is no height or weight on file to calculate BMI.  Past Psychiatric History: Bipolar disorder, GAD; Substance use hx:  Polysubstance dependence, alcohol abuse  Is the patient at risk to self? No  Has the patient been a risk to self in the past 6 months? No .    Has the patient been a risk to self within the distant past? No   Is the patient a risk to others? No   Has the patient been a risk to others in the past 6 months? No   Has the patient been a risk to others within the distant past? No   Past Medical History: Hepatitis C, Osteoarthritis  Family History: Unable to assess. The patient reports the relationship is  strained.  Social History: Completed 12th grade and is currently experiencing homelessness. He is unemployed and has a pending court date on December 16 for a larceny charge.  Last Labs:  Admission on 05/10/2024, Discharged on 05/11/2024  Component Date Value Ref Range Status   Sodium 05/10/2024 136  135 - 145 mmol/L Final   Potassium 05/10/2024 3.5  3.5 - 5.1 mmol/L Final   Chloride 05/10/2024 101  98 - 111 mmol/L Final   CO2 05/10/2024 23  22 - 32 mmol/L Final   Glucose, Bld 05/10/2024 76  70 - 99 mg/dL Final   Glucose reference range applies only to samples taken after fasting for at least 8 hours.   BUN 05/10/2024 8  6 - 20 mg/dL Final   Creatinine, Ser 05/10/2024 0.76  0.61 - 1.24 mg/dL Final   Calcium  05/10/2024 9.3  8.9 - 10.3 mg/dL Final   Total Protein 88/90/7974 6.8  6.5 - 8.1 g/dL Final   Albumin 88/90/7974 3.5  3.5 - 5.0 g/dL Final   AST 88/90/7974 33  15 - 41 U/L Final   ALT 05/10/2024 46 (H)  0 - 44 U/L Final   Alkaline  Phosphatase 05/10/2024 81  38 - 126 U/L Final   Total Bilirubin 05/10/2024 0.3  0.0 - 1.2 mg/dL Final   GFR, Estimated 05/10/2024 >60  >60 mL/min Final   Comment: (NOTE) Calculated using the CKD-EPI Creatinine Equation (2021)    Anion gap 05/10/2024 12  5 - 15 Final   Performed at [facility], [address], [place], [place] [address]   Alcohol, Ethyl (B) 05/10/2024 <15  <15 mg/dL Final   Comment: (NOTE) For medical purposes only. Performed at [facility], [address] [place], [place] [address]    WBC 05/10/2024 8.0  4.0 - 10.5 K/uL Final   RBC 05/10/2024 4.74  4.22 - 5.81 MIL/uL Final   Hemoglobin 05/10/2024 13.5  13.0 - 17.0 g/dL Final   HCT 88/90/7974 40.7  39.0 - 52.0 % Final   MCV 05/10/2024 85.9  80.0 - 100.0 fL Final   MCH 05/10/2024 28.5  26.0 - 34.0 pg Final   MCHC 05/10/2024 33.2  30.0 - 36.0 g/dL Final   RDW 88/90/7974 12.9  11.5 - 15.5 % Final   Platelets 05/10/2024 352  150 - 400 K/uL Final   nRBC 05/10/2024 0.0   0.0 - 0.2 % Final   Performed at [facility], [address], [place], [place] [address]   Opiates 05/10/2024 NONE DETECTED  NONE DETECTED Final   Cocaine 05/10/2024 POSITIVE (A)  NONE DETECTED Final   Benzodiazepines 05/10/2024 NONE DETECTED  NONE DETECTED Final   Amphetamines 05/10/2024 POSITIVE (A)  NONE DETECTED Final   Comment: (NOTE) Trazodone  is metabolized in vivo to several metabolites, including pharmacologically active m-CPP, which is excreted in the urine. Immunoassay screens for amphetamines and MDMA have potential cross-reactivity with these compounds and may provide false positive  results.     Tetrahydrocannabinol 05/10/2024 NONE DETECTED  NONE DETECTED Final   Barbiturates 05/10/2024 NONE DETECTED  NONE DETECTED Final   Comment: (NOTE) DRUG SCREEN FOR MEDICAL PURPOSES ONLY.  IF CONFIRMATION IS NEEDED FOR ANY PURPOSE, NOTIFY LAB WITHIN 5 DAYS.  LOWEST DETECTABLE LIMITS FOR URINE DRUG SCREEN Drug Class                     Cutoff (ng/mL) Amphetamine and metabolites    1000 Barbiturate and metabolites    200 Benzodiazepine                 200 Opiates and metabolites        300 Cocaine and metabolites        300 THC                            50 Performed at [facility], [address] [place], [place] [address]   Admission on 12/27/2023, Discharged on 12/27/2023  Component Date Value Ref Range Status   Sodium 12/27/2023 137  135 - 145 mmol/L Final   Potassium 12/27/2023 3.7  3.5 - 5.1 mmol/L Final   Chloride 12/27/2023 102  98 - 111 mmol/L Final   CO2 12/27/2023 22  22 - 32 mmol/L Final   Glucose, Bld 12/27/2023 90  70 - 99 mg/dL Final   Glucose reference range applies only to samples taken after fasting for at least 8 hours.   BUN 12/27/2023 8  6 - 20 mg/dL Final   Creatinine, Ser 12/27/2023 0.78  0.61 - 1.24 mg/dL Final   Calcium  12/27/2023 8.8 (L)  8.9 - 10.3 mg/dL Final   GFR, Estimated 12/27/2023 >60  >60 mL/min Final   Comment:  (NOTE) Calculated using the CKD-EPI Creatinine Equation (2021)    Anion gap 12/27/2023 13  5 - 15 Final   Performed at [facility], [address] [place], [place] [address]   WBC 12/27/2023 6.8  4.0 - 10.5 K/uL Final   RBC 12/27/2023 4.48  4.22 - 5.81 MIL/uL Final   Hemoglobin 12/27/2023 13.4  13.0 - 17.0 g/dL Final   HCT 93/72/7974 40.2  39.0 - 52.0 % Final   MCV 12/27/2023 89.7  80.0 - 100.0 fL Final   MCH 12/27/2023 29.9  26.0 - 34.0 pg Final   MCHC 12/27/2023 33.3  30.0 - 36.0 g/dL Final   RDW 93/72/7974 12.8  11.5 - 15.5 % Final   Platelets 12/27/2023 332  150 - 400 K/uL Final   nRBC 12/27/2023 0.0  0.0 - 0.2 % Final   Performed at [facility], [address], [place], [place] [address]  Admission on 11/29/2023, Discharged on 11/29/2023  Component Date Value Ref Range Status   Color, Urine 11/29/2023 AMBER (A)  YELLOW Final   BIOCHEMICALS MAY BE AFFECTED BY COLOR   APPearance 11/29/2023 CLEAR  CLEAR Final   Specific Gravity, Urine 11/29/2023 1.021  1.005 - 1.030 Final   pH 11/29/2023 5.0  5.0 - 8.0 Final   Glucose, UA 11/29/2023 NEGATIVE  NEGATIVE mg/dL Final   Hgb urine dipstick 11/29/2023 NEGATIVE  NEGATIVE Final   Bilirubin Urine 11/29/2023 NEGATIVE  NEGATIVE Final   Ketones, ur 11/29/2023 NEGATIVE  NEGATIVE mg/dL Final   Protein, ur 94/69/7974 30 (A)  NEGATIVE mg/dL Final   Nitrite 94/69/7974 NEGATIVE  NEGATIVE Final   Leukocytes,Ua 11/29/2023 NEGATIVE  NEGATIVE Final   RBC / HPF 11/29/2023 0-5  0 - 5 RBC/hpf Final   WBC, UA 11/29/2023 0-5  0 - 5 WBC/hpf Final   Bacteria, UA 11/29/2023 NONE SEEN  NONE SEEN Final   Squamous Epithelial / HPF 11/29/2023 0-5  0 - 5 /HPF Final   Mucus 11/29/2023 PRESENT   Final   Performed at [facility], [address] [place], [place] [address]   Sodium 11/29/2023 133 (L)  135 - 145 mmol/L Final   Potassium 11/29/2023 3.1 (L)  3.5 - 5.1 mmol/L Final   Chloride 11/29/2023 93 (L)  98 - 111 mmol/L Final    CO2 11/29/2023 30  22 - 32 mmol/L Final   Glucose, Bld 11/29/2023 99  70 - 99 mg/dL Final   Glucose reference range applies only to samples taken after fasting for at least 8 hours.   BUN 11/29/2023 10  6 - 20 mg/dL Final   Creatinine, Ser 11/29/2023 0.76  0.61 - 1.24 mg/dL Final   Calcium  11/29/2023 9.1  8.9 - 10.3 mg/dL Final   GFR, Estimated 11/29/2023 >60  >60 mL/min Final   Comment: (NOTE) Calculated using the CKD-EPI Creatinine Equation (2021)    Anion gap 11/29/2023 10  5 - 15 Final   Performed at [facility], [address] [place], [place] [address]   WBC 11/29/2023 7.2  4.0 - 10.5 K/uL Final   RBC 11/29/2023 5.05  4.22 - 5.81 MIL/uL Final   Hemoglobin 11/29/2023 15.5  13.0 - 17.0 g/dL Final   HCT 94/69/7974 44.7  39.0 - 52.0 % Final   MCV 11/29/2023 88.5  80.0 - 100.0 fL Final   MCH 11/29/2023 30.7  26.0 - 34.0 pg Final   MCHC 11/29/2023 34.7  30.0 - 36.0 g/dL Final   RDW 94/69/7974 11.9  11.5 - 15.5 % Final   Platelets 11/29/2023 292  150 - 400 K/uL Final   nRBC 11/29/2023 0.0  0.0 - 0.2 % Final   Performed at [facility], [address], [place], [place] [address]    Allergies: Mustard and Wellbutrin [bupropion]  Medications:  Facility Ordered Medications  Medication   [COMPLETED] acetaminophen  (TYLENOL ) tablet 1,000 mg   [COMPLETED] ibuprofen  (ADVIL ) tablet 600 mg   PTA Medications  Medication Sig   ondansetron  (ZOFRAN -ODT) 4 MG disintegrating tablet Take 1 tablet (4 mg total) by mouth every 4 (four) hours as needed for nausea or vomiting. (Patient not taking: Reported on 12/27/2023)   hydrOXYzine  (ATARAX ) 25 MG tablet Take 1 tablet (25 mg total) by mouth every 8 (eight) to 12 (twelve) hours as needed for anxiety. (Patient not taking: Reported on 12/27/2023)   naproxen  (NAPROSYN ) 500 MG tablet Take 1 tablet (500 mg total) by mouth 2 (two) times daily.      Medical Decision Making  Pt admitted to continuous observation  pending bed availability on FBC.   Labs obtained at the ED -CBC w/ differential/platelet -CMP -Ethanol -Hemoglobin A1c -TSH   -EKG 12-Lead   Medications: -Tylenol , 650mg , oral, every 6 hours PRN, mild pain --Atarax  25mg , oral, 3 times daily PRN, anxiety -Desyrel , 50mg , oral, at bedtime PRN, sleep -Benadryl 50mg  at Q8HRS PO PRN - allergies  Agitation protocol  CIWA protocol  -Librium taper  COWS monitoring -Clonidine  taper -Tylenol  650 mg every 6 hours as needed for mild pain -Naproxen  500 mg BID as needed for pain -Bentyl  20 mg every 6 hours as needed for spasms/abdominal cramping -Robaxin  500 mg every 8 hours as needed for muscle spasms -Zofran  4 mg every 6 hours as needed for nausea or vomiting -Imodium  2 to 4 mg as needed for diarrhea or loose stools -Maalox/Mylanta 30 mL every 4 hours as needed for indigestion -Milk of Mag 30 mL as needed for constipation     Dr. Lawrnce sent EKG to review, might be substance induced, waiting for changed to plan of care if indicated.    Recommendations  Based on my evaluation the patient does not appear to have an emergency medical condition. Pt has been recommended for FBC.   David Jonny Dearden, NP 05/11/24  2:41 PM

## 2024-05-12 ENCOUNTER — Other Ambulatory Visit (HOSPITAL_COMMUNITY)
Admission: EM | Admit: 2024-05-12 | Discharge: 2024-05-18 | Source: Intra-hospital | Attending: Psychiatry | Admitting: Psychiatry

## 2024-05-12 DIAGNOSIS — F1190 Opioid use, unspecified, uncomplicated: Secondary | ICD-10-CM | POA: Diagnosis present

## 2024-05-12 DIAGNOSIS — F411 Generalized anxiety disorder: Secondary | ICD-10-CM | POA: Insufficient documentation

## 2024-05-12 DIAGNOSIS — F514 Sleep terrors [night terrors]: Secondary | ICD-10-CM | POA: Insufficient documentation

## 2024-05-12 DIAGNOSIS — Z653 Problems related to other legal circumstances: Secondary | ICD-10-CM | POA: Insufficient documentation

## 2024-05-12 DIAGNOSIS — Z5900 Homelessness unspecified: Secondary | ICD-10-CM | POA: Insufficient documentation

## 2024-05-12 DIAGNOSIS — F1510 Other stimulant abuse, uncomplicated: Secondary | ICD-10-CM | POA: Diagnosis not present

## 2024-05-12 DIAGNOSIS — F1010 Alcohol abuse, uncomplicated: Secondary | ICD-10-CM | POA: Diagnosis not present

## 2024-05-12 DIAGNOSIS — F1123 Opioid dependence with withdrawal: Secondary | ICD-10-CM | POA: Insufficient documentation

## 2024-05-12 DIAGNOSIS — F1310 Sedative, hypnotic or anxiolytic abuse, uncomplicated: Secondary | ICD-10-CM | POA: Diagnosis not present

## 2024-05-12 DIAGNOSIS — F1920 Other psychoactive substance dependence, uncomplicated: Secondary | ICD-10-CM

## 2024-05-12 DIAGNOSIS — Z560 Unemployment, unspecified: Secondary | ICD-10-CM | POA: Insufficient documentation

## 2024-05-12 DIAGNOSIS — F1910 Other psychoactive substance abuse, uncomplicated: Secondary | ICD-10-CM | POA: Diagnosis present

## 2024-05-12 DIAGNOSIS — F3189 Other bipolar disorder: Secondary | ICD-10-CM | POA: Insufficient documentation

## 2024-05-12 MED ORDER — ACETAMINOPHEN 325 MG PO TABS: 650.0000 mg | ORAL_TABLET | Freq: Four times a day (QID) | ORAL | Status: DC | PRN

## 2024-05-12 MED ORDER — DIPHENHYDRAMINE HCL 50 MG PO CAPS: 50.0000 mg | ORAL_CAPSULE | Freq: Three times a day (TID) | ORAL | Status: DC | PRN

## 2024-05-12 MED ORDER — CHLORDIAZEPOXIDE HCL 25 MG PO CAPS
25.0000 mg | ORAL_CAPSULE | Freq: Four times a day (QID) | ORAL | Status: AC
  Administered 2024-05-12 (×2): 25 mg via ORAL
  Filled 2024-05-12 (×2): qty 1

## 2024-05-12 MED ORDER — TRAZODONE HCL 50 MG PO TABS
50.0000 mg | ORAL_TABLET | Freq: Every evening | ORAL | Status: DC | PRN
  Administered 2024-05-12 – 2024-05-17 (×7): 50 mg via ORAL
  Filled 2024-05-12: qty 1
  Filled 2024-05-12: qty 7
  Filled 2024-05-12: qty 1
  Filled 2024-05-12: qty 7
  Filled 2024-05-12: qty 1
  Filled 2024-05-12: qty 7
  Filled 2024-05-12 (×3): qty 1
  Filled 2024-05-12: qty 7
  Filled 2024-05-12: qty 1

## 2024-05-12 MED ORDER — CHLORDIAZEPOXIDE HCL 25 MG PO CAPS
25.0000 mg | ORAL_CAPSULE | Freq: Every day | ORAL | Status: AC
  Administered 2024-05-15: 25 mg via ORAL
  Filled 2024-05-12: qty 1

## 2024-05-12 MED ORDER — LORAZEPAM 2 MG/ML IJ SOLN: 2.0000 mg | Freq: Three times a day (TID) | INTRAMUSCULAR | Status: DC | PRN

## 2024-05-12 MED ORDER — MAGNESIUM HYDROXIDE 400 MG/5ML PO SUSP: 30.0000 mL | Freq: Every day | ORAL | Status: DC | PRN

## 2024-05-12 MED ORDER — HALOPERIDOL LACTATE 5 MG/ML IJ SOLN: 10.0000 mg | Freq: Three times a day (TID) | INTRAMUSCULAR | Status: DC | PRN

## 2024-05-12 MED ORDER — LOPERAMIDE HCL 2 MG PO CAPS
2.0000 mg | ORAL_CAPSULE | ORAL | Status: AC | PRN
  Administered 2024-05-12 – 2024-05-13 (×2): 2 mg via ORAL
  Filled 2024-05-12 (×2): qty 1

## 2024-05-12 MED ORDER — HALOPERIDOL LACTATE 5 MG/ML IJ SOLN: 5.0000 mg | Freq: Three times a day (TID) | INTRAMUSCULAR | Status: DC | PRN

## 2024-05-12 MED ORDER — CLONIDINE HCL 0.1 MG PO TABS
0.1000 mg | ORAL_TABLET | Freq: Four times a day (QID) | ORAL | Status: AC
  Administered 2024-05-12 – 2024-05-13 (×6): 0.1 mg via ORAL
  Filled 2024-05-12 (×6): qty 1

## 2024-05-12 MED ORDER — HALOPERIDOL 5 MG PO TABS: 5.0000 mg | ORAL_TABLET | Freq: Three times a day (TID) | ORAL | Status: DC | PRN

## 2024-05-12 MED ORDER — DIPHENHYDRAMINE HCL 50 MG/ML IJ SOLN: 50.0000 mg | Freq: Three times a day (TID) | INTRAMUSCULAR | Status: DC | PRN

## 2024-05-12 MED ORDER — CHLORDIAZEPOXIDE HCL 25 MG PO CAPS: 25.0000 mg | ORAL_CAPSULE | Freq: Four times a day (QID) | ORAL | Status: AC | PRN

## 2024-05-12 MED ORDER — DIPHENHYDRAMINE HCL 50 MG PO CAPS
50.0000 mg | ORAL_CAPSULE | Freq: Four times a day (QID) | ORAL | Status: DC | PRN
  Administered 2024-05-16: 50 mg via ORAL
  Filled 2024-05-12: qty 1

## 2024-05-12 MED ORDER — CHLORDIAZEPOXIDE HCL 25 MG PO CAPS
25.0000 mg | ORAL_CAPSULE | ORAL | Status: AC
  Administered 2024-05-14 (×2): 25 mg via ORAL
  Filled 2024-05-12 (×2): qty 1

## 2024-05-12 MED ORDER — ADULT MULTIVITAMIN W/MINERALS CH
1.0000 | ORAL_TABLET | Freq: Every day | ORAL | Status: DC
  Administered 2024-05-13 – 2024-05-18 (×6): 1 via ORAL
  Filled 2024-05-12 (×6): qty 1

## 2024-05-12 MED ORDER — CLONIDINE HCL 0.1 MG PO TABS
0.1000 mg | ORAL_TABLET | ORAL | Status: DC
  Administered 2024-05-14: 0.1 mg via ORAL
  Filled 2024-05-12: qty 1

## 2024-05-12 MED ORDER — ALUM & MAG HYDROXIDE-SIMETH 200-200-20 MG/5ML PO SUSP
30.0000 mL | ORAL | Status: DC | PRN
  Administered 2024-05-15 – 2024-05-17 (×4): 30 mL via ORAL
  Filled 2024-05-12 (×4): qty 30

## 2024-05-12 MED ORDER — CLONIDINE HCL 0.1 MG PO TABS: 0.1000 mg | ORAL_TABLET | Freq: Every day | ORAL | Status: DC

## 2024-05-12 MED ORDER — CHLORDIAZEPOXIDE HCL 25 MG PO CAPS
25.0000 mg | ORAL_CAPSULE | Freq: Three times a day (TID) | ORAL | Status: AC
  Administered 2024-05-13 (×3): 25 mg via ORAL
  Filled 2024-05-12 (×3): qty 1

## 2024-05-12 MED ORDER — NICOTINE 21 MG/24HR TD PT24
21.0000 mg | MEDICATED_PATCH | Freq: Every day | TRANSDERMAL | Status: DC
  Administered 2024-05-13 – 2024-05-18 (×6): 21 mg via TRANSDERMAL
  Filled 2024-05-12 (×6): qty 1

## 2024-05-12 MED ORDER — NAPROXEN 500 MG PO TABS
500.0000 mg | ORAL_TABLET | Freq: Two times a day (BID) | ORAL | Status: AC | PRN
  Administered 2024-05-12 – 2024-05-15 (×5): 500 mg via ORAL
  Filled 2024-05-12 (×5): qty 1

## 2024-05-12 MED ORDER — THIAMINE MONONITRATE 100 MG PO TABS
100.0000 mg | ORAL_TABLET | Freq: Every day | ORAL | Status: DC
  Administered 2024-05-13 – 2024-05-18 (×6): 100 mg via ORAL
  Filled 2024-05-12 (×7): qty 1

## 2024-05-12 MED ORDER — METHOCARBAMOL 500 MG PO TABS
500.0000 mg | ORAL_TABLET | Freq: Three times a day (TID) | ORAL | Status: AC | PRN
  Administered 2024-05-12 – 2024-05-15 (×3): 500 mg via ORAL
  Filled 2024-05-12 (×3): qty 1

## 2024-05-12 MED ORDER — ONDANSETRON 4 MG PO TBDP
4.0000 mg | ORAL_TABLET | Freq: Four times a day (QID) | ORAL | Status: AC | PRN
  Administered 2024-05-12: 4 mg via ORAL
  Filled 2024-05-12: qty 1

## 2024-05-12 MED ORDER — HYDROXYZINE HCL 25 MG PO TABS
25.0000 mg | ORAL_TABLET | Freq: Four times a day (QID) | ORAL | Status: AC | PRN
  Administered 2024-05-12 – 2024-05-14 (×4): 25 mg via ORAL
  Filled 2024-05-12 (×4): qty 1

## 2024-05-12 MED ORDER — DICYCLOMINE HCL 20 MG PO TABS
20.0000 mg | ORAL_TABLET | Freq: Four times a day (QID) | ORAL | Status: AC | PRN
  Administered 2024-05-12: 20 mg via ORAL
  Filled 2024-05-12: qty 1

## 2024-05-12 NOTE — ED Notes (Signed)
 Pt lying in bed, restless and grunting every few minutes. Prn meds given, waiting on effect. Respirations even and unlabored. Will continue to monitor for safety.

## 2024-05-12 NOTE — Progress Notes (Signed)
 Pt is asleep. Respirations are even and unlabored. No signs of acute distress noted. Staff will monitor for pt's safety.

## 2024-05-12 NOTE — ED Provider Notes (Signed)
 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  Transferred voluntarily from [facility] emergency department to [facility] behavioral health continuous observation unit 05/11/2024.  Patient remains voluntary.    Stay Summary: Patient was evaluated by the attending provider.   Patient presented with   Suboxone, purchased illicitly.  Patient denied additional illicit substances. He reported a history of polysubstance use disorder.  Most recent opioid use 4 days ago.  Most recent methamphetamine use 2 days ago.  Most recent illicit benzodiazepine use 4 days ago.  Patient endorsed  symptoms of opioid withdrawal including joint pain, nausea and anxiety.Patient reported  he is  trying to go to rehab upon detox.  Reported he has  talked to [place] [facility] and has had treatment there 2 years ago.     Patient is seen today on Observation unit.  He  is alert and oriented, and  cooperative with the  assessment.  He is alert and oriented, calm. He is casually dressed and groomed. His thought process is coherent and speech is clear, well articulated.  Eye contact is fair. He does not seem to be preoccupied or responding to internal stimuli. He does admit to using drugs, excessively but looking for treatment and rehab services. Reports he currently has no outpatient services. No current medications. Has not been seeing any provider for many years. Reports a hx of inpatient but not recently.  Patient denies SI/HI/AVH and states  his goal is to go to treatment, working on sobriety.  Patient reports no family hx of SA or mental illness. He reports that his appetite is good and sleep is fair.   Patient presented voluntarily requesting treatment for his substance abuse problem. He will be admitted voluntarily to [facility] for treatment, with possibility to continue to rehab services.  Total Time spent with patient: 30 minutes  Past Psychiatric History: Polysubstance abuse,  Past Medical History: NA Family History: NA Family Psychiatric History: NA Social History: NA Tobacco Cessation:  A prescription for an FDA-approved tobacco cessation medication provided at discharge  Current Medications:  Current Facility-Administered Medications  Medication Dose Route Frequency Provider Last Rate Last Admin   acetaminophen  (TYLENOL ) tablet 650 mg  650 mg Oral Q6H PRN Olasunkanmi, Oluwatosin, NP       alum & mag hydroxide-simeth (MAALOX/MYLANTA) 200-200-20 MG/5ML suspension 30 mL  30 mL Oral Q4H PRN Olasunkanmi, Oluwatosin, NP       chlordiazePOXIDE (LIBRIUM) capsule 25 mg  25 mg Oral Q6H PRN Olasunkanmi, Oluwatosin, NP       chlordiazePOXIDE (LIBRIUM) capsule 25 mg  25 mg Oral QID Olasunkanmi, Oluwatosin, NP   25 mg at 05/12/24 1329   Followed by   chlordiazePOXIDE (LIBRIUM) capsule 25 mg  25 mg Oral TID Olasunkanmi, Oluwatosin, NP       Followed by   NOREEN ON 05/13/2024] chlordiazePOXIDE (LIBRIUM) capsule 25 mg  25 mg Oral BH-qamhs Olasunkanmi, Oluwatosin, NP       Followed by   NOREEN ON 05/14/2024] chlordiazePOXIDE (LIBRIUM) capsule 25 mg  25 mg Oral Daily Olasunkanmi, Oluwatosin, NP  cloNIDine  (CATAPRES ) tablet 0.1 mg  0.1 mg Oral QID Olasunkanmi, Oluwatosin, NP   0.1 mg at 05/12/24 1329   Followed by   NOREEN ON 05/13/2024] cloNIDine  (CATAPRES ) tablet 0.1 mg  0.1 mg Oral BH-qamhs Olasunkanmi, Oluwatosin, NP       Followed by   [START ON 05/16/2024] cloNIDine  (CATAPRES ) tablet 0.1 mg  0.1 mg Oral QAC breakfast Olasunkanmi,  Oluwatosin, NP       dicyclomine  (BENTYL ) tablet 20 mg  20 mg Oral Q6H PRN Olasunkanmi, Oluwatosin, NP   20 mg at 05/11/24 2201   haloperidol (HALDOL) tablet 5 mg  5 mg Oral TID PRN Olasunkanmi, Oluwatosin, NP   5 mg at 05/11/24 2330   And   diphenhydrAMINE (BENADRYL) capsule 50 mg  50 mg Oral TID PRN Olasunkanmi, Oluwatosin, NP   50 mg at 05/11/24 2330   diphenhydrAMINE (BENADRYL) capsule 50 mg  50 mg Oral Q6H PRN Olasunkanmi, Oluwatosin, NP       haloperidol lactate (HALDOL) injection 5 mg  5 mg Intramuscular TID PRN Olasunkanmi, Oluwatosin, NP       And   diphenhydrAMINE (BENADRYL) injection 50 mg  50 mg Intramuscular TID PRN Olasunkanmi, Oluwatosin, NP       And   LORazepam  (ATIVAN ) injection 2 mg  2 mg Intramuscular TID PRN Olasunkanmi, Oluwatosin, NP       haloperidol lactate (HALDOL) injection 10 mg  10 mg Intramuscular TID PRN Olasunkanmi, Oluwatosin, NP       And   diphenhydrAMINE (BENADRYL) injection 50 mg  50 mg Intramuscular TID PRN Olasunkanmi, Oluwatosin, NP       And   LORazepam  (ATIVAN ) injection 2 mg  2 mg Intramuscular TID PRN Olasunkanmi, Oluwatosin, NP       hydrOXYzine  (ATARAX ) tablet 25 mg  25 mg Oral Q6H PRN Olasunkanmi, Oluwatosin, NP   25 mg at 05/11/24 2200   loperamide  (IMODIUM ) capsule 2-4 mg  2-4 mg Oral PRN Olasunkanmi, Oluwatosin, NP       magnesium  hydroxide (MILK OF MAGNESIA) suspension 30 mL  30 mL Oral Daily PRN Olasunkanmi, Oluwatosin, NP       methocarbamol  (ROBAXIN ) tablet 500 mg  500 mg Oral Q8H PRN Olasunkanmi, Oluwatosin, NP   500 mg at 05/11/24 2254   multivitamin with minerals tablet 1 tablet  1 tablet Oral Daily Olasunkanmi, Oluwatosin, NP   1 tablet at 05/12/24 0945   naproxen  (NAPROSYN ) tablet 500 mg  500 mg Oral BID PRN Olasunkanmi, Oluwatosin, NP       nicotine  (NICODERM CQ  - dosed in mg/24 hours) patch 21 mg  21 mg Transdermal Q0600 Olasunkanmi, Oluwatosin, NP       ondansetron  (ZOFRAN -ODT) disintegrating tablet 4 mg  4 mg Oral Q6H PRN  Olasunkanmi, Oluwatosin, NP   4 mg at 05/11/24 2201   traZODone  (DESYREL ) tablet 50 mg  50 mg Oral QHS PRN Olasunkanmi, Oluwatosin, NP   50 mg at 05/11/24 2201   No current outpatient medications on file.    PTA Medications:  Facility Ordered Medications  Medication   [COMPLETED] acetaminophen  (TYLENOL ) tablet 1,000 mg   [COMPLETED] ibuprofen  (ADVIL ) tablet 600 mg   acetaminophen  (TYLENOL ) tablet 650 mg   alum & mag hydroxide-simeth (MAALOX/MYLANTA) 200-200-20 MG/5ML suspension 30 mL   magnesium  hydroxide (MILK OF MAGNESIA) suspension 30 mL   haloperidol (HALDOL) tablet 5 mg   And   diphenhydrAMINE (BENADRYL) capsule 50 mg   haloperidol lactate (HALDOL) injection 5 mg   And   diphenhydrAMINE (BENADRYL)  injection 50 mg   And   LORazepam  (ATIVAN ) injection 2 mg   haloperidol lactate (HALDOL) injection 10 mg   And   diphenhydrAMINE (BENADRYL) injection 50 mg   And   LORazepam  (ATIVAN ) injection 2 mg   diphenhydrAMINE (BENADRYL) capsule 50 mg   traZODone  (DESYREL ) tablet 50 mg   nicotine  (NICODERM CQ  - dosed in mg/24 hours) patch 21 mg   dicyclomine  (BENTYL ) tablet 20 mg   methocarbamol  (ROBAXIN ) tablet 500 mg   naproxen  (NAPROSYN ) tablet 500 mg   [COMPLETED] thiamine (VITAMIN B1) injection 100 mg   multivitamin with minerals tablet 1 tablet   chlordiazePOXIDE (LIBRIUM) capsule 25 mg   hydrOXYzine  (ATARAX ) tablet 25 mg   loperamide  (IMODIUM ) capsule 2-4 mg   ondansetron  (ZOFRAN -ODT) disintegrating tablet 4 mg   chlordiazePOXIDE (LIBRIUM) capsule 25 mg   Followed by   chlordiazePOXIDE (LIBRIUM) capsule 25 mg   Followed by   NOREEN ON 05/13/2024] chlordiazePOXIDE (LIBRIUM) capsule 25 mg   Followed by   NOREEN ON 05/14/2024] chlordiazePOXIDE (LIBRIUM) capsule 25 mg   cloNIDine  (CATAPRES ) tablet 0.1 mg   Followed by   NOREEN ON 05/13/2024] cloNIDine  (CATAPRES ) tablet 0.1 mg   Followed by   NOREEN ON 05/16/2024] cloNIDine  (CATAPRES ) tablet 0.1 mg       05/12/2024   10:26  AM 08/15/2021   10:34 AM 07/18/2021    8:43 AM  Depression screen PHQ 2/9  Decreased Interest 1 0 0  Down, Depressed, Hopeless 1 0 0  PHQ - 2 Score 2 0 0  Altered sleeping 1    Tired, decreased energy 1    Change in appetite 1    Feeling bad or failure about yourself  0    Trouble concentrating 0    Moving slowly or fidgety/restless 1    Suicidal thoughts 0    PHQ-9 Score 6      Flowsheet Row ED from 05/11/2024 in [facility] Most recent reading at 05/11/2024  6:13 PM ED from 05/10/2024 in [facility] Emergency Department at [facility] Most recent reading at 05/10/2024  9:25 PM ED from 05/10/2024 in [facility] Emergency Department at [facility] Most recent reading at 05/10/2024  5:07 PM  C-SSRS RISK CATEGORY No Risk No Risk No Risk    Musculoskeletal  Strength & Muscle Tone: within normal limits Gait & Station: normal Patient leans: N/A  Psychiatric Specialty Exam  Presentation  General Appearance:  Appropriate for Environment; Casual  Eye Contact: Fair  Speech: Clear and Coherent; Normal Rate  Speech Volume: Normal  Handedness: Right   Mood and Affect  Mood: Euthymic  Affect: Congruent   Thought Process  Thought Processes: Coherent; Goal Directed  Descriptions of Associations:Intact  Orientation:Full (Time, Place and Person)  Thought Content:Logical; WDL  Diagnosis of Schizophrenia or Schizoaffective disorder in past: No    Hallucinations:Hallucinations: Other (comment)  Ideas of Reference:None  Suicidal Thoughts:Suicidal Thoughts: No  Homicidal Thoughts:Homicidal Thoughts: No   Sensorium  Memory: Immediate Good; Recent Fair  Judgment: Poor  Insight: Shallow   Executive Functions  Concentration: Good  Attention Span: Good  Recall: Good  Fund of Knowledge: Good  Language: Good   Psychomotor Activity  Psychomotor Activity: Psychomotor Activity: Normal   Assets   Assets: Communication Skills; Desire for Improvement; Financial Resources/Insurance; Social Support; Resilience; Physical Health   Sleep  Sleep: Sleep: Fair  Estimated Sleeping Duration (Last 24 Hours): 12.00-15.25 hours (Due to Daylight Saving Time, the durations displayed may  not accurately represent documentation during the time change interval)  Nutritional Assessment (For OBS and [facility] admissions only) Has the patient had a weight loss or gain of 10 pounds or more in the last 3 months?: No Has the patient had a decrease in food intake/or appetite?: No Does the patient have dental problems?: No Does the patient have eating habits or behaviors that may be indicators of an eating disorder including binging or inducing vomiting?: No Has the patient recently lost weight without trying?: 0 Has the patient been eating poorly because of a decreased appetite?: 0 Malnutrition Screening Tool Score: 0    Physical Exam  Physical Exam Vitals and nursing note reviewed.  Constitutional:      Appearance: Normal appearance.  HENT:     Head: Normocephalic and atraumatic.     Right Ear: Tympanic membrane normal.     Left Ear: Tympanic membrane normal.     Nose: Nose normal.     Mouth/Throat:     Mouth: Mucous membranes are moist.  Eyes:     Extraocular Movements: Extraocular movements intact.     Pupils: Pupils are equal, round, and reactive to light.  Cardiovascular:     Rate and Rhythm: Normal rate.     Pulses: Normal pulses.  Pulmonary:     Effort: Pulmonary effort is normal.  Musculoskeletal:        General: Normal range of motion.     Cervical back: Normal range of motion and neck supple.  Neurological:     General: No focal deficit present.     Mental Status: He is alert and oriented to person, place, and time.  Psychiatric:        Thought Content: Thought content normal.    Review of Systems  Constitutional: Negative.   HENT: Negative.    Eyes: Negative.   Respiratory:  Negative.    Cardiovascular: Negative.   Gastrointestinal: Negative.   Genitourinary: Negative.   Musculoskeletal: Negative.   Skin: Negative.   Neurological: Negative.   Endo/Heme/Allergies: Negative.   Psychiatric/Behavioral:  Positive for substance abuse.    Blood pressure 124/76, pulse 98, temperature 98 F (36.7 C), temperature source Oral, resp. rate 20, SpO2 100%. There is no height or weight on file to calculate BMI.  Demographic Factors:  Male and Caucasian  Loss Factors: NA  Historical Factors: Impulsivity  Risk Reduction Factors:   NA  Continued Clinical Symptoms:  Alcohol/Substance Abuse/Dependencies  Cognitive Features That Contribute To Risk:  None    Suicide Risk:  Minimal: No identifiable suicidal ideation.  Patients presenting with no risk factors but with morbid ruminations; may be classified as minimal risk based on the severity of the depressive symptoms  Plan Of Care/Follow-up recommendations:  Activity:  As tolerated  Diet:  Regular  Disposition: Admission to [facility]  Randall Bouquet, NP 05/12/2024, 3:32 PM

## 2024-05-12 NOTE — Progress Notes (Addendum)
 At approximate 0800, staff was alerted by Ellouise, A FNP to a contraband that pt had hidden in his sock with part of it hanging out when she went to assess him. Pt took it out and then proceeded to place it his anal area. This clinical research associate with FNP and MHT asked pt to hand over the contraband and he complied. Contraband is a piece of patch looking substance that was wrapped in plastic. Pt said that it is Suboxone. Writer did body and materials engineer. No other contraband found. Contraband was discarded in the sharps container. Staff will monitor pt for safety.

## 2024-05-12 NOTE — ED Provider Notes (Signed)
 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  Transferred from [facility] emergency department to [facility] behavioral health continuous observation unit 05/11/2024.  Patient remains voluntary.  Patient is assessed by this nurse practitioner face-to-face.  Patient is reclined on recliner in [facility] behavioral health continuous observation unit upon my approach.   This provider visualized twisted clear plastic wrap protruding from patient's nonskid sock.  Patient visualized plastic wrap from sock then placing plastic wrap into his scrub pants.  Patient did hand plastic wrapped substance to attending RN with verbal encouragement from this provider, MHT and RN. Substance appeared to be wrapped in [place] paper.  Patient reports substance is Suboxone, purchased illicitly.  Patient denies additional illicit substances.  David Andrews is alert and oriented.  He is cooperative during assessment.  He presents with euthymic mood, congruent affect.  Patient states I am trying to go to detox, I have talked to [place] at [facility].  I was last at [facility] 2 years ago.  I am going to sign and go to prison for larceny on 06/16/2024.  I want to get detox before I go.  Patient is open to alternative residential substance use treatment if available.  David Andrews endorses history of polysubstance use disorder.  Most recent opioid use 4 days ago.  Most recent methamphetamine use 2 days ago.  Most  recent illicit benzodiazepine use 4 days ago.  Patient endorses symptoms of opioid withdrawal including joint pain, nausea and anxiety.  Patient denies suicidal and homicidal ideation.  He endorses 2 previous suicide attempts.  Most recent suicide attempt 8 years ago when he ingested an intentional overdose.  Denies history of nonsuicidal self-harm.  Denies history of auditory and visual hallucinations.  There is no evidence of delusional thought content no indication that patient is responding to internal stimuli.  David Andrews is not linked with outpatient psychiatry at this time.  No current medications to address mood.  Patient endorses history of multiple previous inpatient psychiatric hospitalizations.  Per chart review most recent inpatient psychiatric hospitalization, behavioral health December 2016.  No family mental health history reported.  Patient offered support and encouragement.  He agrees with plan for admission to [facility] behavioral health facility based crisis while awaiting residential substance use treatment if available.   PHQ 2-9:   Flowsheet Row ED from 05/11/2024 in [facility] Most recent reading at 05/11/2024  6:13 PM ED from 05/10/2024 in [facility] Emergency Department at [facility] Most recent reading at 05/10/2024  9:25 PM ED from 05/10/2024 in [facility] Emergency Department at [facility] Most recent reading at 05/10/2024  5:07 PM  C-SSRS RISK CATEGORY No Risk No Risk No Risk    Screenings    Flowsheet Row Most Recent Value  CIWA-Ar Total 1  COWS Total Score 3    Total Time spent with patient: 30 minutes  Musculoskeletal  Strength & Muscle Tone: within normal limits Gait & Station: normal Patient leans: N/A  Psychiatric Specialty Exam  Presentation General Appearance:  Appropriate for Environment; Casual  Eye Contact: Fair  Speech: Clear and Coherent; Normal Rate  Speech  Volume: Normal  Handedness: Right   Mood and Affect  Mood: Euthymic  Affect: Congruent   Thought Process  Thought Processes: Coherent; Goal Directed  Descriptions of Associations:Intact  Orientation:Full (Time, Place and Person)  Thought Content:Logical; WDL  Diagnosis of Schizophrenia or Schizoaffective disorder in past: No   Hallucinations:Hallucinations: Other (comment)  Ideas of Reference:None  Suicidal Thoughts:Suicidal Thoughts: No  Homicidal Thoughts:Homicidal Thoughts: No   Sensorium  Memory: Immediate Good; Recent Fair  Judgment: Poor  Insight: Shallow   Executive Functions  Concentration: Good  Attention Span: Good  Recall: Good  Fund of Knowledge: Good  Language: Good   Psychomotor Activity  Psychomotor Activity: Psychomotor Activity: Normal   Assets  Assets: Communication Skills; Desire for Improvement; Financial Resources/Insurance; Social Support; Resilience; Physical Health   Sleep  Sleep: Sleep: Fair   Nutritional Assessment (For OBS and FBC admissions only) Has the patient had a weight loss or gain of 10 pounds or more in the last 3 months?: No Has the patient had a decrease in food intake/or appetite?: No Does the patient have dental problems?: No Does the patient have eating habits or behaviors that may be indicators of an eating disorder including binging or inducing vomiting?: No Has the patient recently lost weight without trying?: 0 Has the patient been eating poorly because of a decreased appetite?: 0 Malnutrition Screening Tool Score: 0    Physical Exam Vitals and nursing note reviewed.  Constitutional:      Appearance: Normal appearance. He is well-developed and normal weight.  HENT:     Head: Normocephalic and atraumatic.     Nose: Nose normal.  Cardiovascular:     Rate and Rhythm: Normal rate.  Pulmonary:     Effort: Pulmonary effort is normal.  Musculoskeletal:        General: Normal  range of motion.     Cervical back: Normal range of motion.  Skin:    General: Skin is warm and dry.  Neurological:     Mental Status: He is alert and oriented to person, place, and time.  Psychiatric:        Attention and Perception: Attention and perception normal.        Mood and Affect: Mood and affect normal.        Speech: Speech normal.        Behavior: Behavior normal. Behavior is cooperative.        Thought Content: Thought content normal.        Cognition and Memory: Cognition and memory normal.    Review of Systems  Constitutional: Negative.   HENT: Negative.    Eyes: Negative.   Respiratory: Negative.    Cardiovascular: Negative.   Gastrointestinal: Negative.   Genitourinary: Negative.   Musculoskeletal: Negative.   Skin: Negative.   Neurological: Negative.   Psychiatric/Behavioral:  Positive for substance abuse.     Blood pressure 101/70, pulse 89, temperature 98.3 F (36.8 C), temperature source Oral, resp. rate 16, SpO2 99%. There is no height or weight on file to calculate BMI.  Past Psychiatric History: Generalized anxiety disorder, polysubstance dependence, bipolar affective disorder, opioid use with opioid induced mood disorder, alcohol abuse, opioid abuse  Is the patient at risk to self? No  Has the patient been a risk to self in the past 6 months? No .    Has the patient been a risk to self within the distant past? Yes  Is the patient a risk to others? No   Has the patient been a risk to others in the past 6 months? No   Has the patient been a risk to others within the distant past? No   Past Medical History: Chronic hepatitis C without hepatic coma, osteoarthritis of multiple joints Family History: None reported Social History: Patient is currently homeless.  Reports will be placed in jail and subsequently prison related to larceny charge on 06/16/2024.  Endorses history of opioid use disorder, most recent reported opioid use approximately 4 days,  endorses history of illicit benzodiazepine use, most recent reported illicit benzodiazepine use 4 days ago.  Endorses history of methamphetamine misuse, most recent reported stimulant use 2 days ago.  Last Labs:  Admission on 05/11/2024  Component Date Value Ref Range Status   POC Amphetamine UR 05/11/2024 Positive (A)  NONE DETECTED (Cut Off Level 1000 ng/mL) Final   POC Secobarbital (BAR) 05/11/2024 None Detected  NONE DETECTED (Cut Off Level 300 ng/mL) Final   POC Buprenorphine (BUP) 05/11/2024 Positive (A)  NONE DETECTED (Cut Off Level 10 ng/mL) Final   POC Oxazepam (BZO) 05/11/2024 Positive (A)  NONE DETECTED (Cut Off Level 300 ng/mL) Final   POC Cocaine UR 05/11/2024 Positive (A)  NONE DETECTED (Cut Off Level 300 ng/mL) Final   POC Methamphetamine UR 05/11/2024 Positive (A)  NONE DETECTED (Cut Off Level 1000 ng/mL) Final   POC Morphine 05/11/2024 None Detected  NONE DETECTED (Cut Off Level 300 ng/mL) Final   POC Methadone  UR 05/11/2024 None Detected  NONE DETECTED (Cut Off Level 300 ng/mL) Final   POC Oxycodone UR 05/11/2024 None Detected  NONE DETECTED (Cut Off Level 100 ng/mL) Final   POC Marijuana UR 05/11/2024 Positive (A)  NONE DETECTED (Cut Off Level 50 ng/mL) Final  Admission on 05/10/2024, Discharged on 05/11/2024  Component Date Value Ref Range Status   Sodium 05/10/2024 136  135 - 145 mmol/L Final   Potassium 05/10/2024 3.5  3.5 - 5.1 mmol/L Final   Chloride 05/10/2024 101  98 - 111 mmol/L Final   CO2 05/10/2024 23  22 - 32 mmol/L Final   Glucose, Bld 05/10/2024 76  70 - 99 mg/dL Final   Glucose reference range applies only to samples taken after fasting for at least 8 hours.   BUN 05/10/2024 8  6 - 20 mg/dL Final   Creatinine, Ser 05/10/2024 0.76  0.61 - 1.24 mg/dL Final   Calcium  05/10/2024 9.3  8.9 - 10.3 mg/dL Final   Total Protein 88/90/7974 6.8  6.5 - 8.1 g/dL Final   Albumin 88/90/7974 3.5  3.5 - 5.0 g/dL Final   AST 88/90/7974 33  15 - 41 U/L Final   ALT  05/10/2024 46 (H)  0 - 44 U/L Final   Alkaline Phosphatase 05/10/2024 81  38 - 126 U/L Final   Total Bilirubin 05/10/2024 0.3  0.0 - 1.2 mg/dL Final   GFR, Estimated 05/10/2024 >60  >60 mL/min Final   Comment: (NOTE) Calculated using the CKD-EPI Creatinine Equation (2021)    Anion gap 05/10/2024 12  5 - 15 Final   Performed at [facility], [address] [place], [place] [address]   Alcohol, Ethyl (B) 05/10/2024 <15  <15 mg/dL Final   Comment: (NOTE) For medical purposes only. Performed at [facility], [address], [place], [place] [address]    WBC 05/10/2024 8.0  4.0 - 10.5 K/uL Final   RBC 05/10/2024 4.74  4.22 -  5.81 MIL/uL Final   Hemoglobin 05/10/2024 13.5  13.0 - 17.0 g/dL Final   HCT 88/90/7974 40.7  39.0 - 52.0 % Final   MCV 05/10/2024 85.9  80.0 - 100.0 fL Final   MCH 05/10/2024 28.5  26.0 - 34.0 pg Final   MCHC 05/10/2024 33.2  30.0 - 36.0 g/dL Final   RDW 88/90/7974 12.9  11.5 - 15.5 % Final   Platelets 05/10/2024 352  150 - 400 K/uL Final   nRBC 05/10/2024 0.0  0.0 - 0.2 % Final   Performed at [facility], [address] [place], [place] [address]   Opiates 05/10/2024 NONE DETECTED  NONE DETECTED Final   Cocaine 05/10/2024 POSITIVE (A)  NONE DETECTED Final   Benzodiazepines 05/10/2024 NONE DETECTED  NONE DETECTED Final   Amphetamines 05/10/2024 POSITIVE (A)  NONE DETECTED Final   Comment: (NOTE) Trazodone  is metabolized in vivo to several metabolites, including pharmacologically active m-CPP, which is excreted in the urine. Immunoassay screens for amphetamines and MDMA have potential cross-reactivity with these compounds and may provide false positive  results.     Tetrahydrocannabinol 05/10/2024 NONE DETECTED  NONE DETECTED Final   Barbiturates 05/10/2024 NONE DETECTED  NONE DETECTED Final   Comment: (NOTE) DRUG SCREEN FOR MEDICAL PURPOSES ONLY.  IF CONFIRMATION IS NEEDED FOR ANY PURPOSE, NOTIFY LAB WITHIN 5 DAYS.  LOWEST DETECTABLE  LIMITS FOR URINE DRUG SCREEN Drug Class                     Cutoff (ng/mL) Amphetamine and metabolites    1000 Barbiturate and metabolites    200 Benzodiazepine                 200 Opiates and metabolites        300 Cocaine and metabolites        300 THC                            50 Performed at [facility], [address], [place], [place] [address]   Admission on 12/27/2023, Discharged on 12/27/2023  Component Date Value Ref Range Status   Sodium 12/27/2023 137  135 - 145 mmol/L Final   Potassium 12/27/2023 3.7  3.5 - 5.1 mmol/L Final   Chloride 12/27/2023 102  98 - 111 mmol/L Final   CO2 12/27/2023 22  22 - 32 mmol/L Final   Glucose, Bld 12/27/2023 90  70 - 99 mg/dL Final   Glucose reference range applies only to samples taken after fasting for at least 8 hours.   BUN 12/27/2023 8  6 - 20 mg/dL Final   Creatinine, Ser 12/27/2023 0.78  0.61 - 1.24 mg/dL Final   Calcium  12/27/2023 8.8 (L)  8.9 - 10.3 mg/dL Final   GFR, Estimated 12/27/2023 >60  >60 mL/min Final   Comment: (NOTE) Calculated using the CKD-EPI Creatinine Equation (2021)    Anion gap 12/27/2023 13  5 - 15 Final   Performed at [facility], [address], [place], [place] [address]   WBC 12/27/2023 6.8  4.0 - 10.5 K/uL Final   RBC 12/27/2023 4.48  4.22 - 5.81 MIL/uL Final   Hemoglobin 12/27/2023 13.4  13.0 - 17.0 g/dL Final   HCT 93/72/7974 40.2  39.0 - 52.0 % Final   MCV 12/27/2023 89.7  80.0 - 100.0 fL Final   MCH 12/27/2023 29.9  26.0 - 34.0 pg Final  MCHC 12/27/2023 33.3  30.0 - 36.0 g/dL Final   RDW 93/72/7974 12.8  11.5 - 15.5 % Final   Platelets 12/27/2023 332  150 - 400 K/uL Final   nRBC 12/27/2023 0.0  0.0 - 0.2 % Final   Performed at [facility], [address] [place], [place] [address]  Admission on 11/29/2023, Discharged on 11/29/2023  Component Date Value Ref Range Status   Color, Urine 11/29/2023 AMBER (A)  YELLOW Final   BIOCHEMICALS MAY BE AFFECTED BY COLOR   APPearance  11/29/2023 CLEAR  CLEAR Final   Specific Gravity, Urine 11/29/2023 1.021  1.005 - 1.030 Final   pH 11/29/2023 5.0  5.0 - 8.0 Final   Glucose, UA 11/29/2023 NEGATIVE  NEGATIVE mg/dL Final   Hgb urine dipstick 11/29/2023 NEGATIVE  NEGATIVE Final   Bilirubin Urine 11/29/2023 NEGATIVE  NEGATIVE Final   Ketones, ur 11/29/2023 NEGATIVE  NEGATIVE mg/dL Final   Protein, ur 94/69/7974 30 (A)  NEGATIVE mg/dL Final   Nitrite 94/69/7974 NEGATIVE  NEGATIVE Final   Leukocytes,Ua 11/29/2023 NEGATIVE  NEGATIVE Final   RBC / HPF 11/29/2023 0-5  0 - 5 RBC/hpf Final   WBC, UA 11/29/2023 0-5  0 - 5 WBC/hpf Final   Bacteria, UA 11/29/2023 NONE SEEN  NONE SEEN Final   Squamous Epithelial / HPF 11/29/2023 0-5  0 - 5 /HPF Final   Mucus 11/29/2023 PRESENT   Final   Performed at [facility], [address] [place], [place] [address]   Sodium 11/29/2023 133 (L)  135 - 145 mmol/L Final   Potassium 11/29/2023 3.1 (L)  3.5 - 5.1 mmol/L Final   Chloride 11/29/2023 93 (L)  98 - 111 mmol/L Final   CO2 11/29/2023 30  22 - 32 mmol/L Final   Glucose, Bld 11/29/2023 99  70 - 99 mg/dL Final   Glucose reference range applies only to samples taken after fasting for at least 8 hours.   BUN 11/29/2023 10  6 - 20 mg/dL Final   Creatinine, Ser 11/29/2023 0.76  0.61 - 1.24 mg/dL Final   Calcium  11/29/2023 9.1  8.9 - 10.3 mg/dL Final   GFR, Estimated 11/29/2023 >60  >60 mL/min Final   Comment: (NOTE) Calculated using the CKD-EPI Creatinine Equation (2021)    Anion gap 11/29/2023 10  5 - 15 Final   Performed at [facility], [address], [place], [place] [address]   WBC 11/29/2023 7.2  4.0 - 10.5 K/uL Final   RBC 11/29/2023 5.05  4.22 - 5.81 MIL/uL Final   Hemoglobin 11/29/2023 15.5  13.0 - 17.0 g/dL Final   HCT 94/69/7974 44.7  39.0 - 52.0 % Final   MCV 11/29/2023 88.5  80.0 - 100.0 fL Final   MCH 11/29/2023 30.7  26.0 - 34.0 pg Final   MCHC 11/29/2023 34.7  30.0 - 36.0 g/dL Final    RDW 94/69/7974 11.9  11.5 - 15.5 % Final   Platelets 11/29/2023 292  150 - 400 K/uL Final   nRBC 11/29/2023 0.0  0.0 - 0.2 % Final   Performed at [facility], [address] [place], [place] [address]    Allergies: Mustard and Wellbutrin [bupropion]  Medications:  Facility Ordered Medications  Medication   [COMPLETED] acetaminophen  (TYLENOL ) tablet 1,000 mg   [COMPLETED] ibuprofen  (ADVIL ) tablet 600 mg   acetaminophen  (TYLENOL ) tablet 650 mg   alum & mag hydroxide-simeth (MAALOX/MYLANTA) 200-200-20 MG/5ML suspension 30 mL   magnesium  hydroxide (MILK OF  MAGNESIA) suspension 30 mL   haloperidol (HALDOL) tablet 5 mg   And   diphenhydrAMINE (BENADRYL) capsule 50 mg   haloperidol lactate (HALDOL) injection 5 mg   And   diphenhydrAMINE (BENADRYL) injection 50 mg   And   LORazepam  (ATIVAN ) injection 2 mg   haloperidol lactate (HALDOL) injection 10 mg   And   diphenhydrAMINE (BENADRYL) injection 50 mg   And   LORazepam  (ATIVAN ) injection 2 mg   diphenhydrAMINE (BENADRYL) capsule 50 mg   traZODone  (DESYREL ) tablet 50 mg   nicotine  (NICODERM CQ  - dosed in mg/24 hours) patch 21 mg   dicyclomine  (BENTYL ) tablet 20 mg   methocarbamol  (ROBAXIN ) tablet 500 mg   naproxen  (NAPROSYN ) tablet 500 mg   [COMPLETED] thiamine (VITAMIN B1) injection 100 mg   multivitamin with minerals tablet 1 tablet   chlordiazePOXIDE (LIBRIUM) capsule 25 mg   hydrOXYzine  (ATARAX ) tablet 25 mg   loperamide  (IMODIUM ) capsule 2-4 mg   ondansetron  (ZOFRAN -ODT) disintegrating tablet 4 mg   chlordiazePOXIDE (LIBRIUM) capsule 25 mg   Followed by   chlordiazePOXIDE (LIBRIUM) capsule 25 mg   Followed by   NOREEN ON 05/13/2024] chlordiazePOXIDE (LIBRIUM) capsule 25 mg   Followed by   NOREEN ON 05/14/2024] chlordiazePOXIDE (LIBRIUM) capsule 25 mg   cloNIDine  (CATAPRES ) tablet 0.1 mg   Followed by   NOREEN ON 05/13/2024] cloNIDine  (CATAPRES ) tablet 0.1 mg   Followed by   NOREEN ON 05/16/2024]  cloNIDine  (CATAPRES ) tablet 0.1 mg    Long Term Goals: Improvement in symptoms so as ready for discharge  Short Term Goals: Patient will verbalize feelings in meetings with treatment team members., Patient will attend at least of 50% of the groups daily., Pt will complete the PHQ9 on admission, day 3 and discharge., Patient will participate in completing the [place] Suicide Severity Rating Scale, Patient will score a low risk of violence for 24 hours prior to discharge, and Patient will take medications as prescribed daily.  Medical Decision Making  Patient remains voluntary.  Patient remains in continuous observation unit at [facility] health.  Patient will be admitted to facility based crisis unit once space available, likely later this date.  05/11/2024 UDS: Positive amphetamine, positive buprenorphine, positive oxazepam, positive cocaine, positive methamphetamine, positive cannabis  Current medications: -Acetaminophen  650 mg every 6 hours, as needed/mild pain -Diphenhydramine 50 mg p.o. every 6 hours as needed/itching or allergies -Maalox 30 mL oral every 4 hours, as needed/digestion -Hydroxyzine  25 mg 3 times daily as needed/anxiety -Magnesium  hydroxide 30 mL daily as needed/mild constipation -Trazodone  50 mg nightly, as needed/sleep -NicoDerm 21 mg transdermal patch daily/nicotine  withdrawal  Agitation protocol: MILD -Haloperidol 5 mg 3 times daily as needed mild agitation  -Diphenhydramine 50 mg p.o. 3 times daily as needed mild agitation  MODERATE -Haloperidol 5 mg IM 3 times daily as needed/moderate agitation -Diphenhydramine 50 mg IM 3 times daily as needed/moderate agitation -Lorazepam  2 mg IM 3 times daily as needed/moderate agitation  SEVERE -Haloperidol 10 mg IM 3 times daily as needed severe agitation -Diphenhydramine 50 mg IM 3 times daily as needed/severe agitation -Lorazepam  2 mg IM 3 times daily as needed/severe agitation  CIWA Librium protocol  initiated: -Loperamide  2 to 4 mg oral as needed/diarrhea or loose stools -Librium 1 mg 4 times daily x4 doses, 1 mg 3 times daily x3 doses, 1 mg 2 times daily x2 doses, 1 mg daily x1 dose -Librium 1 mg every 6 hours as needed CIWA greater than 10 -Multivitamin with minerals 1  tablet daily -Ondansetron  disintegrating tablet 4 mg every 6 as needed/nausea or vomiting -Thiamine tablet 100 mg daily  Clonidine  Detox protocol initiated: -Clonidine  0.1 mg 4 daily x10 doses, clonidine  0.1 mg every morning and nightly x4 doses, clonidine  0.1 mg daily before breakfast x2 doses -Dicyclomine  20 mg every 6 hours as needed/spasms or abdominal cramping -Methocarbamol  500 mg every 8 hours as needed/muscle spasms -Naproxen  500 mg twice daily as needed/aching, pain or discomfort -Ondansetron  disintegrating tablet 4mg  every 8 hours as needed/nausea or vomiting     Recommendations  Based on my evaluation the patient does not appear to have an emergency medical condition.  Ellouise LITTIE Dawn, FNP 05/12/24  8:55 AM

## 2024-05-12 NOTE — Group Note (Signed)
 Group Topic: Social Support  Group Date: 05/12/2024 Start Time: 2000 End Time: 2030 Facilitators: Joan Plowman B  Department: [facility]  Number of Participants: 9  Group Focus: check in and social skills Treatment Modality:  Individual Therapy and Patient-Centered Therapy Interventions utilized were support Purpose: express feelings  Name: David Andrews Date of Birth: 09-10-1990  MR: [id number]    Level of Participation: active Quality of Participation: attentive and cooperative Interactions with others: gave feedback Mood/Affect: appropriate Triggers (if applicable): NA Cognition: coherent/clear Progress: Gaining insight Response: NA Plan: patient will be encouraged to keep going to groups  Patients Problems:  Patient Active Problem List   Diagnosis Date Noted   Opioid use disorder 05/12/2024   Polysubstance abuse (HCC) 05/12/2024   Opioid abuse (HCC) 08/08/2021   Smoking 07/18/2021   Need for hepatitis B screening test 07/18/2021   Need for hepatitis A immunization 07/18/2021   Bipolar affective disorder in remission 06/28/2021   Chronic hepatitis C without hepatic coma (HCC) 06/28/2021   Opioid abuse with opioid-induced mood disorder (HCC) 06/28/2021   Alcohol abuse 06/28/2021   Osteoarthritis of multiple joints 06/28/2021   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

## 2024-05-12 NOTE — Progress Notes (Signed)
 Pt is transferred [facility].

## 2024-05-12 NOTE — Progress Notes (Signed)
 Pt is currently asleep. Pt complained of back pain but decline intervention when offered. No signs of acute distress noted. Administered scheduled meds per order. Pt denies current SI/HI/AVH, plan or intent. Staff will monitor for pt's safety.

## 2024-05-12 NOTE — Group Note (Signed)
 Group Topic: Healthy Self Image and Positive Change  Group Date: 05/12/2024 Start Time: 1600 End Time: 1645 Facilitators: Elnor Keven SAILOR  Department: [facility]  Number of Participants: 9  Group Focus: affirmation Treatment Modality:  Psychoeducation Interventions utilized were support Purpose: increase insight  Name: David Andrews Date of Birth: July 24, 1990  MR: [id number]    Level of Participation: Pt did not attend group Quality of Participation: NA Interactions with others: NA Mood/Affect: NA Triggers (if applicable): NA Cognition: NA Progress: None Response: NA Plan: follow-up needed  Patients Problems:  Patient Active Problem List   Diagnosis Date Noted   Opioid use disorder 05/12/2024   Polysubstance abuse (HCC) 05/12/2024   Opioid abuse (HCC) 08/08/2021   Smoking 07/18/2021   Need for hepatitis B screening test 07/18/2021   Need for hepatitis A immunization 07/18/2021   Bipolar affective disorder in remission 06/28/2021   Chronic hepatitis C without hepatic coma (HCC) 06/28/2021   Opioid abuse with opioid-induced mood disorder (HCC) 06/28/2021   Alcohol abuse 06/28/2021   Osteoarthritis of multiple joints 06/28/2021   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

## 2024-05-12 NOTE — ED Notes (Signed)
 Pt sleeping in bed, no acute distress noted. Respirations even and unlabored. Will continue to monitor for safety.

## 2024-05-12 NOTE — ED Notes (Signed)
 Pt resting in bed, no acute distress noted. Respirations even and unlabored. Will continue to monitor for safety.

## 2024-05-12 NOTE — ED Notes (Signed)
 Patient admitted for detox from meth, xanax and fentanyl.  Cia = 1 cows =4.  Patient cooperative with admission assessment.  Earlier today upon arrival patient ws found to have contraband in his sock.  He has been searched and no other contraband found.  Patient oriented to unit and shown to his room.  He denies avh shi or plan.

## 2024-05-13 ENCOUNTER — Encounter (HOSPITAL_COMMUNITY): Payer: Self-pay

## 2024-05-13 DIAGNOSIS — F1310 Sedative, hypnotic or anxiolytic abuse, uncomplicated: Secondary | ICD-10-CM | POA: Diagnosis not present

## 2024-05-13 DIAGNOSIS — F1123 Opioid dependence with withdrawal: Secondary | ICD-10-CM | POA: Diagnosis not present

## 2024-05-13 DIAGNOSIS — F1510 Other stimulant abuse, uncomplicated: Secondary | ICD-10-CM | POA: Diagnosis not present

## 2024-05-13 DIAGNOSIS — F1010 Alcohol abuse, uncomplicated: Secondary | ICD-10-CM | POA: Diagnosis not present

## 2024-05-13 NOTE — Group Note (Signed)
 Group Topic: Relaxation  Group Date: 05/13/2024 Start Time: 1430 End Time: 1500 Facilitators: Laneta Renea POUR, NT  Department: [facility]  Number of Participants: 7  Group Focus: problem solving and substance abuse education Treatment Modality:  Psychoeducation Interventions utilized were patient education and problem solving Purpose: enhance coping skills and increase insight  Name: David Andrews Date of Birth: [birth year]  MR: [id number]    Level of Participation: active Quality of Participation: motivated Interactions with others: gave feedback Mood/Affect: appropriate and positive Triggers (if applicable): None Cognition: coherent/clear and insightful Progress: Gaining insight Response:  I am feeling positive.  Plan: patient will be encouraged to attend group.   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

## 2024-05-13 NOTE — ED Notes (Signed)
 Patient remains on unit with no acute changes noted at this time. Alert and oriented x3. Interacting with peers/staff appropriately. No current suicidal or homicidal ideation reported. No hallucinations or delusions observed.Patient is tolerating the milieu. Vitals within normal limits. Medications administered as ordered; no adverse effects noted. Safety maintained

## 2024-05-13 NOTE — Group Note (Signed)
 Group Topic: Positive Affirmations  Group Date: 05/13/2024 Start Time: 1200 End Time: 1230 Facilitators: Reinhold Minor BROCKS, RN  Department: [facility]  Number of Participants: 9  Group Focus: acceptance Treatment Modality:  Psychoeducation Interventions utilized were group exercise Purpose: regain self-worth  Name: Aquila Delaughter Date of Birth: 07-01-1991  MR: [id number]    Level of Participation: active Quality of Participation: cooperative and offered feedback Interactions with others: gave feedback Mood/Affect: positive Triggers (if applicable): na Cognition: goal directed Progress: Moderate Response: pt spoke positively about self Plan: patient will be encouraged to keep speaking positively about self  Patients Problems:  Patient Active Problem List   Diagnosis Date Noted   Opioid use disorder 05/12/2024   Polysubstance abuse (HCC) 05/12/2024   Opioid abuse (HCC) 08/08/2021   Smoking 07/18/2021   Need for hepatitis B screening test 07/18/2021   Need for hepatitis A immunization 07/18/2021   Bipolar affective disorder in remission 06/28/2021   Chronic hepatitis C without hepatic coma (HCC) 06/28/2021   Opioid abuse with opioid-induced mood disorder (HCC) 06/28/2021   Alcohol abuse 06/28/2021   Osteoarthritis of multiple joints 06/28/2021   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

## 2024-05-13 NOTE — ED Notes (Signed)
 Patient alert, Mood appears calm. Respirations even and unlabored. No acute distress observed. Environment secured. Poc ongoing

## 2024-05-13 NOTE — ED Provider Notes (Signed)
 Behavioral Health Progress Note  Date and Time: 05/13/2024 1:07 PM Name: David Andrews MRN:  [id number]  David Andrews is a 33 year old male 05/11/2024.  Patient reportedly looking for detox upon arrival. Patient transitioned to [facility] behavioral health facility based crisis unit on 05/12/2024.  Patient ultimately would like admission to residential substance use treatment at [facility].  Patient specifically requests [facility] as he has been directed by his legal team to seek treatment at day [facility] for ongoing polysubstance use disorder.  Patient is assessed by this nurse practitioner face-to-face.  He is reclined in patient room, no apparent distress.  He is alert and oriented, pleasant and cooperative during assessment.  Patient resting with eyes closed upon my approach, appears asleep.  Patient is easily awakened.  Patient endorses anxious mood, congruent affect.  David Andrews endorses compliance with medications.  He endorses average appetite, has attended meals.  David Andrews has attended group meetings sessions.  Reportedly pleasant and cooperative with staff and peers.  Patient continues to deny suicidal and homicidal ideation.  He denies auditory and visual hallucination.  There is no evidence of delusional thought content no indication that patient is responding to internal stimuli.  David Andrews continues to endorse anxiety and generalized joint pain related to withdrawal.  History of benzodiazepine, opioid and alcohol use disorders.  No medication changes initiated today.  Patient denies complaints aside from generalized joint pain and anxiety.  Patient encouraged to request hydroxyzine  as needed for ongoing anxiety.  Reviewed potential side effects and offered patient opportunity to ask questions.  He verbalized understanding and agreement with plan.  Subjective: Patient states I feel anxious, it is because of my benzodiazepine withdrawal.  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  He has legal concerns including [facility]/16/2024.  Additional Social History:                         Sleep: Good  Appetite:  Fair  Current Medications:  Current Facility-Administered Medications  Medication Dose Route Frequency Provider Last Rate Last Admin   acetaminophen  (TYLENOL ) tablet 650 mg  650 mg Oral Q6H PRN Dasie Ellouise CROME, FNP       alum & mag hydroxide-simeth (MAALOX/MYLANTA) 200-200-20 MG/5ML suspension 30 mL  30 mL Oral Q4H PRN Kimberlly Norgard L, FNP       chlordiazePOXIDE (LIBRIUM) capsule 25 mg  25 mg Oral Q6H PRN Tram Wrenn L, FNP       chlordiazePOXIDE (LIBRIUM) capsule 25 mg  25 mg Oral TID Avyukt Cimo L, FNP   25 mg at 05/13/24 9044   Followed by   NOREEN ON 05/14/2024] chlordiazePOXIDE (LIBRIUM) capsule 25 mg  25 mg Oral Letha Dasie Ellouise CROME, FNP       Followed by   NOREEN ON 05/15/2024] chlordiazePOXIDE (LIBRIUM) capsule 25 mg  25 mg Oral Daily Dasie Ellouise CROME, FNP       cloNIDine  (CATAPRES ) tablet 0.1 mg  0.1 mg Oral QID Dasie Ellouise CROME, FNP   0.1 mg at 05/13/24 0956   Followed by   [  START ON 05/14/2024] cloNIDine  (CATAPRES ) tablet 0.1 mg  0.1 mg Oral BH-qamhs Dasie Ellouise CROME, FNP       Followed by   NOREEN ON 05/16/2024] cloNIDine  (CATAPRES ) tablet 0.1 mg  0.1 mg Oral QAC breakfast Dasie Ellouise CROME, FNP       dicyclomine  (BENTYL ) tablet 20 mg  20 mg Oral Q6H PRN Davonne Baby L, FNP   20 mg at 05/12/24 1719   diphenhydrAMINE  (BENADRYL) capsule 50 mg  50 mg Oral Q6H PRN Zaliah Wissner L, FNP       haloperidol (HALDOL) tablet 5 mg  5 mg Oral TID PRN Karan Inclan L, FNP       And   diphenhydrAMINE (BENADRYL) capsule 50 mg  50 mg Oral TID PRN Aaira Oestreicher L, FNP       haloperidol (HALDOL) tablet 5 mg  5 mg Oral TID PRN [name], NP       And   diphenhydrAMINE (BENADRYL) capsule 50 mg  50 mg Oral TID PRN [name], [name], NP       haloperidol lactate (HALDOL) injection 5 mg  5 mg Intramuscular TID PRN Ferlin Fairhurst L, FNP       And   diphenhydrAMINE (BENADRYL) injection 50 mg  50 mg Intramuscular TID PRN Dasie Ellouise CROME, FNP       And   LORazepam  (ATIVAN ) injection 2 mg  2 mg Intramuscular TID PRN Sivan Cuello L, FNP       haloperidol lactate (HALDOL) injection 10 mg  10 mg Intramuscular TID PRN Samentha Perham L, FNP       And   diphenhydrAMINE (BENADRYL) injection 50 mg  50 mg Intramuscular TID PRN Dasie Ellouise CROME, FNP       And   LORazepam  (ATIVAN ) injection 2 mg  2 mg Intramuscular TID PRN Jomaira Darr L, FNP       haloperidol lactate (HALDOL) injection 5 mg  5 mg Intramuscular TID PRN [name], [name], NP       And   diphenhydrAMINE (BENADRYL) injection 50 mg  50 mg Intramuscular TID PRN [name], NP       And   LORazepam  (ATIVAN ) injection 2 mg  2 mg Intramuscular TID PRN [name], [name], NP       haloperidol lactate (HALDOL) injection 10 mg  10 mg Intramuscular TID PRN [name], NP       And   diphenhydrAMINE (BENADRYL) injection 50 mg  50 mg Intramuscular TID PRN [name], NP       And   LORazepam  (ATIVAN ) injection 2 mg  2 mg Intramuscular TID PRN [name], [name], NP       hydrOXYzine  (ATARAX ) tablet 25 mg  25 mg Oral Q6H PRN Dasie Ellouise CROME, FNP   25 mg at 05/13/24 0820   loperamide  (IMODIUM ) capsule 2-4 mg  2-4 mg Oral PRN Dasie Ellouise CROME, FNP   2 mg at 05/12/24 1719   magnesium  hydroxide (MILK OF MAGNESIA) suspension 30 mL  30 mL Oral Daily PRN  Cataleyah Colborn L, FNP       methocarbamol  (ROBAXIN ) tablet 500 mg  500 mg Oral Q8H PRN Dasie Ellouise CROME, FNP   500 mg at 05/13/24 0820   multivitamin with minerals tablet 1 tablet  1 tablet Oral Daily Dasie Ellouise CROME, FNP   1 tablet at 05/13/24 9043   naproxen  (NAPROSYN ) tablet 500 mg  500 mg Oral BID PRN Dasie Ellouise  L, FNP   500 mg at 05/13/24 0820   nicotine  (NICODERM CQ  - dosed in mg/24 hours) patch 21 mg  21 mg Transdermal Q0600 Dasie Ellouise CROME, FNP   21 mg at 05/13/24 0820   ondansetron  (ZOFRAN -ODT) disintegrating tablet 4 mg  4 mg Oral Q6H PRN Dasie Ellouise CROME, FNP   4 mg at 05/12/24 1719   thiamine (VITAMIN B1) tablet 100 mg  100 mg Oral Daily Dasie Ellouise CROME, FNP   100 mg at 05/13/24 9043   traZODone  (DESYREL ) tablet 50 mg  50 mg Oral QHS PRN Dasie Ellouise CROME, FNP   50 mg at 05/12/24 2210   No current outpatient medications on file.    Labs  Lab Results:  Admission on 05/11/2024, Discharged on 05/12/2024  Component Date Value Ref Range Status   POC Amphetamine UR 05/11/2024 Positive (A)  NONE DETECTED (Cut Off Level 1000 ng/mL) Final   POC Secobarbital (BAR) 05/11/2024 None Detected  NONE DETECTED (Cut Off Level 300 ng/mL) Final   POC Buprenorphine (BUP) 05/11/2024 Positive (A)  NONE DETECTED (Cut Off Level 10 ng/mL) Final   POC Oxazepam (BZO) 05/11/2024 Positive (A)  NONE DETECTED (Cut Off Level 300 ng/mL) Final   POC Cocaine UR 05/11/2024 Positive (A)  NONE DETECTED (Cut Off Level 300 ng/mL) Final   POC Methamphetamine UR 05/11/2024 Positive (A)  NONE DETECTED (Cut Off Level 1000 ng/mL) Final   POC Morphine 05/11/2024 None Detected  NONE DETECTED (Cut Off Level 300 ng/mL) Final   POC Methadone  UR 05/11/2024 None Detected  NONE DETECTED (Cut Off Level 300 ng/mL) Final   POC Oxycodone UR 05/11/2024 None Detected  NONE DETECTED (Cut Off Level 100 ng/mL) Final   POC Marijuana UR 05/11/2024 Positive (A)  NONE DETECTED (Cut Off Level 50 ng/mL) Final  Admission on 05/10/2024, Discharged on 05/11/2024   Component Date Value Ref Range Status   Sodium 05/10/2024 136  135 - 145 mmol/L Final   Potassium 05/10/2024 3.5  3.5 - 5.1 mmol/L Final   Chloride 05/10/2024 101  98 - 111 mmol/L Final   CO2 05/10/2024 23  22 - 32 mmol/L Final   Glucose, Bld 05/10/2024 76  70 - 99 mg/dL Final   Glucose reference range applies only to samples taken after fasting for at least 8 hours.   BUN 05/10/2024 8  6 - 20 mg/dL Final   Creatinine, Ser 05/10/2024 0.76  0.61 - 1.24 mg/dL Final   Calcium  05/10/2024 9.3  8.9 - 10.3 mg/dL Final   Total Protein 88/90/7974 6.8  6.5 - 8.1 g/dL Final   Albumin 88/90/7974 3.5  3.5 - 5.0 g/dL Final   AST 88/90/7974 33  15 - 41 U/L Final   ALT 05/10/2024 46 (H)  0 - 44 U/L Final   Alkaline Phosphatase 05/10/2024 81  38 - 126 U/L Final   Total Bilirubin 05/10/2024 0.3  0.0 - 1.2 mg/dL Final   GFR, Estimated 05/10/2024 >60  >60 mL/min Final   Comment: (NOTE) Calculated using the CKD-EPI Creatinine Equation (2021)    Anion gap 05/10/2024 12  5 - 15 Final   Performed at [facility], [address], [place], [place] [address]   Alcohol, Ethyl (B) 05/10/2024 <15  <15 mg/dL Final   Comment: (NOTE) For medical purposes only. Performed at [facility], [address], [place], [place] [address]    WBC 05/10/2024 8.0  4.0 - 10.5 K/uL Final   RBC 05/10/2024 4.74  4.22 -  5.81 MIL/uL Final   Hemoglobin 05/10/2024 13.5  13.0 - 17.0 g/dL Final   HCT 88/90/7974 40.7  39.0 - 52.0 % Final   MCV 05/10/2024 85.9  80.0 - 100.0 fL Final   MCH 05/10/2024 28.5  26.0 - 34.0 pg Final   MCHC 05/10/2024 33.2  30.0 - 36.0 g/dL Final   RDW 88/90/7974 12.9  11.5 - 15.5 % Final   Platelets 05/10/2024 352  150 - 400 K/uL Final   nRBC 05/10/2024 0.0  0.0 - 0.2 % Final   Performed at [facility], [address] [place], [place] [address]   Opiates 05/10/2024 NONE DETECTED  NONE DETECTED Final   Cocaine 05/10/2024 POSITIVE (A)  NONE DETECTED Final   Benzodiazepines 05/10/2024  NONE DETECTED  NONE DETECTED Final   Amphetamines 05/10/2024 POSITIVE (A)  NONE DETECTED Final   Comment: (NOTE) Trazodone  is metabolized in vivo to several metabolites, including pharmacologically active m-CPP, which is excreted in the urine. Immunoassay screens for amphetamines and MDMA have potential cross-reactivity with these compounds and may provide false positive  results.     Tetrahydrocannabinol 05/10/2024 NONE DETECTED  NONE DETECTED Final   Barbiturates 05/10/2024 NONE DETECTED  NONE DETECTED Final   Comment: (NOTE) DRUG SCREEN FOR MEDICAL PURPOSES ONLY.  IF CONFIRMATION IS NEEDED FOR ANY PURPOSE, NOTIFY LAB WITHIN 5 DAYS.  LOWEST DETECTABLE LIMITS FOR URINE DRUG SCREEN Drug Class                     Cutoff (ng/mL) Amphetamine and metabolites    1000 Barbiturate and metabolites    200 Benzodiazepine                 200 Opiates and metabolites        300 Cocaine and metabolites        300 THC                            50 Performed at [facility], [address] [place], [place] [address]   Admission on 12/27/2023, Discharged on 12/27/2023  Component Date Value Ref Range Status   Sodium 12/27/2023 137  135 - 145 mmol/L Final   Potassium 12/27/2023 3.7  3.5 - 5.1 mmol/L Final   Chloride 12/27/2023 102  98 - 111 mmol/L Final   CO2 12/27/2023 22  22 - 32 mmol/L Final   Glucose, Bld 12/27/2023 90  70 - 99 mg/dL Final   Glucose reference range applies only to samples taken after fasting for at least 8 hours.   BUN 12/27/2023 8  6 - 20 mg/dL Final   Creatinine, Ser 12/27/2023 0.78  0.61 - 1.24 mg/dL Final   Calcium  12/27/2023 8.8 (L)  8.9 - 10.3 mg/dL Final   GFR, Estimated 12/27/2023 >60  >60 mL/min Final   Comment: (NOTE) Calculated using the CKD-EPI Creatinine Equation (2021)    Anion gap 12/27/2023 13  5 - 15 Final   Performed at [facility], [address] [place], [place] [address]   WBC 12/27/2023 6.8  4.0 - 10.5 K/uL Final   RBC 12/27/2023  4.48  4.22 - 5.81 MIL/uL Final   Hemoglobin 12/27/2023 13.4  13.0 - 17.0 g/dL Final   HCT 93/72/7974 40.2  39.0 - 52.0 % Final   MCV 12/27/2023 89.7  80.0 - 100.0 fL Final   MCH 12/27/2023 29.9  26.0 - 34.0 pg Final  MCHC 12/27/2023 33.3  30.0 - 36.0 g/dL Final   RDW 93/72/7974 12.8  11.5 - 15.5 % Final   Platelets 12/27/2023 332  150 - 400 K/uL Final   nRBC 12/27/2023 0.0  0.0 - 0.2 % Final   Performed at [facility], [address] [place], [place] [address]  Admission on 11/29/2023, Discharged on 11/29/2023  Component Date Value Ref Range Status   Color, Urine 11/29/2023 AMBER (A)  YELLOW Final   BIOCHEMICALS MAY BE AFFECTED BY COLOR   APPearance 11/29/2023 CLEAR  CLEAR Final   Specific Gravity, Urine 11/29/2023 1.021  1.005 - 1.030 Final   pH 11/29/2023 5.0  5.0 - 8.0 Final   Glucose, UA 11/29/2023 NEGATIVE  NEGATIVE mg/dL Final   Hgb urine dipstick 11/29/2023 NEGATIVE  NEGATIVE Final   Bilirubin Urine 11/29/2023 NEGATIVE  NEGATIVE Final   Ketones, ur 11/29/2023 NEGATIVE  NEGATIVE mg/dL Final   Protein, ur 94/69/7974 30 (A)  NEGATIVE mg/dL Final   Nitrite 94/69/7974 NEGATIVE  NEGATIVE Final   Leukocytes,Ua 11/29/2023 NEGATIVE  NEGATIVE Final   RBC / HPF 11/29/2023 0-5  0 - 5 RBC/hpf Final   WBC, UA 11/29/2023 0-5  0 - 5 WBC/hpf Final   Bacteria, UA 11/29/2023 NONE SEEN  NONE SEEN Final   Squamous Epithelial / HPF 11/29/2023 0-5  0 - 5 /HPF Final   Mucus 11/29/2023 PRESENT   Final   Performed at [facility], [address], [place], [place] [address]   Sodium 11/29/2023 133 (L)  135 - 145 mmol/L Final   Potassium 11/29/2023 3.1 (L)  3.5 - 5.1 mmol/L Final   Chloride 11/29/2023 93 (L)  98 - 111 mmol/L Final   CO2 11/29/2023 30  22 - 32 mmol/L Final   Glucose, Bld 11/29/2023 99  70 - 99 mg/dL Final   Glucose reference range applies only to samples taken after fasting for at least 8 hours.   BUN 11/29/2023 10  6 - 20 mg/dL Final   Creatinine, Ser  11/29/2023 0.76  0.61 - 1.24 mg/dL Final   Calcium  11/29/2023 9.1  8.9 - 10.3 mg/dL Final   GFR, Estimated 11/29/2023 >60  >60 mL/min Final   Comment: (NOTE) Calculated using the CKD-EPI Creatinine Equation (2021)    Anion gap 11/29/2023 10  5 - 15 Final   Performed at [facility], [address] [place], [place] [address]   WBC 11/29/2023 7.2  4.0 - 10.5 K/uL Final   RBC 11/29/2023 5.05  4.22 - 5.81 MIL/uL Final   Hemoglobin 11/29/2023 15.5  13.0 - 17.0 g/dL Final   HCT 94/69/7974 44.7  39.0 - 52.0 % Final   MCV 11/29/2023 88.5  80.0 - 100.0 fL Final   MCH 11/29/2023 30.7  26.0 - 34.0 pg Final   MCHC 11/29/2023 34.7  30.0 - 36.0 g/dL Final   RDW 94/69/7974 11.9  11.5 - 15.5 % Final   Platelets 11/29/2023 292  150 - 400 K/uL Final   nRBC 11/29/2023 0.0  0.0 - 0.2 % Final   Performed at [facility], [address] [place], [place] [address]    Blood Alcohol level:  Lab Results  Component Value Date   [facility] <15 05/10/2024   ETH <5 03/24/2016    Metabolic Disorder Labs: No results found for: HGBA1C, MPG No results found for: PROLACTIN Lab Results  Component Value Date   CHOL 187 06/27/2021   TRIG 213 (H) 06/27/2021  HDL 44 06/27/2021   CHOLHDL 4.3 06/27/2021   LDLCALC 106 (H) 06/27/2021    Therapeutic Lab Levels: No results found for: LITHIUM No results found for: VALPROATE No results found for: CBMZ  Physical Findings   AIMS    Flowsheet Row Admission (Discharged) from OP Visit from 06/30/2015 in BE[facility]AVIORAL HEALTH OBSERVATION UNIT  AIMS Total Score 0   AUDIT    Flowsheet Row Admission (Discharged) from OP Visit from 06/30/2015 in BEHAVIORAL HEALTH OBSERVATION UNIT  Alcohol Use Disorder Identification Test Final Score (AUDIT) 0   PHQ2-9    Flowsheet Row ED from 05/12/2024 in [facility] ED from 05/11/2024 in [facility] Office Visit from 08/15/2021 in  [facility] Office Visit from 07/18/2021 in [facility] - A Dept Of . [facility]  PHQ-2 Total Score 2 2 0 0  PHQ-9 Total Score 6 6 -- --   Flowsheet Row ED from 05/12/2024 in [facility] ED from 05/11/2024 in [facility] ED from 05/10/2024 in [facility] Emergency Department at [facility]  C-SSRS RISK CATEGORY No Risk No Risk No Risk     Musculoskeletal  Strength & Muscle Tone: within normal limits Gait & Station: normal Patient leans: N/A  Psychiatric Specialty Exam  Presentation  General Appearance:  Appropriate for Environment; Casual  Eye Contact: Minimal  Speech: Clear and Coherent; Normal Rate  Speech Volume: Normal  Handedness: Right   Mood and Affect  Mood: Anxious  Affect: Congruent   Thought Process  Thought Processes: Coherent; Goal Directed; Linear  Descriptions of Associations:Intact  Orientation:Full (Time, Place and Person)  Thought Content:Logical; WDL  Diagnosis of Schizophrenia or Schizoaffective disorder in past: No    Hallucinations:Hallucinations: None  Ideas of Reference:None  Suicidal Thoughts:Suicidal Thoughts: No  Homicidal Thoughts:Homicidal Thoughts: No   Sensorium  Memory: Immediate Good; Recent Good  Judgment: Fair  Insight: Fair   Art Therapist  Concentration: Good  Attention Span: Good  Recall: Good  Fund of Knowledge: Good  Language: Good   Psychomotor Activity  Psychomotor Activity: Psychomotor Activity: Normal   Assets  Assets: Communication Skills; Desire for Improvement; Financial Resources/Insurance; Social Support; Resilience   Sleep  Sleep: Sleep: Good  Estimated Sleeping Duration (Last 24 Hours): 9.50-11.75 hours (Due to Daylight Saving Time, the durations displayed may not accurately represent documentation  during the time change interval)  Nutritional Assessment (For OBS and [facility] admissions only) Has the patient had a weight loss or gain of 10 pounds or more in the last 3 months?: No Has the patient had a decrease in food intake/or appetite?: No Does the patient have dental problems?: No Does the patient have eating habits or behaviors that may be indicators of an eating disorder including binging or inducing vomiting?: No Has the patient recently lost weight without trying?: 0 Has the patient been eating poorly because of a decreased appetite?: 0 Malnutrition Screening Tool Score: 0    Physical Exam  Physical Exam Vitals and nursing note reviewed.  Constitutional:      Appearance: Normal appearance. He is well-developed.  HENT:     Head: Normocephalic and atraumatic.     Nose: Nose normal.  Cardiovascular:     Rate and Rhythm: Normal rate.  Pulmonary:     Effort: Pulmonary effort is normal.  Musculoskeletal:  General: Normal range of motion.  Skin:    General: Skin is warm and dry.  Neurological:     Mental Status: He is alert and oriented to person, place, and time.  Psychiatric:        Attention and Perception: Attention and perception normal.        Mood and Affect: Affect normal. Mood is anxious.        Speech: Speech normal.        Behavior: Behavior normal. Behavior is cooperative.        Thought Content: Thought content normal.        Cognition and Memory: Cognition normal.        Judgment: Judgment normal.    Review of Systems  Constitutional: Negative.   HENT: Negative.    Eyes: Negative.   Respiratory: Negative.    Cardiovascular: Negative.   Gastrointestinal: Negative.   Genitourinary: Negative.   Musculoskeletal: Negative.   Skin: Negative.   Neurological: Negative.   Psychiatric/Behavioral:  Positive for substance abuse. The patient is nervous/anxious.    Blood pressure 107/81, pulse 96, temperature 97.7 F (36.5 C), resp. rate 17, SpO2 99%.  There is no height or weight on file to calculate BMI.  Treatment Plan Summary: Daily contact with patient to assess and evaluate symptoms and progress in treatment Patient remains voluntary.  Current plan includes transition from [facility] behavioral health facility based crisis to residential substance use treatment.  Continue current medications including: Current medications: -Acetaminophen  650 mg every 6 hours, as needed/mild pain -Diphenhydramine 50 mg every 6 hours as needed/itching or allergies -Maalox 30 mL oral every 4 hours, as needed/digestion -Hydroxyzine  25 mg 3 times daily as needed/anxiety -Magnesium  hydroxide 30 mL daily as needed/mild constipation -Trazodone  50 mg nightly, as needed/sleep -NicoDerm 24 mg transdermal patch daily/nicotine  withdrawal  Agitation protocol: MILD -Haloperidol 5 mg 3 times daily as needed mild agitation  -Diphenhydramine 50 mg p.o. 3 times daily as needed mild agitation  MODERATE -Haloperidol 5 mg IM 3 times daily as needed/moderate agitation -Diphenhydramine 50 mg IM 3 times daily as needed/moderate agitation -Lorazepam  2 mg IM 3 times daily as needed/moderate agitation  SEVERE -Haloperidol 10 mg IM 3 times daily as needed severe agitation -Diphenhydramine 50 mg IM 3 times daily as needed/severe agitation -Lorazepam  2 mg IM 3 times daily as needed/severe agitation  CIWA Librium protocol, see MAR for details: -Loperamide  2 to 4 mg oral as needed/diarrhea or loose stools -Chlordiazepoxide 1 mg 4 times daily x4 doses, 1 mg 3 times daily x3 doses, 1 mg 2 times daily x2 doses, 1 mg daily x1 dose -Chlordiazepoxide 1 mg every 6 hours as needed CIWA greater than 10 -Multivitamin with minerals 1 tablet daily -Ondansetron  disintegrating tablet 4 mg every 6 as needed/nausea or vomiting -Thiamine tablet 100 mg daily  Clonidine  Detox protocol, see MAR for details: -Clonidine  0.1 mg 4 daily x10 doses, clonidine  0.1 mg every morning and  nightly x4 doses, clonidine  0.1 mg daily before breakfast x2 doses -Dicyclomine  20 mg every 6 hours as needed/spasms or abdominal cramping -Loperamide  2 to 4 mg oral as needed/diarrhea or loose stools -Methocarbamol  500 mg every 8 hours as needed/muscle spasms -Naproxen  500 mg twice daily as needed/aching, pain or discomfort -Ondansetron  disintegrating tablet 4mg  every 8 hours as needed/nausea or vomiting   Ellouise LITTIE Dawn, FNP 05/13/2024 1:07 PM

## 2024-05-13 NOTE — Care Management (Addendum)
 [facility] Care Management...   Addendum 2:45 pm  Patient met with harlene (PSS)  Addendum 1:43 pm  Patient has been accepted to [facility]  Patient has appointment to complete intake process on Monday 11/24/20225 at 9:00 am   Addendum 8:50 am... Per Olivia @ [facility] patient is under review   Writer met with the patient and discussed discharge planning.  Patient requests inpatient substance abuse treatment. Patient requested [facility]    Patient has been referred to [facility]

## 2024-05-13 NOTE — ED Notes (Signed)
 Pt sleeping in bed, no acute distress noted. Respirations even and unlabored. Will continue to monitor for safety.

## 2024-05-13 NOTE — ED Notes (Signed)
 Pt is sleeping, no acute distress noted.

## 2024-05-14 DIAGNOSIS — F1010 Alcohol abuse, uncomplicated: Secondary | ICD-10-CM | POA: Diagnosis not present

## 2024-05-14 DIAGNOSIS — F1310 Sedative, hypnotic or anxiolytic abuse, uncomplicated: Secondary | ICD-10-CM | POA: Diagnosis not present

## 2024-05-14 DIAGNOSIS — F1510 Other stimulant abuse, uncomplicated: Secondary | ICD-10-CM | POA: Diagnosis not present

## 2024-05-14 DIAGNOSIS — F1123 Opioid dependence with withdrawal: Secondary | ICD-10-CM | POA: Diagnosis not present

## 2024-05-14 MED ORDER — HYDROXYZINE HCL 25 MG PO TABS
50.0000 mg | ORAL_TABLET | Freq: Three times a day (TID) | ORAL | Status: DC | PRN
  Administered 2024-05-14 – 2024-05-17 (×8): 50 mg via ORAL
  Filled 2024-05-14 (×4): qty 2
  Filled 2024-05-14: qty 20
  Filled 2024-05-14 (×3): qty 2
  Filled 2024-05-14: qty 20
  Filled 2024-05-14 (×2): qty 2

## 2024-05-14 MED ORDER — PRAZOSIN HCL 1 MG PO CAPS
1.0000 mg | ORAL_CAPSULE | Freq: Once | ORAL | Status: AC
  Administered 2024-05-14: 1 mg via ORAL
  Filled 2024-05-14: qty 1

## 2024-05-14 MED ORDER — NICOTINE POLACRILEX 2 MG MT GUM
2.0000 mg | CHEWING_GUM | OROMUCOSAL | Status: DC | PRN
  Administered 2024-05-14 – 2024-05-17 (×11): 2 mg via ORAL
  Filled 2024-05-14 (×12): qty 1

## 2024-05-14 MED ORDER — CLONIDINE HCL 0.1 MG PO TABS
0.1000 mg | ORAL_TABLET | Freq: Every day | ORAL | Status: AC
  Administered 2024-05-16 – 2024-05-17 (×2): 0.1 mg via ORAL
  Filled 2024-05-14 (×2): qty 1

## 2024-05-14 MED ORDER — PRAZOSIN HCL 1 MG PO CAPS
1.0000 mg | ORAL_CAPSULE | Freq: Every day | ORAL | Status: DC
Start: 2024-05-14 — End: 2024-05-14

## 2024-05-14 MED ORDER — CLONIDINE HCL 0.1 MG PO TABS
0.1000 mg | ORAL_TABLET | ORAL | Status: AC
  Administered 2024-05-15 (×2): 0.1 mg via ORAL
  Filled 2024-05-14 (×2): qty 1

## 2024-05-14 NOTE — ED Provider Notes (Signed)
 Behavioral Health Progress Note  Date and Time: 05/14/2024 5:25 PM Name: David Andrews MRN:  [id number]   Subjective: David Andrews is a 33 year old male reportedly looking for detox upon arrival. Patient transitioned to [facility] behavioral health facility based crisis unit on 05/12/2024.  Patient ultimately would like admission to residential substance use treatment at [facility].  Patient specifically requests [facility] as he has been directed by his legal team to seek treatment at day [facility] for ongoing polysubstance use disorder. [name], is seen face to face by this provider, consulted with Dr. Lawrnce; and chart reviewed on 05/14/24.    On evaluation Ranier Coach reports that his sleep has been terrible due to having nightmares which have awake in the hallway due to him yelling at night.  Patient reports having a history of multiple different traumatic events which have led to him having these night terrors almost nightly.  Patient reports that trazodone  was ineffective at helping him sleep.  He reports having a good appetite.  Patient denies current suicidal ideations, homicidal ideations and psychotic symptoms.  Initially patient reports that he did not receive any type of taper for withdrawal symptoms.  Reminded patient that he was started on both the Librium for benzodiazepine withdrawal and clonidine  taper for opiate withdrawal symptoms.  Patient reports that the Librium taper has been ineffective at treating his symptoms and that he is still extremely anxious.  Patient is unable to provide any other physical withdrawal symptoms but continues to request Ativan  taper.  Educated patient that Librium is a longer acting acting benzodiazepine than Ativan  and has a longer coverage for symptom relief, his taper also is tomorrow morning and will not be restarting a new Ativan  taper for withdrawals.  Patient does not  objectively appear to be in withdrawal and only reports feeling anxious.   Discussed restarting hydroxyzine  50 mg 3 times daily as needed to target anxiety symptoms.  Also discussed starting Prazosin 1mg  at bedtime for night terrors. No changes to Librium taper. Pt reports plans to transfer to [facility] and is accepted on 05/25/2024.   During evaluation Alessandro Griep is sitting in dayroom, in no acute distress.  He is alert & oriented x 4, calm, cooperative and attentive for this assessment.  His mood is anxious with congruent affect.  He has normal speech, and behavior.  Objectively there is no evidence of psychosis/mania or delusional thinking. Pt does not appear to be responding to internal or external stimuli.  Patient is able to converse coherently, goal directed thoughts, no distractibility, or pre-occupation however is hyperfocused on medications with some concerns for medication seeking behaviors.  He denies suicidal/self-harm/homicidal ideation, psychosis, and paranoia.  Patient answered assessment question appropriately.        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  He has legal concerns including [facility].  Additional Social History:                         Sleep: Good  Appetite:  Fair  Current Medications:  Current Facility-Administered Medications  Medication Dose Route Frequency Provider Last Rate Last Admin   acetaminophen  (TYLENOL ) tablet 650 mg  650 mg Oral Q6H PRN Allen, Tina L, FNP        alum & mag hydroxide-simeth (MAALOX/MYLANTA) 200-200-20 MG/5ML suspension 30 mL  30 mL Oral Q4H PRN Allen, Tina L, FNP       chlordiazePOXIDE (LIBRIUM) capsule 25 mg  25 mg Oral Letha Dasie Ellouise LITTIE, FNP   25 mg at 05/14/24 9063   Followed by   NOREEN ON 05/15/2024] chlordiazePOXIDE (LIBRIUM) capsule 25 mg  25 mg Oral Daily Dasie Ellouise LITTIE, FNP       [START ON 05/15/2024] cloNIDine  (CATAPRES ) tablet 0.1 mg  0.1 mg Oral Letha Thresa Alan JAYSON, NP       Followed by   NOREEN ON 05/16/2024] cloNIDine  (CATAPRES ) tablet 0.1 mg  0.1 mg Oral QAC breakfast Raekwon Winkowski C, NP       dicyclomine  (BENTYL ) tablet 20 mg  20 mg Oral Q6H PRN Allen, Tina L, FNP   20 mg at 05/12/24 1719   diphenhydrAMINE (BENADRYL) capsule 50 mg  50 mg Oral Q6H PRN Allen, Tina L, FNP       haloperidol (HALDOL) tablet 5 mg  5 mg Oral TID PRN Allen, Tina L, FNP       And   diphenhydrAMINE (BENADRYL) capsule 50 mg  50 mg Oral TID PRN Allen, Tina L, FNP       haloperidol (HALDOL) tablet 5 mg  5 mg Oral TID PRN [name], [name], NP       And   diphenhydrAMINE (BENADRYL) capsule 50 mg  50 mg Oral TID PRN [name], [name], NP       haloperidol lactate (HALDOL) injection 5 mg  5 mg Intramuscular TID PRN Allen, Tina L, FNP       And   diphenhydrAMINE (BENADRYL) injection 50 mg  50 mg Intramuscular TID PRN Dasie Ellouise LITTIE, FNP       And   LORazepam  (ATIVAN ) injection 2 mg  2 mg Intramuscular TID PRN Allen, Tina L, FNP       haloperidol lactate (HALDOL) injection 10 mg  10 mg Intramuscular TID PRN Allen, Tina L, FNP       And   diphenhydrAMINE (BENADRYL) injection 50 mg  50 mg Intramuscular TID PRN Dasie Ellouise LITTIE, FNP       And   LORazepam  (ATIVAN ) injection 2 mg  2 mg Intramuscular TID PRN Allen, Tina L, FNP       haloperidol lactate (HALDOL) injection 5 mg  5 mg Intramuscular TID PRN [name], [name], NP       And   diphenhydrAMINE (BENADRYL) injection 50 mg  50 mg Intramuscular TID PRN [name], NP        And   LORazepam  (ATIVAN ) injection 2 mg  2 mg Intramuscular TID PRN [name], [name], NP       haloperidol lactate (HALDOL) injection 10 mg  10 mg Intramuscular TID PRN [name], [name], NP       And   diphenhydrAMINE (BENADRYL) injection 50 mg  50 mg Intramuscular TID PRN [name], NP       And   LORazepam  (ATIVAN ) injection 2 mg  2 mg Intramuscular TID PRN [name], [name], NP       hydrOXYzine  (ATARAX ) tablet 50 mg  50 mg Oral TID PRN Jacarri Gesner C, NP   50 mg at 05/14/24 1541  magnesium  hydroxide (MILK OF MAGNESIA) suspension 30 mL  30 mL Oral Daily PRN Allen, Tina L, FNP       methocarbamol  (ROBAXIN ) tablet 500 mg  500 mg Oral Q8H PRN Allen, Tina L, FNP   500 mg at 05/13/24 0820   multivitamin with minerals tablet 1 tablet  1 tablet Oral Daily Dasie Ellouise CROME, FNP   1 tablet at 05/14/24 9063   naproxen  (NAPROSYN ) tablet 500 mg  500 mg Oral BID PRN Allen, Tina L, FNP   500 mg at 05/13/24 0820   nicotine  (NICODERM CQ  - dosed in mg/24 hours) patch 21 mg  21 mg Transdermal Q0600 Dasie Ellouise CROME, FNP   21 mg at 05/14/24 9352   nicotine  polacrilex (NICORETTE) gum 2 mg  2 mg Oral Q2H PRN Jaylenn Altier C, NP   2 mg at 05/14/24 1443   prazosin (MINIPRESS) capsule 1 mg  1 mg Oral Once Bobbi Yount C, NP       thiamine (VITAMIN B1) tablet 100 mg  100 mg Oral Daily Allen, Tina L, FNP   100 mg at 05/14/24 9063   traZODone  (DESYREL ) tablet 50 mg  50 mg Oral QHS PRN Dasie Ellouise CROME, FNP   50 mg at 05/13/24 2147   No current outpatient medications on file.    Labs  Lab Results:  Admission on 05/11/2024, Discharged on 05/12/2024  Component Date Value Ref Range Status   POC Amphetamine UR 05/11/2024 Positive (A)  NONE DETECTED (Cut Off Level 1000 ng/mL) Final   POC Secobarbital (BAR) 05/11/2024 None Detected  NONE DETECTED (Cut Off Level 300 ng/mL) Final   POC Buprenorphine (BUP) 05/11/2024 Positive (A)  NONE DETECTED (Cut Off Level 10 ng/mL) Final   POC Oxazepam (BZO)  05/11/2024 Positive (A)  NONE DETECTED (Cut Off Level 300 ng/mL) Final   POC Cocaine UR 05/11/2024 Positive (A)  NONE DETECTED (Cut Off Level 300 ng/mL) Final   POC Methamphetamine UR 05/11/2024 Positive (A)  NONE DETECTED (Cut Off Level 1000 ng/mL) Final   POC Morphine 05/11/2024 None Detected  NONE DETECTED (Cut Off Level 300 ng/mL) Final   POC Methadone  UR 05/11/2024 None Detected  NONE DETECTED (Cut Off Level 300 ng/mL) Final   POC Oxycodone UR 05/11/2024 None Detected  NONE DETECTED (Cut Off Level 100 ng/mL) Final   POC Marijuana UR 05/11/2024 Positive (A)  NONE DETECTED (Cut Off Level 50 ng/mL) Final  Admission on 05/10/2024, Discharged on 05/11/2024  Component Date Value Ref Range Status   Sodium 05/10/2024 136  135 - 145 mmol/L Final   Potassium 05/10/2024 3.5  3.5 - 5.1 mmol/L Final   Chloride 05/10/2024 101  98 - 111 mmol/L Final   CO2 05/10/2024 23  22 - 32 mmol/L Final   Glucose, Bld 05/10/2024 76  70 - 99 mg/dL Final   Glucose reference range applies only to samples taken after fasting for at least 8 hours.   BUN 05/10/2024 8  6 - 20 mg/dL Final   Creatinine, Ser 05/10/2024 0.76  0.61 - 1.24 mg/dL Final   Calcium  05/10/2024 9.3  8.9 - 10.3 mg/dL Final   Total Protein 88/90/7974 6.8  6.5 - 8.1 g/dL Final   Albumin 88/90/7974 3.5  3.5 - 5.0 g/dL Final   AST 88/90/7974 33  15 - 41 U/L Final   ALT 05/10/2024 46 (H)  0 - 44 U/L Final   Alkaline Phosphatase 05/10/2024 81  38 - 126 U/L Final   Total Bilirubin 05/10/2024 0.3  0.0 - 1.2 mg/dL Final   GFR, Estimated 05/10/2024 >60  >60 mL/min Final   Comment: (NOTE) Calculated using the CKD-EPI Creatinine Equation (2021)    Anion gap 05/10/2024 12  5 - 15 Final   Performed at [facility], [address], [place], [place] [address]   Alcohol, Ethyl (B) 05/10/2024 <15  <15 mg/dL Final   Comment: (NOTE) For medical purposes only. Performed at [facility], [address], [place], [place] [address]    WBC 05/10/2024  8.0  4.0 - 10.5 K/uL Final   RBC 05/10/2024 4.74  4.22 - 5.81 MIL/uL Final   Hemoglobin 05/10/2024 13.5  13.0 - 17.0 g/dL Final   HCT 88/90/7974 40.7  39.0 - 52.0 % Final   MCV 05/10/2024 85.9  80.0 - 100.0 fL Final   MCH 05/10/2024 28.5  26.0 - 34.0 pg Final   MCHC 05/10/2024 33.2  30.0 - 36.0 g/dL Final   RDW 88/90/7974 12.9  11.5 - 15.5 % Final   Platelets 05/10/2024 352  150 - 400 K/uL Final   nRBC 05/10/2024 0.0  0.0 - 0.2 % Final   Performed at [facility], [address] [place], [place] [address]   Opiates 05/10/2024 NONE DETECTED  NONE DETECTED Final   Cocaine 05/10/2024 POSITIVE (A)  NONE DETECTED Final   Benzodiazepines 05/10/2024 NONE DETECTED  NONE DETECTED Final   Amphetamines 05/10/2024 POSITIVE (A)  NONE DETECTED Final   Comment: (NOTE) Trazodone  is metabolized in vivo to several metabolites, including pharmacologically active m-CPP, which is excreted in the urine. Immunoassay screens for amphetamines and MDMA have potential cross-reactivity with these compounds and may provide false positive  results.     Tetrahydrocannabinol 05/10/2024 NONE DETECTED  NONE DETECTED Final   Barbiturates 05/10/2024 NONE DETECTED  NONE DETECTED Final   Comment: (NOTE) DRUG SCREEN FOR MEDICAL PURPOSES ONLY.  IF CONFIRMATION IS NEEDED FOR ANY PURPOSE, NOTIFY LAB WITHIN 5 DAYS.  LOWEST DETECTABLE LIMITS FOR URINE DRUG SCREEN Drug Class                     Cutoff (ng/mL) Amphetamine and metabolites    1000 Barbiturate and metabolites    200 Benzodiazepine                 200 Opiates and metabolites        300 Cocaine and metabolites        300 THC                            50 Performed at [facility], [address], [place], [place] [address]   Admission on 12/27/2023, Discharged on 12/27/2023  Component Date Value Ref Range Status   Sodium 12/27/2023 137  135 - 145 mmol/L Final   Potassium 12/27/2023 3.7  3.5 - 5.1 mmol/L Final   Chloride 12/27/2023 102  98 -  111 mmol/L Final   CO2 12/27/2023 22  22 - 32 mmol/L Final   Glucose, Bld 12/27/2023 90  70 - 99 mg/dL Final   Glucose reference range applies only to samples taken after fasting for at least 8 hours.   BUN 12/27/2023 8  6 - 20 mg/dL Final   Creatinine, Ser 12/27/2023 0.78  0.61 - 1.24 mg/dL Final   Calcium  12/27/2023 8.8 (L)  8.9 - 10.3 mg/dL Final   GFR, Estimated 12/27/2023 >60  >60 mL/min Final  Comment: (NOTE) Calculated using the CKD-EPI Creatinine Equation (2021)    Anion gap 12/27/2023 13  5 - 15 Final   Performed at [facility], [address], [place], [place] [address]   WBC 12/27/2023 6.8  4.0 - 10.5 K/uL Final   RBC 12/27/2023 4.48  4.22 - 5.81 MIL/uL Final   Hemoglobin 12/27/2023 13.4  13.0 - 17.0 g/dL Final   HCT 93/72/7974 40.2  39.0 - 52.0 % Final   MCV 12/27/2023 89.7  80.0 - 100.0 fL Final   MCH 12/27/2023 29.9  26.0 - 34.0 pg Final   MCHC 12/27/2023 33.3  30.0 - 36.0 g/dL Final   RDW 93/72/7974 12.8  11.5 - 15.5 % Final   Platelets 12/27/2023 332  150 - 400 K/uL Final   nRBC 12/27/2023 0.0  0.0 - 0.2 % Final   Performed at [facility], [address], [place], [place] [address]  Admission on 11/29/2023, Discharged on 11/29/2023  Component Date Value Ref Range Status   Color, Urine 11/29/2023 AMBER (A)  YELLOW Final   BIOCHEMICALS MAY BE AFFECTED BY COLOR   APPearance 11/29/2023 CLEAR  CLEAR Final   Specific Gravity, Urine 11/29/2023 1.021  1.005 - 1.030 Final   pH 11/29/2023 5.0  5.0 - 8.0 Final   Glucose, UA 11/29/2023 NEGATIVE  NEGATIVE mg/dL Final   Hgb urine dipstick 11/29/2023 NEGATIVE  NEGATIVE Final   Bilirubin Urine 11/29/2023 NEGATIVE  NEGATIVE Final   Ketones, ur 11/29/2023 NEGATIVE  NEGATIVE mg/dL Final   Protein, ur 94/69/7974 30 (A)  NEGATIVE mg/dL Final   Nitrite 94/69/7974 NEGATIVE  NEGATIVE Final   Leukocytes,Ua 11/29/2023 NEGATIVE  NEGATIVE Final   RBC / HPF 11/29/2023 0-5  0 - 5 RBC/hpf Final   WBC, UA 11/29/2023 0-5  0 - 5  WBC/hpf Final   Bacteria, UA 11/29/2023 NONE SEEN  NONE SEEN Final   Squamous Epithelial / HPF 11/29/2023 0-5  0 - 5 /HPF Final   Mucus 11/29/2023 PRESENT   Final   Performed at [facility], [address], [place], [place] [address]   Sodium 11/29/2023 133 (L)  135 - 145 mmol/L Final   Potassium 11/29/2023 3.1 (L)  3.5 - 5.1 mmol/L Final   Chloride 11/29/2023 93 (L)  98 - 111 mmol/L Final   CO2 11/29/2023 30  22 - 32 mmol/L Final   Glucose, Bld 11/29/2023 99  70 - 99 mg/dL Final   Glucose reference range applies only to samples taken after fasting for at least 8 hours.   BUN 11/29/2023 10  6 - 20 mg/dL Final   Creatinine, Ser 11/29/2023 0.76  0.61 - 1.24 mg/dL Final   Calcium  11/29/2023 9.1  8.9 - 10.3 mg/dL Final   GFR, Estimated 11/29/2023 >60  >60 mL/min Final   Comment: (NOTE) Calculated using the CKD-EPI Creatinine Equation (2021)    Anion gap 11/29/2023 10  5 - 15 Final   Performed at [facility], [address] [place], [place] [address]   WBC 11/29/2023 7.2  4.0 - 10.5 K/uL Final   RBC 11/29/2023 5.05  4.22 - 5.81 MIL/uL Final   Hemoglobin 11/29/2023 15.5  13.0 - 17.0 g/dL Final   HCT 94/69/7974 44.7  39.0 - 52.0 % Final   MCV 11/29/2023 88.5  80.0 - 100.0 fL Final   MCH 11/29/2023 30.7  26.0 - 34.0 pg Final   MCHC 11/29/2023 34.7  30.0 - 36.0 g/dL Final  RDW 11/29/2023 11.9  11.5 - 15.5 % Final   Platelets 11/29/2023 292  150 - 400 K/uL Final   nRBC 11/29/2023 0.0  0.0 - 0.2 % Final   Performed at [facility], [address], [place], [place] [address]    Blood Alcohol level:  Lab Results  Component Value Date   [facility] <15 05/10/2024   ETH <5 03/24/2016    Metabolic Disorder Labs: No results found for: HGBA1C, MPG No results found for: PROLACTIN Lab Results  Component Value Date   CHOL 187 06/27/2021   TRIG 213 (H) 06/27/2021   HDL 44 06/27/2021   CHOLHDL 4.3 06/27/2021   LDLCALC 106 (H)  06/27/2021    Therapeutic Lab Levels: No results found for: LITHIUM No results found for: VALPROATE No results found for: CBMZ  Physical Findings   AIMS    Flowsheet Row Admission (Discharged) from OP Visit from 06/30/2015 in [facility]AL [facility]BS[facility] UNIT  AIMS Total Score 0   AUDIT    Flowsheet Row Admission (Discharged) from OP Visit from 06/30/2015 in BEHAVIORAL HEALTH OBSERVATION UNIT  Alcohol Use Disorder Identification Test Final Score (AUDIT) 0   PHQ2-9    Flowsheet Row ED from 05/12/2024 in [facility] ED from 05/11/2024 in [facility] Office Visit from 08/15/2021 in [facility] [place] Office Visit from 07/18/2021 in [facility] [place]. [facility]  PHQ-2 Total Score 2 2 0 0  PHQ-9 Total Score 6 6 -- --   Flowsheet Row ED from 05/12/2024 in [facility] ED from 05/11/2024 in [facility] ED from 05/10/2024 in [facility] Emergency Department at [facility]  C-SSRS RISK CATEGORY No Risk No Risk No Risk     Musculoskeletal  Strength & Muscle Tone: within normal limits Gait & Station: normal Patient leans: N/A  Psychiatric Specialty Exam  Presentation  General Appearance:  Appropriate for Environment; Casual  Eye Contact: Minimal  Speech: Clear and Coherent; Normal Rate  Speech Volume: Normal  Handedness: Right   Mood and Affect  Mood: Anxious  Affect: Congruent   Thought Process  Thought Processes: Coherent; Goal Directed; Linear  Descriptions of Associations:Intact  Orientation:Full (Time, Place and Person)  Thought Content:Logical; WDL  Diagnosis of Schizophrenia or Schizoaffective disorder in past: No    Hallucinations:Hallucinations: None  Ideas of Reference:None  Suicidal Thoughts:Suicidal Thoughts:  No  Homicidal Thoughts:Homicidal Thoughts: No   Sensorium  Memory: Immediate Good; Recent Good  Judgment: Fair  Insight: Fair   Art Therapist  Concentration: Good  Attention Span: Good  Recall: Good  Fund of Knowledge: Good  Language: Good   Psychomotor Activity  Psychomotor Activity: Psychomotor Activity: Normal   Assets  Assets: Communication Skills; Desire for Improvement; Financial Resources/Insurance; Social Support; Resilience   Sleep  Sleep: Sleep: Good  Estimated Sleeping Duration (Last 24 Hours): 9.25-9.75 hours (Due to Daylight Saving Time, the durations displayed may not accurately represent documentation during the time change interval)  No data recorded   Physical Exam  Physical Exam Vitals and nursing note reviewed.  Constitutional:      Appearance: Normal appearance. He is well-developed.  HENT:     Head: Normocephalic and atraumatic.     Nose: Nose normal.  Cardiovascular:     Rate and Rhythm: Normal rate.  Pulmonary:  Effort: Pulmonary effort is normal.  Musculoskeletal:        General: Normal range of motion.  Skin:    General: Skin is warm and dry.  Neurological:     Mental Status: He is alert and oriented to person, place, and time.  Psychiatric:        Attention and Perception: Attention and perception normal.        Mood and Affect: Affect normal. Mood is anxious.        Speech: Speech normal.        Behavior: Behavior normal. Behavior is cooperative.        Thought Content: Thought content normal.        Cognition and Memory: Cognition normal.        Judgment: Judgment normal.    Review of Systems  Constitutional: Negative.   HENT: Negative.    Eyes: Negative.   Respiratory: Negative.    Cardiovascular: Negative.   Gastrointestinal: Negative.   Genitourinary: Negative.   Musculoskeletal: Negative.   Skin: Negative.   Neurological: Negative.   Psychiatric/Behavioral:  Positive for substance  abuse. The patient is nervous/anxious.    Blood pressure 120/76, pulse 98, temperature 98.2 F (36.8 C), temperature source Oral, resp. rate 18, SpO2 100%. There is no height or weight on file to calculate BMI.  Treatment Plan Summary: Daily contact with patient to assess and evaluate symptoms and progress in treatment Patient remains voluntary.  Current plan includes transition from [facility] behavioral health facility based crisis to home on 05/19/24 then present to [facility] residential substance use treatment on 05/25/24. Pt will be given resources for shelters to stay in until admission date.  Continue current medications including: Current medications: -Acetaminophen  650 mg every 6 hours, as needed/mild pain -Diphenhydramine 50 mg every 6 hours as needed/itching or allergies -Maalox 30 mL oral every 4 hours, as needed/digestion - Increased Hydroxyzine  25 mg to 50mg  3 times daily as needed/anxiety -Magnesium  hydroxide 30 mL daily as needed/mild constipation -Trazodone  50 mg nightly, as needed/sleep -NicoDerm 24 mg transdermal patch daily/nicotine  withdrawal - Start Nicotine  gum 2mg  q2hrs PRN  - Start Prazosin 1mg  at bedtime to target PTSD symptoms of night terrors.   CIWA Librium protocol, see MAR for details: -Loperamide  2 to 4 mg oral as needed/diarrhea or loose stools -Chlordiazepoxide 1 mg taper for withdrawal symptoms-last dose 05/15/24 -Chlordiazepoxide 1 mg every 6 hours as needed CIWA greater than 10 -Multivitamin with minerals 1 tablet daily -Ondansetron  disintegrating tablet 4 mg every 6 as needed/nausea or vomiting -Thiamine tablet 100 mg daily  Clonidine  Detox protocol, see MAR for details: -Clonidine  0.1 mg daily- last dose on 05/16/24 -Dicyclomine  20 mg every 6 hours as needed/spasms or abdominal cramping -Loperamide  2 to 4 mg oral as needed/diarrhea or loose stools -Methocarbamol  500 mg every 8 hours as needed/muscle spasms -Naproxen  500 mg twice daily as  needed/aching, pain or discomfort -Ondansetron  disintegrating tablet 4mg  every 8 hours as needed/nausea or vomiting   Alan JAYSON Mcardle, NP 05/14/2024 5:25 PM

## 2024-05-14 NOTE — ED Notes (Signed)
 Pt A&Ox4, calm & cooperative and in NAD at this time. Denies SI/HI/AVH. Contracts for safety. Encouragement and support given. Will continue to monitor.

## 2024-05-14 NOTE — ED Notes (Signed)
 Paitent provided lunch.

## 2024-05-14 NOTE — Care Management (Signed)
 [facility] Care Management...  Writer met with patient...  Patient scheduled for discharge 05/19/2024  Patient has been accepted to [facility] appointment scheduled for Monday 05/25/2024 @ 9:00 am.  Clinical Research Associate provided patient with resource list of shelters until he is admitted to [facility].  Writer coordinated meeting with Harlene (PSS) to continue working with patient after discharge to obtain community resources, housing and employment.

## 2024-05-14 NOTE — Group Note (Signed)
 Group Topic: Wellness  Group Date: 05/14/2024 Start Time: 2000 End Time: 2040 Facilitators: Joshua Ellouise CROME  Department: [facility]  Number of Participants: 7  Group Focus: check in Treatment Modality:  Leisure Development Interventions utilized were support Purpose: reinforce self-care  Name: David Andrews Date of Birth: 09/03/1990  MR: [id number]    Level of Participation: active Quality of Participation: cooperative Interactions with others: gave feedback Mood/Affect: appropriate Triggers (if applicable):  Cognition: goal directed Progress: Moderate Response: Pt is waiting to go to [facility] for continued treatment Plan: patient will be encouraged to follow up with goals  Patients Problems:  Patient Active Problem List   Diagnosis Date Noted   Opioid use disorder 05/12/2024   Polysubstance abuse (HCC) 05/12/2024   Opioid abuse (HCC) 08/08/2021   Smoking 07/18/2021   Need for hepatitis B screening test 07/18/2021   Need for hepatitis A immunization 07/18/2021   Bipolar affective disorder in remission 06/28/2021   Chronic hepatitis C without hepatic coma (HCC) 06/28/2021   Opioid abuse with opioid-induced mood disorder (HCC) 06/28/2021   Alcohol abuse 06/28/2021   Osteoarthritis of multiple joints 06/28/2021   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

## 2024-05-14 NOTE — ED Notes (Signed)
 Paitent provided breakfast.

## 2024-05-14 NOTE — Group Note (Signed)
 Group Topic: Positive Affirmations  Group Date: 05/14/2024 Start Time: 1345 End Time: 1430 Facilitators: Veverly Oddis BRAVO, NT  Department: [facility]  Number of Participants: 6  Group Focus: Gratitude Treatment Modality:  Cognitive Behavioral Therapy Interventions utilized were orientation Purpose: regain self-worth  Name: David Andrews Date of Birth: Dec 14, 1990  MR: [id number]    Level of Participation: active Quality of Participation: offered feedback Interactions with others: gave feedback Mood/Affect: bright Triggers (if applicable): none Cognition: coherent/clear Progress: Moderate Response: I am grateful for having a place like [facility] that I am able to go to. Plan: Patient will be encouraged to continue being positive about having a plan as well as to keep attending group.  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

## 2024-05-14 NOTE — Group Note (Signed)
 Group Topic: Positive Affirmations  Group Date: 05/14/2024 Start Time: 1250 End Time: 1308 Facilitators: Melvenia Damien NOVAK, RN  Department: [facility]  Number of Participants: 7  Group Focus: affirmation, check in, and other thankfulness Treatment Modality:  Psychoeducation Interventions utilized were group exercise Purpose: express feelings  Name: David Andrews Date of Birth: 08-08-1990  MR: [id number]    Level of Participation: active Quality of Participation: attentive and cooperative Interactions with others: gave feedback Mood/Affect: appropriate Triggers (if applicable):   Cognition: coherent/clear, insightful, and logical Progress: Gaining insight Response:   Plan: follow-up needed  Patients Problems:  Patient Active Problem List   Diagnosis Date Noted   Opioid use disorder 05/12/2024   Polysubstance abuse (HCC) 05/12/2024   Opioid abuse (HCC) 08/08/2021   Smoking 07/18/2021   Need for hepatitis B screening test 07/18/2021   Need for hepatitis A immunization 07/18/2021   Bipolar affective disorder in remission 06/28/2021   Chronic hepatitis C without hepatic coma (HCC) 06/28/2021   Opioid abuse with opioid-induced mood disorder (HCC) 06/28/2021   Alcohol abuse 06/28/2021   Osteoarthritis of multiple joints 06/28/2021   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

## 2024-05-14 NOTE — Group Note (Signed)
 Group Topic: Change and Accountability  Group Date: 05/13/2024 Start Time: 1900 End Time: 1930 Facilitators: Carollynn Genre, NT  Department: [facility]  Number of Participants: 7  Group Focus: coping skills Treatment Modality:  Cognitive Behavioral Therapy Interventions utilized were support Purpose: increase insight  Name: David Andrews Date of Birth: 1991-03-02  MR: [id number]    Level of Participation: active Quality of Participation: attentive Interactions with others:  and gave feedback Mood/Affect: positive Triggers (if applicable): N/A Cognition: coherent/clear Progress: Gaining insight Response: Good Plan: patient will be encouraged to attend groups  Patients Problems:  Patient Active Problem List   Diagnosis Date Noted   Opioid use disorder 05/12/2024   Polysubstance abuse (HCC) 05/12/2024   Opioid abuse (HCC) 08/08/2021   Smoking 07/18/2021   Need for hepatitis B screening test 07/18/2021   Need for hepatitis A immunization 07/18/2021   Bipolar affective disorder in remission 06/28/2021   Chronic hepatitis C without hepatic coma (HCC) 06/28/2021   Opioid abuse with opioid-induced mood disorder (HCC) 06/28/2021   Alcohol abuse 06/28/2021   Osteoarthritis of multiple joints 06/28/2021   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

## 2024-05-14 NOTE — ED Notes (Signed)
 Pt is in his room, complaining of not being able to sleep. Staff advice he wait for the medication to kick in.

## 2024-05-15 DIAGNOSIS — F1123 Opioid dependence with withdrawal: Secondary | ICD-10-CM | POA: Diagnosis not present

## 2024-05-15 DIAGNOSIS — F1310 Sedative, hypnotic or anxiolytic abuse, uncomplicated: Secondary | ICD-10-CM | POA: Diagnosis not present

## 2024-05-15 DIAGNOSIS — F1010 Alcohol abuse, uncomplicated: Secondary | ICD-10-CM | POA: Diagnosis not present

## 2024-05-15 DIAGNOSIS — F1510 Other stimulant abuse, uncomplicated: Secondary | ICD-10-CM | POA: Diagnosis not present

## 2024-05-15 MED ORDER — LOPERAMIDE HCL 2 MG PO CAPS: 2.0000 mg | ORAL_CAPSULE | ORAL | Status: DC | PRN

## 2024-05-15 NOTE — ED Notes (Signed)
 Pt says he feel aggravated and 'kinda bad' . Pt denies si hi and avh- verbal contract for safety provided. Pt c/o level 8/10 chronic back pain, prn naproxen  and robaxin  administered. Pt ate breakfast. Medications reviewed, questions denied.

## 2024-05-15 NOTE — ED Notes (Signed)
 Attended [facility].

## 2024-05-15 NOTE — Group Note (Signed)
 Group Topic: Relapse and Recovery  Group Date: 05/15/2024 Start Time: 1430 End Time: 1500 Facilitators: Laneta Renea POUR, NT  Department: [facility]  Number of Participants: 1  Group Focus: psychiatric education Treatment Modality:  Psychoeducation Interventions utilized were patient education and problem solving Purpose: enhance coping skills  Name: Audi Wettstein Date of Birth: 11-19-1990  MR: [id number]    Did not attend group.   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

## 2024-05-15 NOTE — [facility] Group Notes (Signed)
 Spiritual Care and Counseling Group Note  05/15/2024 2:45pm  Facilitated by: Librada Donnice Lin   Type of Therapy and Topic:  Hope    Participation Level:  Active  Description of Group:  Group focused on topic of hope.  Patients participated in facilitated discussion around topic, connecting with one another around experiences and definitions for hope.  Group members engaged with group word cloud.  Members selected an image of what hope looks like for them today.  Group engaged in discussion around how their definitions of hope are present today in hospital.       Summary of Patient Progress:  Present throughout group.  Active in group discussion    Therapeutic Modalities: Psycho-social ed, Adlerian, Narrative, MI   Librada Donnice Lin, Chaplain 05/15/2024 11:44 PM

## 2024-05-15 NOTE — ED Notes (Signed)
 Pt is sleeping, no acute distress noted. Respirations are even and labored. Q15 safety checks in place.

## 2024-05-15 NOTE — ED Provider Notes (Addendum)
 Behavioral Health Progress Note  Date and Time: 05/15/2024 10:00AM Name: David Andrews MRN:  [id number]   Subjective: David Andrews is a 33 year old male reportedly looking for detox upon arrival. Patient transitioned to [facility] behavioral health facility based crisis unit on 05/12/2024.  Patient ultimately would like admission to residential substance use treatment at [facility].  Patient specifically requests [facility] as he has been directed by his legal team to seek treatment there for ongoing polysubstance use disorder. David Andrews, is seen face to face by this provider, consulted with Dr. Lawrnce; and chart reviewed on 05/15/24.  Upon admission patient urine drug screen positive for amphetamine, buprenorphine, benzodiazepines, cocaine, methamphetamines and THC.  BAL was negative.  On evaluation David Andrews reports that his sleep was much better last night with the start of prazosin and states I did not end up waking up the hall with my night terrors.  Patient expresses some frustration with the rules of the unit as he was initially allowed to wear his own clothing but was informed that the whole unit had to return or calling and wearing the provided scrubs.  Patient reports ongoing anxiety and inquires about starting different medications.  Educated patient that anxiety may likely be expected due to him detoxing off of multiple substances requiring CIWA and COW assessments.  Patient is requesting a Benadryl for anxiety and is informed that this medication is an antihistamine which he is already prescribed, hydroxyzine  50 mg 3 times daily as needed for anxiety.  Patient is aware that both his Librium and clonidine  tapers will be completed tomorrow and he is planning to discharge on 05/19/24.  Patient's peer support specialist will be meeting with patient today to discuss resources for transitioning into residential as he has to wait 6 days until his bed at [facility] is ready.  Patient  compliant with medications and denies current side effects.  During evaluation David Andrews is sitting in dayroom, in no acute distress.  He is alert & oriented x 4, calm, cooperative and attentive for this assessment.  His mood is anxious with congruent affect.  He has normal speech, and behavior.  Objectively there is no evidence of psychosis/mania or delusional thinking. Pt does not appear to be responding to internal or external stimuli.  Patient is able to converse coherently, goal directed thoughts, no distractibility, or pre-occupation however is hyperfocused on medications with some concerns for medication seeking behaviors.  He denies current suicidal/self-harm/homicidal ideation, psychosis, and paranoia.  Patient answered assessment questions appropriately.        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  He has legal concerns including [facility].  Additional Social History:                         Sleep: Good  Appetite:  Fair  Current Medications:  Current Facility-Administered Medications  Medication Dose Route Frequency Provider Last Rate Last Admin  . acetaminophen  (TYLENOL ) tablet 650 mg  650 mg Oral Q6H PRN Dasie Ellouise CROME, FNP      . alum &  mag hydroxide-simeth (MAALOX/MYLANTA) 200-200-20 MG/5ML suspension 30 mL  30 mL Oral Q4H PRN Dasie Ellouise CROME, FNP   30 mL at 05/15/24 1508  . cloNIDine  (CATAPRES ) tablet 0.1 mg   0.1 mg Oral BH-qamhs Thresa Palma C, NP   0.1 mg at 05/15/24 1001   Followed by  . [START ON 05/16/2024] cloNIDine  (CATAPRES ) tablet 0.1 mg  0.1 mg Oral QAC breakfast Thresa Palma C, NP      . dicyclomine  (BENTYL ) tablet 20 mg  20 mg Oral Q6H PRN Dasie Ellouise CROME, FNP   20 mg at 05/12/24 1719  . diphenhydrAMINE (BENADRYL) capsule 50 mg  50 mg Oral Q6H PRN Allen, Tina L, FNP      . haloperidol (HALDOL) tablet 5 mg  5 mg Oral TID PRN Dasie Ellouise CROME, FNP       And  . diphenhydrAMINE (BENADRYL) capsule 50 mg  50 mg Oral TID PRN Allen, Tina L, FNP      . haloperidol (HALDOL) tablet 5 mg  5 mg Oral TID PRN [name], NP       And  . diphenhydrAMINE (BENADRYL) capsule 50 mg  50 mg Oral TID PRN [name], NP      . haloperidol lactate (HALDOL) injection 5 mg  5 mg Intramuscular TID PRN Dasie Ellouise CROME, FNP       And  . diphenhydrAMINE (BENADRYL) injection 50 mg  50 mg Intramuscular TID PRN Dasie Ellouise CROME, FNP       And  . LORazepam  (ATIVAN ) injection 2 mg  2 mg Intramuscular TID PRN Dasie Ellouise CROME, FNP      . haloperidol lactate (HALDOL) injection 10 mg  10 mg Intramuscular TID PRN Dasie Ellouise CROME, FNP       And  . diphenhydrAMINE (BENADRYL) injection 50 mg  50 mg Intramuscular TID PRN Dasie Ellouise CROME, FNP       And  . LORazepam  (ATIVAN ) injection 2 mg  2 mg Intramuscular TID PRN Dasie Ellouise CROME, FNP      . haloperidol lactate (HALDOL) injection 5 mg  5 mg Intramuscular TID PRN [name], NP       And  . diphenhydrAMINE (BENADRYL) injection 50 mg  50 mg Intramuscular TID PRN [name], NP       And  . LORazepam  (ATIVAN ) injection 2 mg  2 mg Intramuscular TID PRN Randall, [name], NP      . haloperidol lactate (HALDOL) injection 10 mg  10 mg Intramuscular TID PRN [name], NP       And  . diphenhydrAMINE (BENADRYL) injection 50 mg  50 mg Intramuscular TID PRN [name], NP       And  . LORazepam  (ATIVAN ) injection 2 mg  2 mg  Intramuscular TID PRN Randall, [name], NP      . hydrOXYzine  (ATARAX ) tablet 50 mg  50 mg Oral TID PRN George Haggart C, NP   50 mg at 05/15/24 1508  . loperamide  (IMODIUM ) capsule 2 mg  2 mg Oral Q2H PRN Ashlon Lottman C, NP      . magnesium  hydroxide (MILK OF MAGNESIA) suspension 30 mL  30 mL Oral Daily PRN Dasie Ellouise CROME, FNP      . methocarbamol  (ROBAXIN ) tablet 500 mg  500 mg Oral Q8H PRN Dasie Ellouise CROME, FNP   500 mg at 05/15/24 1000  . multivitamin with minerals tablet 1 tablet  1 tablet Oral Daily Dasie,  Ellouise CROME, FNP   1 tablet at 05/15/24 [phone]  . naproxen  (NAPROSYN ) tablet 500 mg  500 mg Oral BID PRN Allen, Tina L, FNP   500 mg at 05/15/24 1000  . nicotine  (NICODERM CQ  - dosed in mg/24 hours) patch 21 mg  21 mg Transdermal Q0600 Dasie Ellouise CROME, FNP   21 mg at 05/15/24 0541  . nicotine  polacrilex (NICORETTE) gum 2 mg  2 mg Oral Q2H PRN Jamisyn Langer C, NP   2 mg at 05/15/24 1509  . thiamine (VITAMIN B1) tablet 100 mg  100 mg Oral Daily Dasie Ellouise CROME, FNP   100 mg at 05/15/24 9041  . traZODone  (DESYREL ) tablet 50 mg  50 mg Oral QHS PRN Dasie Ellouise CROME, FNP   50 mg at 05/14/24 2126   No current outpatient medications on file.    Labs  Lab Results:  Admission on 05/11/2024, Discharged on 05/12/2024  Component Date Value Ref Range Status  . POC Amphetamine UR 05/11/2024 Positive (A)  NONE DETECTED (Cut Off Level 1000 ng/mL) Final  . POC Secobarbital (BAR) 05/11/2024 None Detected  NONE DETECTED (Cut Off Level 300 ng/mL) Final  . POC Buprenorphine (BUP) 05/11/2024 Positive (A)  NONE DETECTED (Cut Off Level 10 ng/mL) Final  . POC Oxazepam (BZO) 05/11/2024 Positive (A)  NONE DETECTED (Cut Off Level 300 ng/mL) Final  . POC Cocaine UR 05/11/2024 Positive (A)  NONE DETECTED (Cut Off Level 300 ng/mL) Final  . POC Methamphetamine UR 05/11/2024 Positive (A)  NONE DETECTED (Cut Off Level 1000 ng/mL) Final  . POC Morphine 05/11/2024 None Detected  NONE DETECTED (Cut Off Level 300 ng/mL) Final  . POC  Methadone  UR 05/11/2024 None Detected  NONE DETECTED (Cut Off Level 300 ng/mL) Final  . POC Oxycodone UR 05/11/2024 None Detected  NONE DETECTED (Cut Off Level 100 ng/mL) Final  . POC Marijuana UR 05/11/2024 Positive (A)  NONE DETECTED (Cut Off Level 50 ng/mL) Final  Admission on 05/10/2024, Discharged on 05/11/2024  Component Date Value Ref Range Status  . Sodium 05/10/2024 136  135 - 145 mmol/L Final  . Potassium 05/10/2024 3.5  3.5 - 5.1 mmol/L Final  . Chloride 05/10/2024 101  98 - 111 mmol/L Final  . CO2 05/10/2024 23  22 - 32 mmol/L Final  . Glucose, Bld 05/10/2024 76  70 - 99 mg/dL Final   Glucose reference range applies only to samples taken after fasting for at least 8 hours.  . BUN 05/10/2024 8  6 - 20 mg/dL Final  . Creatinine, Ser 05/10/2024 0.76  0.61 - 1.24 mg/dL Final  . Calcium  05/10/2024 9.3  8.9 - 10.3 mg/dL Final  . Total Protein 05/10/2024 6.8  6.5 - 8.1 g/dL Final  . Albumin 88/90/7974 3.5  3.5 - 5.0 g/dL Final  . AST 88/90/7974 33  15 - 41 U/L Final  . ALT 05/10/2024 46 (H)  0 - 44 U/L Final  . Alkaline Phosphatase 05/10/2024 81  38 - 126 U/L Final  . Total Bilirubin 05/10/2024 0.3  0.0 - 1.2 mg/dL Final  . GFR, Estimated 05/10/2024 >60  >60 mL/min Final   Comment: (NOTE) Calculated using the CKD-EPI Creatinine Equation (2021)   . Anion gap 05/10/2024 12  5 - 15 Final   Performed at [facility], [address] [place], [place] [address]  . Alcohol, Ethyl (B) 05/10/2024 <15  <15 mg/dL Final   Comment: (NOTE) For medical purposes only. Performed at [facility], [address] [place], [place] [address]   . WBC 05/10/2024 8.0  4.0 - 10.5 K/uL Final  . RBC 05/10/2024 4.74  4.22 - 5.81 MIL/uL Final  . Hemoglobin 05/10/2024 13.5  13.0 - 17.0 g/dL Final  . HCT 88/90/7974 40.7  39.0 - 52.0 % Final  . MCV 05/10/2024 85.9  80.0 - 100.0 fL Final  . MCH 05/10/2024 28.5  26.0 - 34.0 pg Final  . MCHC 05/10/2024 33.2  30.0 - 36.0 g/dL Final  . RDW  88/90/7974 12.9  11.5 - 15.5 % Final  . Platelets 05/10/2024 352  150 - 400 K/uL Final  . nRBC 05/10/2024 0.0  0.0 - 0.2 % Final   Performed at [facility], [address], [place], [place] [address]  . Opiates 05/10/2024 NONE DETECTED  NONE DETECTED Final  . Cocaine 05/10/2024 POSITIVE (A)  NONE DETECTED Final  . Benzodiazepines 05/10/2024 NONE DETECTED  NONE DETECTED Final  . Amphetamines 05/10/2024 POSITIVE (A)  NONE DETECTED Final   Comment: (NOTE) Trazodone  is metabolized in vivo to several metabolites, including pharmacologically active m-CPP, which is excreted in the urine. Immunoassay screens for amphetamines and MDMA have potential cross-reactivity with these compounds and may provide false positive  results.    . Tetrahydrocannabinol 05/10/2024 NONE DETECTED  NONE DETECTED Final  . Barbiturates 05/10/2024 NONE DETECTED  NONE DETECTED Final   Comment: (NOTE) DRUG SCREEN FOR MEDICAL PURPOSES ONLY.  IF CONFIRMATION IS NEEDED FOR ANY PURPOSE, NOTIFY LAB WITHIN 5 DAYS.  LOWEST DETECTABLE LIMITS FOR URINE DRUG SCREEN Drug Class                     Cutoff (ng/mL) Amphetamine and metabolites    1000 Barbiturate and metabolites    200 Benzodiazepine                 200 Opiates and metabolites        300 Cocaine and metabolites        300 THC                            50 Performed at [facility], [address] [place], [place] [address]   Admission on 12/27/2023, Discharged on 12/27/2023  Component Date Value Ref Range Status  . Sodium 12/27/2023 137  135 - 145 mmol/L Final  . Potassium 12/27/2023 3.7  3.5 - 5.1 mmol/L Final  . Chloride 12/27/2023 102  98 - 111 mmol/L Final  . CO2 12/27/2023 22  22 - 32 mmol/L Final  . Glucose, Bld 12/27/2023 90  70 - 99 mg/dL Final   Glucose reference range applies only to samples taken after fasting for at least 8 hours.  . BUN 12/27/2023 8  6 - 20 mg/dL Final  . Creatinine, Ser 12/27/2023 0.78  0.61 - 1.24 mg/dL Final   . Calcium  12/27/2023 8.8 (L)  8.9 - 10.3 mg/dL Final  . GFR, Estimated 12/27/2023 >60  >60 mL/min Final   Comment: (NOTE) Calculated using the CKD-EPI Creatinine Equation (2021)   . Anion gap 12/27/2023 13  5 - 15 Final   Performed at [facility], [address], [place], [place] [address]  . WBC 12/27/2023 6.8  4.0 - 10.5 K/uL Final  . RBC 12/27/2023 4.48  4.22 - 5.81 MIL/uL Final  . Hemoglobin 12/27/2023 13.4  13.0 - 17.0 g/dL Final  . HCT 93/72/7974 40.2  39.0 -  52.0 % Final  . MCV 12/27/2023 89.7  80.0 - 100.0 fL Final  . MCH 12/27/2023 29.9  26.0 - 34.0 pg Final  . MCHC 12/27/2023 33.3  30.0 - 36.0 g/dL Final  . RDW 93/72/7974 12.8  11.5 - 15.5 % Final  . Platelets 12/27/2023 332  150 - 400 K/uL Final  . nRBC 12/27/2023 0.0  0.0 - 0.2 % Final   Performed at [facility], [address], [place], [place] [address]  Admission on 11/29/2023, Discharged on 11/29/2023  Component Date Value Ref Range Status  . Color, Urine 11/29/2023 AMBER (A)  YELLOW Final   BIOCHEMICALS MAY BE AFFECTED BY COLOR  . APPearance 11/29/2023 CLEAR  CLEAR Final  . Specific Gravity, Urine 11/29/2023 1.021  1.005 - 1.030 Final  . pH 11/29/2023 5.0  5.0 - 8.0 Final  . Glucose, UA 11/29/2023 NEGATIVE  NEGATIVE mg/dL Final  . Hgb urine dipstick 11/29/2023 NEGATIVE  NEGATIVE Final  . Bilirubin Urine 11/29/2023 NEGATIVE  NEGATIVE Final  . Ketones, ur 11/29/2023 NEGATIVE  NEGATIVE mg/dL Final  . Protein, ur 94/69/7974 30 (A)  NEGATIVE mg/dL Final  . Nitrite 94/69/7974 NEGATIVE  NEGATIVE Final  . [address] 11/29/2023 NEGATIVE  NEGATIVE Final  . RBC / HPF 11/29/2023 0-5  0 - 5 RBC/hpf Final  . WBC, UA 11/29/2023 0-5  0 - 5 WBC/hpf Final  . Bacteria, UA 11/29/2023 NONE SEEN  NONE SEEN Final  . Squamous Epithelial / HPF 11/29/2023 0-5  0 - 5 /HPF Final  . Mucus 11/29/2023 PRESENT   Final   Performed at [facility], [address] [place], [place] [address]  . Sodium  11/29/2023 133 (L)  135 - 145 mmol/L Final  . Potassium 11/29/2023 3.1 (L)  3.5 - 5.1 mmol/L Final  . Chloride 11/29/2023 93 (L)  98 - 111 mmol/L Final  . CO2 11/29/2023 30  22 - 32 mmol/L Final  . Glucose, Bld 11/29/2023 99  70 - 99 mg/dL Final   Glucose reference range applies only to samples taken after fasting for at least 8 hours.  . BUN 11/29/2023 10  6 - 20 mg/dL Final  . Creatinine, Ser 11/29/2023 0.76  0.61 - 1.24 mg/dL Final  . Calcium  11/29/2023 9.1  8.9 - 10.3 mg/dL Final  . GFR, Estimated 11/29/2023 >60  >60 mL/min Final   Comment: (NOTE) Calculated using the CKD-EPI Creatinine Equation (2021)   . Anion gap 11/29/2023 10  5 - 15 Final   Performed at [facility], [address] [place], [place] [address]  . WBC 11/29/2023 7.2  4.0 - 10.5 K/uL Final  . RBC 11/29/2023 5.05  4.22 - 5.81 MIL/uL Final  . Hemoglobin 11/29/2023 15.5  13.0 - 17.0 g/dL Final  . HCT 94/69/7974 44.7  39.0 - 52.0 % Final  . MCV 11/29/2023 88.5  80.0 - 100.0 fL Final  . MCH 11/29/2023 30.7  26.0 - 34.0 pg Final  . MCHC 11/29/2023 34.7  30.0 - 36.0 g/dL Final  . RDW 94/69/7974 11.9  11.5 - 15.5 % Final  . Platelets 11/29/2023 292  150 - 400 K/uL Final  . nRBC 11/29/2023 0.0  0.0 - 0.2 % Final   Performed at [facility], [address], [place], [place] [address]    Blood Alcohol level:  Lab Results  Component Value Date   [facility] <15 05/10/2024   ETH <5 03/24/2016    Metabolic Disorder Labs: No results  found for: HGBA1C, MPG No results found for: PROLACTIN Lab Results  Component Value Date   CHOL 187 06/27/2021   TRIG 213 (H) 06/27/2021   HDL 44 06/27/2021   CHOLHDL 4.3 06/27/2021   LDLCALC 106 (H) 06/27/2021    Therapeutic Lab Levels: No results found for: LITHIUM No results found for: VALPROATE No results found for: CBMZ  Physical Findings   AIMS    Flowsheet Row Admission (Discharged) from OP Visit from 06/30/2015 in BEHAVIORAL  HEALTH OBSERVATION UNIT  AIMS Total Score 0   AUDIT    Flowsheet Row Admission (Discharged) from OP Visit from 06/30/2015 in BEHAVIORAL HEALTH OBSERVATION UNIT  Alcohol Use Disorder Identification Test Final Score (AUDIT) 0   PHQ2-9    Flowsheet Row ED from 05/12/2024 in [facility] ED from 05/11/2024 in [facility] Office Visit from 08/15/2021 in [facility] Dis - A Dept Of [place]. [facility] Office Visit from 07/18/2021 in [facility] Infect Dis - A Dept Of [place]. [facility]  PHQ-2 Total Score 1 2 0 0  PHQ-9 Total Score 3 6 -- --   Flowsheet Row ED from 05/12/2024 in [facility] ED from 05/11/2024 in [facility] ED from 05/10/2024 in [facility] Emergency Department at [facility]  C-SSRS RISK CATEGORY No Risk No Risk No Risk     Musculoskeletal  Strength & Muscle Tone: within normal limits Gait & Station: normal Patient leans: N/A  Psychiatric Specialty Exam  Presentation  General Appearance:  Appropriate for Environment; Casual  Eye Contact: Minimal  Speech: Clear and Coherent; Normal Rate  Speech Volume: Normal  Handedness: Right   Mood and Affect  Mood: Anxious  Affect: Congruent   Thought Process  Thought Processes: Coherent; Goal Directed; Linear  Descriptions of Associations:Intact  Orientation:Full (Time, Place and Person)  Thought Content:Logical; WDL  Diagnosis of Schizophrenia or Schizoaffective disorder in past: No    Hallucinations:No data recorded  Ideas of Reference:None  Suicidal Thoughts:No data recorded  Homicidal Thoughts:No data recorded   Sensorium  Memory: Immediate Good; Recent Good  Judgment: Fair  Insight: Fair   Art Therapist  Concentration: Good  Attention Span: Good  Recall: Good  Fund of  Knowledge: Good  Language: Good   Psychomotor Activity  Psychomotor Activity: No data recorded   Assets  Assets: Communication Skills; Desire for Improvement; Financial Resources/Insurance; Social Support; Resilience   Sleep  Sleep: No data recorded  Estimated Sleeping Duration (Last 24 Hours): 4.50-5.25 hours (Due to Daylight Saving Time, the durations displayed may not accurately represent documentation during the time change interval)  No data recorded   Physical Exam  Physical Exam Vitals and nursing note reviewed.  Constitutional:      Appearance: Normal appearance. He is well-developed.  HENT:     Head: Normocephalic and atraumatic.     Nose: Nose normal.  Cardiovascular:     Rate and Rhythm: Normal rate.  Pulmonary:     Effort: Pulmonary effort is normal.  Musculoskeletal:        General: Normal range of motion.  Skin:    General: Skin is warm and dry.  Neurological:     Mental Status: He is alert and oriented to person, place, and time.  Psychiatric:        Attention and Perception: Attention and perception normal.        Mood and  Affect: Affect normal. Mood is anxious.        Speech: Speech normal.        Behavior: Behavior normal. Behavior is cooperative.        Thought Content: Thought content normal.        Cognition and Memory: Cognition normal.        Judgment: Judgment normal.    Review of Systems  Constitutional: Negative.   HENT: Negative.    Eyes: Negative.   Respiratory: Negative.    Cardiovascular: Negative.   Gastrointestinal: Negative.   Genitourinary: Negative.   Musculoskeletal: Negative.   Skin: Negative.   Neurological:  Positive for tremors.  Psychiatric/Behavioral:  Positive for substance abuse. The patient is nervous/anxious.    Blood pressure 121/87, pulse 91, temperature 97.7 F (36.5 C), temperature source Oral, resp. rate 18, SpO2 97%. There is no height or weight on file to calculate BMI.  Treatment Plan  Summary: Daily contact with patient to assess and evaluate symptoms and progress in treatment Patient remains voluntary.  Current plan includes transition from [facility] behavioral health facility based crisis to home on 05/19/24 then present to [facility] residential substance use treatment on 05/25/2024. Per social work team:   Scheduled to discharge on Tuesday 05/19/24 at 8:30 am. He will be picked-up by Peer Support Specialist Harlene and taken to [facility]. Harlene will than transport patient to [facility]) to stay until his appointment on 05/25/24 for residential at [facility].       Continue current medications including: Current medications: -Acetaminophen  650 mg every 6 hours, as needed/mild pain -Diphenhydramine 50 mg every 6 hours as needed/itching or allergies -Maalox 30 mL oral every 4 hours, as needed/digestion - Hydroxyzine  50mg  3 times daily as needed/anxiety -Magnesium  hydroxide 30 mL daily as needed/mild constipation -Trazodone  50 mg nightly, as needed/sleep -NicoDerm 24 mg transdermal patch daily/nicotine  withdrawal - Nicotine  gum 2mg  q2hrs PRN  - Prazosin 1mg  at bedtime to target PTSD symptoms of night terrors.   Librium taper completed today, CIWA also discontinued.  Will continue medications for ongoing intermittent withdrawal symptoms and nutrition replacement: -Loperamide  2mg  every 2 hours as needed/diarrhea or loose stools -Multivitamin with minerals 1 tablet daily -Ondansetron  disintegrating tablet 4 mg every 6 as needed/nausea or vomiting -Thiamine tablet 100 mg daily  Clonidine  Detox protocol, see MAR for details: -Clonidine  0.1 mg - last dose on 05/16/24 -Dicyclomine  20 mg every 6 hours as needed/spasms or abdominal   cramping -Methocarbamol  500 mg every 8 hours as needed/muscle spasms -Naproxen  500 mg twice daily as needed/aching, pain or discomfort   Alan JAYSON Mcardle, NP 05/15/2024 9:01 PM

## 2024-05-15 NOTE — ED Notes (Signed)
 A drawstring was found on pt pajamas that he had on. Pt understood that he can not have a pajamas with drawstring . Drawstring was taken out of the pajamas, drawstring locked up in pt locker.

## 2024-05-15 NOTE — Group Note (Signed)
 Group Topic: Recovery Basics  Group Date: 05/15/2024 Start Time: 2000 End Time: 2100 Facilitators: Anice Benton LABOR, NT  Department: [facility]  Number of Participants: 4  Group Focus: abuse issues and substance abuse education(AA Group) Treatment Modality:  Patient-Centered Therapy Interventions utilized were group exercise, patient education, and support Purpose: enhance coping skills, express feelings, increase insight, regain self-worth, and relapse prevention strategies  Name: David Andrews Date of Birth: [birth year]  MR: [id number]    Level of Participation: active Quality of Participation: cooperative Interactions with others: intrusive Mood/Affect: appropriate Triggers (if applicable): N/A Cognition: coherent/clear Progress: Moderate Response: Good Plan: follow-up needed  Patients Problems:  Patient Active Problem List   Diagnosis Date Noted   Opioid use disorder 05/12/2024   Polysubstance abuse (HCC) 05/12/2024   Opioid abuse (HCC) 08/08/2021   Smoking 07/18/2021   Need for hepatitis B screening test 07/18/2021   Need for hepatitis A immunization 07/18/2021   Bipolar affective disorder in remission 06/28/2021   Chronic hepatitis C without hepatic coma (HCC) 06/28/2021   Opioid abuse with opioid-induced mood disorder (HCC) 06/28/2021   Alcohol abuse 06/28/2021   Osteoarthritis of multiple joints 06/28/2021   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

## 2024-05-15 NOTE — Group Note (Signed)
 Group Topic: Wellness  Group Date: 05/15/2024 Start Time: 1200 End Time: 1230 Facilitators: Daved Tinnie HERO, RN  Department: [facility]  Number of Participants: 6  Group Focus: nursing group Treatment Modality:  Psychoeducation Interventions utilized were patient education Purpose: increase insight  Name: David Andrews Date of Birth: 06-23-1991  MR: [id number]    Level of Participation: active Quality of Participation: attentive Interactions with others: gave feedback Mood/Affect: appropriate Triggers (if applicable): n/a  Cognition: coherent/clear Progress: Gaining insight Response: pt expressed understanding Plan: patient will be encouraged to attend future RN education group   Patients Problems:  Patient Active Problem List   Diagnosis Date Noted   Opioid use disorder 05/12/2024   Polysubstance abuse (HCC) 05/12/2024   Opioid abuse (HCC) 08/08/2021   Smoking 07/18/2021   Need for hepatitis B screening test 07/18/2021   Need for hepatitis A immunization 07/18/2021   Bipolar affective disorder in remission 06/28/2021   Chronic hepatitis C without hepatic coma (HCC) 06/28/2021   Opioid abuse with opioid-induced mood disorder (HCC) 06/28/2021   Alcohol abuse 06/28/2021   Osteoarthritis of multiple joints 06/28/2021   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

## 2024-05-15 NOTE — ED Notes (Signed)
 Pt quietly watching TV in dayroom. Pt concerns or complaints at this time. Pt declines prn immodium. Pt ate dinner, staying hydrated.

## 2024-05-16 DIAGNOSIS — F1310 Sedative, hypnotic or anxiolytic abuse, uncomplicated: Secondary | ICD-10-CM | POA: Diagnosis not present

## 2024-05-16 DIAGNOSIS — F1510 Other stimulant abuse, uncomplicated: Secondary | ICD-10-CM | POA: Diagnosis not present

## 2024-05-16 DIAGNOSIS — F1123 Opioid dependence with withdrawal: Secondary | ICD-10-CM | POA: Diagnosis not present

## 2024-05-16 DIAGNOSIS — F1010 Alcohol abuse, uncomplicated: Secondary | ICD-10-CM | POA: Diagnosis not present

## 2024-05-16 MED ORDER — GABAPENTIN 100 MG PO CAPS
100.0000 mg | ORAL_CAPSULE | Freq: Two times a day (BID) | ORAL | Status: DC
  Administered 2024-05-16 (×2): 100 mg via ORAL
  Filled 2024-05-16 (×3): qty 1

## 2024-05-16 MED ORDER — PRAZOSIN HCL 1 MG PO CAPS
1.0000 mg | ORAL_CAPSULE | Freq: Every day | ORAL | Status: DC
  Administered 2024-05-16: 1 mg via ORAL
  Filled 2024-05-16: qty 1

## 2024-05-16 MED ORDER — NAPROXEN 500 MG PO TABS
500.0000 mg | ORAL_TABLET | Freq: Once | ORAL | Status: AC
  Administered 2024-05-17: 500 mg via ORAL
  Filled 2024-05-16: qty 1

## 2024-05-16 NOTE — Group Note (Signed)
 Group Topic: Recovery Basics  Group Date: 05/16/2024 Start Time: 2000 End Time: 2100 Facilitators: Anice Benton LABOR, NT  Department: [facility]  Number of Participants: 5  Group Focus: abuse issues and clarity of thought( AA Group) Treatment Modality:  Patient-Centered Therapy and Solution-Focused Therapy Interventions utilized were group exercise and support Purpose: increase insight, regain self-worth, and relapse prevention strategies  Name: David Andrews Date of Birth: Sep 11, 1990  MR: [id number]    Level of Participation: active Quality of Participation: attentive Interactions with others: gave feedback Mood/Affect: appropriate and positive Triggers (if applicable): N/A Cognition: coherent/clear and goal directed Progress: Moderate Response: good Plan: follow-up needed  Patients Problems:  Patient Active Problem List   Diagnosis Date Noted   Opioid use disorder 05/12/2024   Polysubstance abuse (HCC) 05/12/2024   Opioid abuse (HCC) 08/08/2021   Smoking 07/18/2021   Need for hepatitis B screening test 07/18/2021   Need for hepatitis A immunization 07/18/2021   Bipolar affective disorder in remission 06/28/2021   Chronic hepatitis C without hepatic coma (HCC) 06/28/2021   Opioid abuse with opioid-induced mood disorder (HCC) 06/28/2021   Alcohol abuse 06/28/2021   Osteoarthritis of multiple joints 06/28/2021   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

## 2024-05-16 NOTE — Group Note (Signed)
 Group Topic: Communication  Group Date: 05/16/2024 Start Time: 1200 End Time: 1230 Facilitators: Avian Greenawalt, Zane HERO, RN  Department: [facility]  Number of Participants: 1  Group Focus: check in Treatment Modality:  Individual Therapy Interventions utilized were support Purpose: express feelings  Name: David Andrews Date of Birth: 1990/07/21  MR: [id number]    Level of Participation: moderate Quality of Participation: cooperative Interactions with others: gave feedback Mood/Affect: appropriate Triggers (if applicable): none identified at this time Cognition: coherent/clear and logical Progress: Gaining insight Response: Patient spoke with writer regarding feelings regarding stay, discussed any issues on the unit. No concerns voiced at this time. Plan: patient will be encouraged to attend groups/programming on the unit  Patients Problems:  Patient Active Problem List   Diagnosis Date Noted   Opioid use disorder 05/12/2024   Polysubstance abuse (HCC) 05/12/2024   Opioid abuse (HCC) 08/08/2021   Smoking 07/18/2021   Need for hepatitis B screening test 07/18/2021   Need for hepatitis A immunization 07/18/2021   Bipolar affective disorder in remission 06/28/2021   Chronic hepatitis C without hepatic coma (HCC) 06/28/2021   Opioid abuse with opioid-induced mood disorder (HCC) 06/28/2021   Alcohol abuse 06/28/2021   Osteoarthritis of multiple joints 06/28/2021   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

## 2024-05-16 NOTE — ED Notes (Signed)
 Patient alert & oriented x4. Denies intent to harm self or others when asked. Denies A/VH. Patient reports pain 8/10 in back, patient denies need for pain medication at this time. No acute distress noted. Support and encouragement provided. Scheduled medications administered with no complications. Patient observed in milieu. No inappropriate behaviors observed or reported. Routine safety checks conducted per facility protocol. Encouraged patient to notify staff if any thoughts of harm towards self or others arise. Patient verbalizes understanding and agreement.

## 2024-05-16 NOTE — ED Notes (Addendum)
 Pt is sleeping at the moment. No acute distress noted.

## 2024-05-16 NOTE — Group Note (Signed)
 Group Topic: Recovery Basics  Group Date: 05/16/2024 Start Time: 1315 End Time: 1400 Facilitators: Alyse Leilani LABOR, NT  Department: [facility]  Number of Participants: 6  Group Focus: daily focus Treatment Modality:  Psychoeducation Interventions utilized were problem solving Purpose: increase insight  Name: David Andrews Date of Birth: 05-01-1991  MR: [id number]    Level of Participation: active Quality of Participation: engaged Interactions with others: listened Mood/Affect: positive Triggers (if applicable): none Cognition: coherent/clear Progress: Gaining insight Response: none Plan: patient will be encouraged to keep coming to groups.  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

## 2024-05-16 NOTE — ED Provider Notes (Signed)
 Behavioral Health Progress Note  Date and Time: 05/16/2024 10:00AM Name: David Andrews MRN:  [id number]   Subjective:  David Andrews stated I would like to be restarted on gabapentin  to help with my anxiety.  [name] 33 year old male was seen and evaluated face-to-face by this provider.  He is denying suicidal or homicidal ideations.  Denies auditory visual hallucinations.  Reports a history of night tremors and was previously on Minipress. Stated that he has not been restarted since his admission.  Reports that at discharge he has plans to follow-up with the Suboxone clinic as he was previously given Suboxone tablets by his boss.  Stated diagnoses with generalized anxiety and major depressive disorder.  Stated he was previously prescribed gabapentin  which was helpful in the past.  Patient is rating his anxiety 10 out of 10 with 10 being the worst.  Discussed reinitiating medication at this time.  He reports plans to follow-up with the residential treatment facility.     Cruise states ongoing symptoms of worry and increased anxiety related to going back to prison on December 12 due to a robbery.  He reports he has been in and out of prison for most of his life.  [name] reports he has been attending a participated within the group sessions with active and engaged participation.  States ongoing symptoms of worry related to grief and loss due to the passing of 2 children.  Discussed initiating antidepressant however declined at this time.  As documented patient has plans to discharge to [facility] in [place] on 11/18.  Chart reviewed discontinued from [facility]WA and [facility] as taper was completed.  Staff to continue to monitor symptoms.  Support encouragement reassurance was provided.  History of presenting illness: [name] is a 33 year old male who endorses history of polysubstance use disorder.  Most recent opioid use 4 days ago.  Most recent methamphetamine use 2 days ago.  Most recent illicit  benzodiazepine use 4 days ago.  Patient endorses symptoms of opioid withdrawal including joint pain, nausea and anxiety.  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  He has legal concerns including scheduled to be admitted to prison 06/16/2024.  Additional Social History:                         Sleep: Good  Appetite:  Fair  Current Medications:  Current Facility-Administered Medications  Medication Dose Route Frequency Provider Last Rate Last Admin   acetaminophen  (TYLENOL ) tablet 650 mg  650 mg Oral Q6H PRN Dasie Ellouise CROME, FNP       alum & mag hydroxide-simeth (MAALOX/MYLANTA) 200-200-20 MG/5ML suspension 30 mL  30 mL Oral Q4H PRN Dasie Ellouise CROME, FNP   30 mL at 05/15/24 1508   cloNIDine  (CATAPRES ) tablet 0.1 mg  0.1 mg Oral QAC breakfast [name], [name], NP   0.1 mg at 05/16/24 0919   dicyclomine  (BENTYL ) tablet 20 mg  20 mg Oral Q6H PRN Dasie Ellouise CROME, FNP   20 mg at 05/12/24 1719  diphenhydrAMINE (BENADRYL) capsule 50 mg  50 mg Oral Q6H PRN Allen, Tina L, FNP       haloperidol (HALDOL) tablet 5 mg  5 mg Oral TID PRN Allen, Tina L, FNP       And   diphenhydrAMINE (BENADRYL) capsule 50 mg  50 mg Oral TID PRN Allen, Tina L, FNP       haloperidol (HALDOL) tablet 5 mg  5 mg Oral TID PRN [name], [name], NP       And   diphenhydrAMINE (BENADRYL) capsule 50 mg  50 mg Oral TID  PRN [name], [name], NP       haloperidol lactate (HALDOL) injection 5 mg  5 mg Intramuscular TID PRN Allen, Tina L, FNP       And   diphenhydrAMINE (BENADRYL) injection 50 mg  50 mg Intramuscular TID PRN Dasie Ellouise CROME, FNP       And   LORazepam  (ATIVAN ) injection 2 mg  2 mg Intramuscular TID PRN Allen, Tina L, FNP       haloperidol lactate (HALDOL) injection 10 mg  10 mg Intramuscular TID PRN Allen, Tina L, FNP       And   diphenhydrAMINE (BENADRYL) injection 50 mg  50 mg Intramuscular TID PRN Dasie Ellouise CROME, FNP       And   LORazepam  (ATIVAN ) injection 2 mg  2 mg Intramuscular TID PRN Allen, Tina L, FNP       haloperidol lactate (HALDOL) injection 5 mg  5 mg Intramuscular TID PRN [name], [name], NP       And   diphenhydrAMINE (BENADRYL) injection 50 mg  50 mg Intramuscular TID PRN [name], NP       And   LORazepam  (ATIVAN ) injection 2 mg  2 mg Intramuscular TID PRN [name], [name], NP       haloperidol lactate (HALDOL) injection 10 mg  10 mg Intramuscular TID PRN [name], NP       And   diphenhydrAMINE (BENADRYL) injection 50 mg  50 mg Intramuscular TID PRN [name], NP       And   LORazepam  (ATIVAN ) injection 2 mg  2 mg Intramuscular TID PRN Randall, [name], NP       gabapentin  (NEURONTIN ) capsule 100 mg  100 mg Oral BID [name], NP       hydrOXYzine  (ATARAX ) tablet 50 mg  50 mg Oral TID PRN [name], [name], NP   50 mg at 05/16/24 9081   loperamide  (IMODIUM ) capsule 2 mg  2 mg Oral Q2H PRN [name], [name], NP       magnesium  hydroxide (MILK OF MAGNESIA) suspension 30 mL  30 mL Oral Daily PRN Allen, Tina L, FNP       methocarbamol  (ROBAXIN ) tablet 500 mg  500 mg Oral Q8H PRN Allen, Tina L, FNP   500 mg at 05/15/24 1000   multivitamin with minerals tablet 1 tablet  1 tablet Oral Daily Dasie Ellouise CROME, FNP   1 tablet at 05/16/24 0919   naproxen  (NAPROSYN ) tablet 500 mg  500 mg Oral BID PRN Allen, Tina L, FNP   500 mg at  05/15/24 2113   nicotine  (NICODERM CQ  - dosed in mg/24 hours) patch 21 mg  21 mg Transdermal Q0600 Dasie Ellouise CROME, FNP   21 mg at 05/16/24 9362   nicotine  polacrilex (NICORETTE) gum 2 mg  2 mg Oral Q2H PRN [name], [name], NP  2 mg at 05/16/24 0919   prazosin (MINIPRESS) capsule 1 mg  1 mg Oral QHS Vaudine Dutan N, NP       thiamine (VITAMIN B1) tablet 100 mg  100 mg Oral Daily Allen, Tina L, FNP   100 mg at 05/16/24 0919   traZODone  (DESYREL ) tablet 50 mg  50 mg Oral QHS PRN Allen, Tina L, FNP   50 mg at 05/15/24 2113   No current outpatient medications on file.    Labs  Lab Results:  Admission on 05/11/2024, Discharged on 05/12/2024  Component Date Value Ref Range Status   POC Amphetamine UR 05/11/2024 Positive (A)  NONE DETECTED (Cut Off Level 1000 ng/mL) Final   POC Secobarbital (BAR) 05/11/2024 None Detected  NONE DETECTED (Cut Off Level 300 ng/mL) Final   POC Buprenorphine (BUP) 05/11/2024 Positive (A)  NONE DETECTED (Cut Off Level 10 ng/mL) Final   POC Oxazepam (BZO) 05/11/2024 Positive (A)  NONE DETECTED (Cut Off Level 300 ng/mL) Final   POC Cocaine UR 05/11/2024 Positive (A)  NONE DETECTED (Cut Off Level 300 ng/mL) Final   POC Methamphetamine UR 05/11/2024 Positive (A)  NONE DETECTED (Cut Off Level 1000 ng/mL) Final   POC Morphine 05/11/2024 None Detected  NONE DETECTED (Cut Off Level 300 ng/mL) Final   POC Methadone  UR 05/11/2024 None Detected  NONE DETECTED (Cut Off Level 300 ng/mL) Final   POC Oxycodone UR 05/11/2024 None Detected  NONE DETECTED (Cut Off Level 100 ng/mL) Final   POC Marijuana UR 05/11/2024 Positive (A)  NONE DETECTED (Cut Off Level 50 ng/mL) Final  Admission on 05/10/2024, Discharged on 05/11/2024  Component Date Value Ref Range Status   Sodium 05/10/2024 136  135 - 145 mmol/L Final   Potassium 05/10/2024 3.5  3.5 - 5.1 mmol/L Final   Chloride 05/10/2024 101  98 - 111 mmol/L Final   CO2 05/10/2024 23  22 - 32 mmol/L Final   Glucose, Bld 05/10/2024 76  70 -  99 mg/dL Final   Glucose reference range applies only to samples taken after fasting for at least 8 hours.   BUN 05/10/2024 8  6 - 20 mg/dL Final   Creatinine, Ser 05/10/2024 0.76  0.61 - 1.24 mg/dL Final   Calcium  05/10/2024 9.3  8.9 - 10.3 mg/dL Final   Total Protein 88/90/7974 6.8  6.5 - 8.1 g/dL Final   Albumin 88/90/7974 3.5  3.5 - 5.0 g/dL Final   AST 88/90/7974 33  15 - 41 U/L Final   ALT 05/10/2024 46 (H)  0 - 44 U/L Final   Alkaline Phosphatase 05/10/2024 81  38 - 126 U/L Final   Total Bilirubin 05/10/2024 0.3  0.0 - 1.2 mg/dL Final   GFR, Estimated 05/10/2024 >60  >60 mL/min Final   Comment: (NOTE) Calculated using the CKD-EPI Creatinine Equation (2021)    Anion gap 05/10/2024 12  5 - 15 Final   Performed at [facility], [address] [place], [place] [address]   Alcohol, Ethyl (B) 05/10/2024 <15  <15 mg/dL Final   Comment: (NOTE) For medical purposes only. Performed at [facility], [address] [place], [place] [address]    WBC 05/10/2024 8.0  4.0 - 10.5 K/uL Final   RBC 05/10/2024 4.74  4.22 - 5.81 MIL/uL Final   Hemoglobin 05/10/2024 13.5  13.0 - 17.0 g/dL Final   HCT 88/90/7974 40.7  39.0 - 52.0 % Final   MCV 05/10/2024 85.9  80.0 - 100.0 fL Final  MCH 05/10/2024 28.5  26.0 - 34.0 pg Final   MCHC 05/10/2024 33.2  30.0 - 36.0 g/dL Final   RDW 88/90/7974 12.9  11.5 - 15.5 % Final   Platelets 05/10/2024 352  150 - 400 K/uL Final   nRBC 05/10/2024 0.0  0.0 - 0.2 % Final   Performed at [facility], [address], [place], [place] [address]   Opiates 05/10/2024 NONE DETECTED  NONE DETECTED Final   Cocaine 05/10/2024 POSITIVE (A)  NONE DETECTED Final   Benzodiazepines 05/10/2024 NONE DETECTED  NONE DETECTED Final   Amphetamines 05/10/2024 POSITIVE (A)  NONE DETECTED Final   Comment: (NOTE) Trazodone  is metabolized in vivo to several metabolites, including pharmacologically active m-CPP, which is excreted in the urine. Immunoassay screens for  amphetamines and MDMA have potential cross-reactivity with these compounds and may provide false positive  results.     Tetrahydrocannabinol 05/10/2024 NONE DETECTED  NONE DETECTED Final   Barbiturates 05/10/2024 NONE DETECTED  NONE DETECTED Final   Comment: (NOTE) DRUG SCREEN FOR MEDICAL PURPOSES ONLY.  IF CONFIRMATION IS NEEDED FOR ANY PURPOSE, NOTIFY LAB WITHIN 5 DAYS.  LOWEST DETECTABLE LIMITS FOR URINE DRUG SCREEN Drug Class                     Cutoff (ng/mL) Amphetamine and metabolites    1000 Barbiturate and metabolites    200 Benzodiazepine                 200 Opiates and metabolites        300 Cocaine and metabolites        300 THC                            50 Performed at [facility], [address], [place], [place] [address]   Admission on 12/27/2023, Discharged on 12/27/2023  Component Date Value Ref Range Status   Sodium 12/27/2023 137  135 - 145 mmol/L Final   Potassium 12/27/2023 3.7  3.5 - 5.1 mmol/L Final   Chloride 12/27/2023 102  98 - 111 mmol/L Final   CO2 12/27/2023 22  22 - 32 mmol/L Final   Glucose, Bld 12/27/2023 90  70 - 99 mg/dL Final   Glucose reference range applies only to samples taken after fasting for at least 8 hours.   BUN 12/27/2023 8  6 - 20 mg/dL Final   Creatinine, Ser 12/27/2023 0.78  0.61 - 1.24 mg/dL Final   Calcium  12/27/2023 8.8 (L)  8.9 - 10.3 mg/dL Final   GFR, Estimated 12/27/2023 >60  >60 mL/min Final   Comment: (NOTE) Calculated using the CKD-EPI Creatinine Equation (2021)    Anion gap 12/27/2023 13  5 - 15 Final   Performed at [facility], [address], [place], [place] [address]   WBC 12/27/2023 6.8  4.0 - 10.5 K/uL Final   RBC 12/27/2023 4.48  4.22 - 5.81 MIL/uL Final   Hemoglobin 12/27/2023 13.4  13.0 - 17.0 g/dL Final   HCT 93/72/7974 40.2  39.0 - 52.0 % Final   MCV 12/27/2023 89.7  80.0 - 100.0 fL Final   MCH 12/27/2023 29.9  26.0 - 34.0 pg Final   MCHC 12/27/2023 33.3  30.0 - 36.0 g/dL Final    RDW 93/72/7974 12.8  11.5 - 15.5 % Final   Platelets 12/27/2023 332  150 - 400 K/uL Final   nRBC 12/27/2023 0.0  0.0 - 0.2 % Final   Performed at [facility], [address], [place], [place] [address]  Admission on 11/29/2023, Discharged on 11/29/2023  Component Date Value Ref Range Status   Color, Urine 11/29/2023 AMBER (A)  YELLOW Final   BIOCHEMICALS MAY BE AFFECTED BY COLOR   APPearance 11/29/2023 CLEAR  CLEAR Final   Specific Gravity, Urine 11/29/2023 1.021  1.005 - 1.030 Final   pH 11/29/2023 5.0  5.0 - 8.0 Final   Glucose, UA 11/29/2023 NEGATIVE  NEGATIVE mg/dL Final   Hgb urine dipstick 11/29/2023 NEGATIVE  NEGATIVE Final   Bilirubin Urine 11/29/2023 NEGATIVE  NEGATIVE Final   Ketones, ur 11/29/2023 NEGATIVE  NEGATIVE mg/dL Final   Protein, ur 94/69/7974 30 (A)  NEGATIVE mg/dL Final   Nitrite 94/69/7974 NEGATIVE  NEGATIVE Final   Leukocytes,Ua 11/29/2023 NEGATIVE  NEGATIVE Final   RBC / HPF 11/29/2023 0-5  0 - 5 RBC/hpf Final   WBC, UA 11/29/2023 0-5  0 - 5 WBC/hpf Final   Bacteria, UA 11/29/2023 NONE SEEN  NONE SEEN Final   Squamous Epithelial / HPF 11/29/2023 0-5  0 - 5 /HPF Final   Mucus 11/29/2023 PRESENT   Final   Performed at [facility], [address], [place], [place] [address]   Sodium 11/29/2023 133 (L)  135 - 145 mmol/L Final   Potassium 11/29/2023 3.1 (L)  3.5 - 5.1 mmol/L Final   Chloride 11/29/2023 93 (L)  98 - 111 mmol/L Final   CO2 11/29/2023 30  22 - 32 mmol/L Final   Glucose, Bld 11/29/2023 99  70 - 99 mg/dL Final   Glucose reference range applies only to samples taken after fasting for at least 8 hours.   BUN 11/29/2023 10  6 - 20 mg/dL Final   Creatinine, Ser 11/29/2023 0.76  0.61 - 1.24 mg/dL Final   Calcium  11/29/2023 9.1  8.9 - 10.3 mg/dL Final   GFR, Estimated 11/29/2023 >60  >60 mL/min Final   Comment: (NOTE) Calculated using the CKD-EPI Creatinine Equation (2021)    Anion gap 11/29/2023 10  5 - 15 Final   Performed  at [facility], [address], [place], [place] [address]   WBC 11/29/2023 7.2  4.0 - 10.5 K/uL Final   RBC 11/29/2023 5.05  4.22 - 5.81 MIL/uL Final   Hemoglobin 11/29/2023 15.5  13.0 - 17.0 g/dL Final   HCT 94/69/7974 44.7  39.0 - 52.0 % Final   MCV 11/29/2023 88.5  80.0 - 100.0 fL Final   MCH 11/29/2023 30.7  26.0 - 34.0 pg Final   MCHC 11/29/2023 34.7  30.0 - 36.0 g/dL Final   RDW 94/69/7974 11.9  11.5 - 15.5 % Final   Platelets 11/29/2023 292  150 - 400 K/uL Final   nRBC 11/29/2023 0.0  0.0 - 0.2 % Final   Performed at [facility], [address], [place], [place] [address]    Blood Alcohol level:  Lab Results  Component Value Date   [facility] <15 05/10/2024   ETH <5 03/24/2016    Metabolic Disorder Labs: No results found for: HGBA1C, MPG No results found for: PROLACTIN Lab Results  Component Value Date   CHOL 187 06/27/2021   TRIG 213 (H) 06/27/2021   HDL 44 06/27/2021   CHOLHDL 4.3 06/27/2021   LDLCALC 106 (H) 06/27/2021    Therapeutic Lab Levels: No results found for: LITHIUM No results found for: VALPROATE No results found for: CBMZ  Physical Findings   AIMS    Flowsheet Row Admission (Discharged) from OP Visit from 06/30/2015 in [facility]  AIMS Total Score 0   AUDIT    Flowsheet Row Admission (Discharged) from OP Visit from 06/30/2015 in [facility]AV[facility]AL [facility]ALTH [facility]BSERV[facility] UNIT  Alcohol Use Disorder Identification Test Final Score (AUDIT) 0   PHQ2-9    Flowsheet Row ED from 05/12/2024 in [facility] ED from 05/11/2024 in [facility] Office Visit from 08/15/2021 in [facility] - A Dept Of [place]. [facility] Office Visit from 07/18/2021 in [facility] Infect Dis - A Dept Of [place]. [facility]  PHQ-2 Total Score 1 2 0 0  PHQ-9 Total Score 3 6 -- --   Flowsheet Row ED from 05/12/2024  in [facility] ED from 05/11/2024 in [facility] ED from 05/10/2024 in [facility] Emergency Department at [facility]  C-SSRS RISK CATEGORY No Risk No Risk No Risk     Musculoskeletal  Strength & Muscle Tone: within normal limits Gait & Station: normal Patient leans: N/A  Psychiatric Specialty Exam  Presentation  General Appearance:  Appropriate for Environment; Casual  Eye Contact: Minimal  Speech: Clear and Coherent; Normal Rate  Speech Volume: Normal  Handedness: Right   Mood and Affect  Mood: Anxious  Affect: Congruent   Thought Process  Thought Processes: Coherent; Goal Directed; Linear  Descriptions of Associations:Intact  Orientation:Full (Time, Place and Person)  Thought Content:Logical; WDL  Diagnosis of Schizophrenia or Schizoaffective disorder in past: No    Hallucinations:No data recorded  Ideas of Reference:None  Suicidal Thoughts:No data recorded  Homicidal Thoughts:No data recorded   Sensorium  Memory: Immediate Good; Recent Good  Judgment: Fair  Insight: Fair   Art Therapist  Concentration: Good  Attention Span: Good  Recall: Good  Fund of Knowledge: Good  Language: Good   Psychomotor Activity  Psychomotor Activity: No data recorded   Assets  Assets: Communication Skills; Desire for Improvement; Financial Resources/Insurance; Social Support; Resilience   Sleep  Sleep: No data recorded  Estimated Sleeping Duration (Last 24 Hours): 8.00-9.75 hours (Due to Daylight Saving Time, the durations displayed may not accurately represent documentation during the time change interval)  No data recorded   Physical Exam  Physical Exam Vitals and nursing note reviewed.  Constitutional:      Appearance: He is well-developed.  Psychiatric:        Attention and Perception: Attention and perception normal.        Mood and Affect: Affect  normal. Mood is anxious.        Speech: Speech normal.        Behavior: Behavior is cooperative.        Cognition and Memory: Cognition normal.    Review of Systems  Neurological:  Negative for tremors.  Psychiatric/Behavioral:  Positive for substance abuse. The patient is nervous/anxious.   All other systems reviewed and are negative.  Blood pressure 115/76, pulse 85, temperature 98.7 F (37.1 C), temperature source Oral, resp. rate 20, SpO2 94%. There is no height or weight on file to calculate BMI.  Treatment Plan Summary: Daily contact with patient to assess and evaluate symptoms and progress in treatment  Continue with current treatment plan on 05/16/2024 as listed below except were noted  Polysubstance dependency: Generalized anxiety disorder:  -Initiated gabapentin  100 mg p.o. twice daily - Hydroxyzine   50mg  3 times daily as needed/anxiety -Trazodone  50 mg nightly, as needed/sleep - Prazosin 1mg  at bedtime to target PTSD symptoms of night terrors.  Librium taper completed  and CIWA also discontinued on 05/15/2024  CSW to continue working on discharge disposition Patient encouraged to participate in the therapeutic milieu  Staci LOISE Kerns, NP 05/16/2024 11:11 AM

## 2024-05-16 NOTE — Progress Notes (Signed)
 Patient is alert and oriented x 4 with no acute distress noted.  Patient has been calm and cooperative during shift, and observed watching TV.  Patient denies SI/HI/AVH at this time. Patient was observed in the dayroom watching TV, and also attended AA meeting.  Patient was compliant with scheduled medications at this time.  No issues noted at this time.  Patient did request Naproxen  for pain, however order had expired.  This writer attempted to reach out to provider with no response at this time.  Will continue to monitor Q 15 min safety checks for safety/behavior.

## 2024-05-16 NOTE — Progress Notes (Signed)
 Patient is currently resting with eyes closed at this time.  No acute distress noted.  Respirations present, even and unlabored.  No current issues noted at this time.  Will continue to monitor Q 15 min safety checks for safety/behavior.

## 2024-05-16 NOTE — ED Notes (Signed)
 Pt is sleeping. Respirations are even and labored. Q15 safety checks checks are in place per facility policy.

## 2024-05-17 DIAGNOSIS — F1510 Other stimulant abuse, uncomplicated: Secondary | ICD-10-CM | POA: Diagnosis not present

## 2024-05-17 DIAGNOSIS — F1310 Sedative, hypnotic or anxiolytic abuse, uncomplicated: Secondary | ICD-10-CM | POA: Diagnosis not present

## 2024-05-17 DIAGNOSIS — F1010 Alcohol abuse, uncomplicated: Secondary | ICD-10-CM | POA: Diagnosis not present

## 2024-05-17 DIAGNOSIS — F1123 Opioid dependence with withdrawal: Secondary | ICD-10-CM | POA: Diagnosis not present

## 2024-05-17 MED ORDER — GABAPENTIN 100 MG PO CAPS
200.0000 mg | ORAL_CAPSULE | Freq: Two times a day (BID) | ORAL | Status: DC
  Administered 2024-05-17 – 2024-05-18 (×3): 200 mg via ORAL
  Filled 2024-05-17 (×2): qty 2
  Filled 2024-05-17 (×2): qty 28
  Filled 2024-05-17: qty 2
  Filled 2024-05-17: qty 28

## 2024-05-17 NOTE — Group Note (Unsigned)
 Group Topic: Relapse and Recovery  Group Date: 05/17/2024 Start Time: 1330 End Time: 1400 Facilitators: Clodfelter-Simmons, Gladstone Rosas LITTIE, NT  Department: [facility]  Number of Participants: 9  Group Focus: substance abuse education Treatment Modality:  Psychoeducation Interventions utilized were problem solving Purpose: relapse prevention strategies   Name: David Andrews Date of Birth: 12/20/1990  MR: [id number]    Level of Participation: {THERAPIES; PSYCH GROUP PARTICIPATION OZCZO:76008} Quality of Participation: {THERAPIES; PSYCH QUALITY OF PARTICIPATION:23992} Interactions with others: {THERAPIES; PSYCH INTERACTIONS:23993} Mood/Affect: {THERAPIES; PSYCH MOOD/AFFECT:23994} Triggers (if applicable): *** Cognition: {THERAPIES; PSYCH COGNITION:23995} Progress: {THERAPIES; PSYCH PROGRESS:23997} Response: *** Plan: {THERAPIES; PSYCH EOJW:76003}  Patients Problems:  Patient Active Problem List   Diagnosis Date Noted   Opioid use disorder 05/12/2024   Polysubstance abuse (HCC) 05/12/2024   Opioid abuse (HCC) 08/08/2021   Smoking 07/18/2021   Need for hepatitis B screening test 07/18/2021   Need for hepatitis A immunization 07/18/2021   Bipolar affective disorder in remission 06/28/2021   Chronic hepatitis C without hepatic coma (HCC) 06/28/2021   Opioid abuse with opioid-induced mood disorder (HCC) 06/28/2021   Alcohol abuse 06/28/2021   Osteoarthritis of multiple joints 06/28/2021   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

## 2024-05-17 NOTE — ED Notes (Signed)
 Patient calm, cooperative while communicating in dayroom. Patient has no current complaints of pain. Snacks and nutrition offered.

## 2024-05-17 NOTE — Group Note (Unsigned)
 Group Topic: Relapse and Recovery  Group Date: 05/17/2024 Start Time: 1330 End Time: 1400 Facilitators: Clodfelter-Simmons, Regina Ganci LITTIE, NT  Department: [facility]  Number of Participants: 9  Group Focus: relapse prevention Treatment Modality:  Psychoeducation Interventions utilized were problem solving Purpose: relapse prevention strategies   Name: David Andrews Date of Birth: [birth year]  MR: [id number]    Level of Participation: {THERAPIES; PSYCH GROUP PARTICIPATION OZCZO:76008} Quality of Participation: {THERAPIES; PSYCH QUALITY OF PARTICIPATION:23992} Interactions with others: {THERAPIES; PSYCH INTERACTIONS:23993} Mood/Affect: {THERAPIES; PSYCH MOOD/AFFECT:23994} Triggers (if applicable): *** Cognition: {THERAPIES; PSYCH COGNITION:23995} Progress: {THERAPIES; PSYCH PROGRESS:23997} Response: *** Plan: {THERAPIES; PSYCH EOJW:76003}  Patients Problems:  Patient Active Problem List   Diagnosis Date Noted   Opioid use disorder 05/12/2024   Polysubstance abuse (HCC) 05/12/2024   Opioid abuse (HCC) 08/08/2021   Smoking 07/18/2021   Need for hepatitis B screening test 07/18/2021   Need for hepatitis A immunization 07/18/2021   Bipolar affective disorder in remission 06/28/2021   Chronic hepatitis C without hepatic coma (HCC) 06/28/2021   Opioid abuse with opioid-induced mood disorder (HCC) 06/28/2021   Alcohol abuse 06/28/2021   Osteoarthritis of multiple joints 06/28/2021   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

## 2024-05-17 NOTE — ED Provider Notes (Signed)
 Behavioral Health Progress Note  Date and Time: 05/17/2024 10:00AM Name: David Andrews MRN:  [id number]   Subjective:  [name] stated  slept okay with the gabapentin  is helping with my anxiety.  Can we increase the gabapentin ?  Maximilien Hayashi was seen and evaluated face-to-face by this provider.  Patient observed sitting in dayroom and interacting with peers.  Denied suicidal or homicidal ideations.  Denied auditory visual hallucination seems to rated depression 6 out of 10 with 10 being the worst.  PHQ-9 20  However, declined initiating any psychotropic medications for depression and anxiety.  Anthoni reports experiencing adverse reactions to antidepressants in the past to include hallucinations. He is unable to recall the name of the medication.  Stated that he has taken Abilify  previously and  will consider initiating something for mood at a later date.  Shannon remains focused on gabapentin  for anxiety.  Per previous assessment 05/16/2024 patient was initiated on gabapentin  100 mg twice daily.  Will taper gabapentin  to 200 mg twice daily.  Discontinue Minipress due to AM blood pressure 108/69.  He declined lightheaded or dizziness.  Encouraged fluid hydration and report any symptoms to staff.  Continue to endorse mild bodyaches and increased anxiety and joint pain.  No concerns related to suicidal or homicidal ideations.  Denies auditory visual hallucinations.  States he has been attending group sessions.  Active and engaged to patient.  Support,  encouragement and  reassurance provided.  History of presenting illness: Sheron Robin is a 33 year old male who endorses history of polysubstance use disorder.  Most recent opioid use 4 days ago.  Most recent methamphetamine use 2 days ago.  Most recent illicit benzodiazepine use 4 days ago.  Patient endorses symptoms of opioid withdrawal including joint pain, nausea and anxiety.  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  He has legal concerns including scheduled [facility] be [facility]/2024.  Additional Social History:                         Sleep: Good  Appetite:  Fair  Current Medications:  Current Facility-Administered Medications  Medication Dose Route Frequency Provider Last Rate Last Admin   acetaminophen  (TYLENOL ) tablet 650 mg  650 mg Oral Q6H PRN Dasie Ellouise CROME, FNP       alum & mag hydroxide-simeth (MAALOX/MYLANTA) 200-200-20 MG/5ML suspension 30 mL  30 mL Oral Q4H PRN Dasie Ellouise CROME, FNP   30 mL at 05/16/24 2134   cloNIDine  (CATAPRES ) tablet 0.1 mg  0.1 mg Oral QAC breakfast [name], [name], NP   0.1 mg at 05/16/24 0919   diphenhydrAMINE (BENADRYL) capsule 50 mg  50 mg Oral Q6H PRN Allen, Tina L, FNP   50 mg at 05/16/24 1533   haloperidol (HALDOL) tablet 5 mg  5 mg Oral TID PRN Allen, Tina L, FNP       And   diphenhydrAMINE (BENADRYL) capsule 50 mg  50 mg Oral TID PRN Dasie Ellouise CROME, FNP  haloperidol (HALDOL) tablet 5 mg  5 mg Oral TID PRN [name], [name], NP       And   diphenhydrAMINE (BENADRYL) capsule 50 mg  50 mg Oral TID PRN [name], [name], NP       haloperidol lactate (HALDOL) injection 5 mg  5 mg Intramuscular TID PRN Allen, Tina L, FNP       And   diphenhydrAMINE (BENADRYL) injection 50 mg  50 mg Intramuscular TID PRN Dasie Ellouise CROME, FNP       And   LORazepam   (ATIVAN ) injection 2 mg  2 mg Intramuscular TID PRN Allen, Tina L, FNP       haloperidol lactate (HALDOL) injection 10 mg  10 mg Intramuscular TID PRN Allen, Tina L, FNP       And   diphenhydrAMINE (BENADRYL) injection 50 mg  50 mg Intramuscular TID PRN Dasie Ellouise CROME, FNP       And   LORazepam  (ATIVAN ) injection 2 mg  2 mg Intramuscular TID PRN Allen, Tina L, FNP       haloperidol lactate (HALDOL) injection 5 mg  5 mg Intramuscular TID PRN [name], [name], NP       And   diphenhydrAMINE (BENADRYL) injection 50 mg  50 mg Intramuscular TID PRN [name], NP       And   LORazepam  (ATIVAN ) injection 2 mg  2 mg Intramuscular TID PRN [name], [name], NP       haloperidol lactate (HALDOL) injection 10 mg  10 mg Intramuscular TID PRN [name], NP       And   diphenhydrAMINE (BENADRYL) injection 50 mg  50 mg Intramuscular TID PRN [name], NP       And   LORazepam  (ATIVAN ) injection 2 mg  2 mg Intramuscular TID PRN Randall, [name], NP       gabapentin  (NEURONTIN ) capsule 100 mg  100 mg Oral BID [name], NP   100 mg at 05/16/24 2134   hydrOXYzine  (ATARAX ) tablet 50 mg  50 mg Oral TID PRN [name], [name], NP   50 mg at 05/16/24 2134   loperamide  (IMODIUM ) capsule 2 mg  2 mg Oral Q2H PRN [name], [name], NP       magnesium  hydroxide (MILK OF MAGNESIA) suspension 30 mL  30 mL Oral Daily PRN Allen, Tina L, FNP       multivitamin with minerals tablet 1 tablet  1 tablet Oral Daily Dasie Ellouise CROME, FNP   1 tablet at 05/16/24 0919   naproxen  (NAPROSYN ) tablet 500 mg  500 mg Oral Once Bobbitt, Shalon E, NP       nicotine  (NICODERM CQ  - dosed in mg/24 hours) patch 21 mg  21 mg Transdermal Q0600 Dasie Ellouise CROME, FNP   21 mg at 05/17/24 9391   nicotine  polacrilex (NICORETTE) gum 2 mg  2 mg Oral Q2H PRN [name], [name], NP   2 mg at 05/16/24 1534   prazosin (MINIPRESS) capsule 1 mg  1 mg Oral QHS Lamberto Dinapoli N, NP   1 mg at 05/16/24 2134   thiamine  (VITAMIN B1) tablet 100 mg  100 mg Oral Daily Dasie Ellouise CROME, FNP   100 mg at 05/16/24 0919   traZODone  (DESYREL ) tablet 50 mg  50 mg Oral QHS PRN Allen, Tina L, FNP   50 mg at 05/16/24 2134   No current outpatient medications on file.    Labs  Lab  Results:  Admission on 05/11/2024, Discharged on 05/12/2024  Component Date Value Ref Range Status   POC Amphetamine UR 05/11/2024 Positive (A)  NONE DETECTED (Cut Off Level 1000 ng/mL) Final   POC Secobarbital (BAR) 05/11/2024 None Detected  NONE DETECTED (Cut Off Level 300 ng/mL) Final   POC Buprenorphine (BUP) 05/11/2024 Positive (A)  NONE DETECTED (Cut Off Level 10 ng/mL) Final   POC Oxazepam (BZO) 05/11/2024 Positive (A)  NONE DETECTED (Cut Off Level 300 ng/mL) Final   POC Cocaine UR 05/11/2024 Positive (A)  NONE DETECTED (Cut Off Level 300 ng/mL) Final   POC Methamphetamine UR 05/11/2024 Positive (A)  NONE DETECTED (Cut Off Level 1000 ng/mL) Final   POC Morphine 05/11/2024 None Detected  NONE DETECTED (Cut Off Level 300 ng/mL) Final   POC Methadone  UR 05/11/2024 None Detected  NONE DETECTED (Cut Off Level 300 ng/mL) Final   POC Oxycodone UR 05/11/2024 None Detected  NONE DETECTED (Cut Off Level 100 ng/mL) Final   POC Marijuana UR 05/11/2024 Positive (A)  NONE DETECTED (Cut Off Level 50 ng/mL) Final  Admission on 05/10/2024, Discharged on 05/11/2024  Component Date Value Ref Range Status   Sodium 05/10/2024 136  135 - 145 mmol/L Final   Potassium 05/10/2024 3.5  3.5 - 5.1 mmol/L Final   Chloride 05/10/2024 101  98 - 111 mmol/L Final   CO2 05/10/2024 23  22 - 32 mmol/L Final   Glucose, Bld 05/10/2024 76  70 - 99 mg/dL Final   Glucose reference range applies only to samples taken after fasting for at least 8 hours.   BUN 05/10/2024 8  6 - 20 mg/dL Final   Creatinine, Ser 05/10/2024 0.76  0.61 - 1.24 mg/dL Final   Calcium  05/10/2024 9.3  8.9 - 10.3 mg/dL Final   Total Protein 88/90/7974 6.8  6.5 - 8.1 g/dL Final   Albumin 88/90/7974 3.5   3.5 - 5.0 g/dL Final   AST 88/90/7974 33  15 - 41 U/L Final   ALT 05/10/2024 46 (H)  0 - 44 U/L Final   Alkaline Phosphatase 05/10/2024 81  38 - 126 U/L Final   Total Bilirubin 05/10/2024 0.3  0.0 - 1.2 mg/dL Final   GFR, Estimated 05/10/2024 >60  >60 mL/min Final   Comment: (NOTE) Calculated using the CKD-EPI Creatinine Equation (2021)    Anion gap 05/10/2024 12  5 - 15 Final   Performed at [facility], [address], [place], [place] [address]   Alcohol, Ethyl (B) 05/10/2024 <15  <15 mg/dL Final   Comment: (NOTE) For medical purposes only. Performed at [facility], [address] [place], [place] [address]    WBC 05/10/2024 8.0  4.0 - 10.5 K/uL Final   RBC 05/10/2024 4.74  4.22 - 5.81 MIL/uL Final   Hemoglobin 05/10/2024 13.5  13.0 - 17.0 g/dL Final   HCT 88/90/7974 40.7  39.0 - 52.0 % Final   MCV 05/10/2024 85.9  80.0 - 100.0 fL Final   MCH 05/10/2024 28.5  26.0 - 34.0 pg Final   MCHC 05/10/2024 33.2  30.0 - 36.0 g/dL Final   RDW 88/90/7974 12.9  11.5 - 15.5 % Final   Platelets 05/10/2024 352  150 - 400 K/uL Final   nRBC 05/10/2024 0.0  0.0 - 0.2 % Final   Performed at [facility], [address] [place], [place] [address]   Opiates 05/10/2024 NONE DETECTED  NONE DETECTED Final   Cocaine 05/10/2024 POSITIVE (A)  NONE  DETECTED Final   Benzodiazepines 05/10/2024 NONE DETECTED  NONE DETECTED Final   Amphetamines 05/10/2024 POSITIVE (A)  NONE DETECTED Final   Comment: (NOTE) Trazodone  is metabolized in vivo to several metabolites, including pharmacologically active m-CPP, which is excreted in the urine. Immunoassay screens for amphetamines and MDMA have potential cross-reactivity with these compounds and may provide false positive  results.     Tetrahydrocannabinol 05/10/2024 NONE DETECTED  NONE DETECTED Final   Barbiturates 05/10/2024 NONE DETECTED  NONE DETECTED Final   Comment: (NOTE) DRUG SCREEN FOR MEDICAL PURPOSES ONLY.  IF CONFIRMATION IS  NEEDED FOR ANY PURPOSE, NOTIFY LAB WITHIN 5 DAYS.  LOWEST DETECTABLE LIMITS FOR URINE DRUG SCREEN Drug Class                     Cutoff (ng/mL) Amphetamine and metabolites    1000 Barbiturate and metabolites    200 Benzodiazepine                 200 Opiates and metabolites        300 Cocaine and metabolites        300 THC                            50 Performed at [facility], [address], [place], [place] [address]   Admission on 12/27/2023, Discharged on 12/27/2023  Component Date Value Ref Range Status   Sodium 12/27/2023 137  135 - 145 mmol/L Final   Potassium 12/27/2023 3.7  3.5 - 5.1 mmol/L Final   Chloride 12/27/2023 102  98 - 111 mmol/L Final   CO2 12/27/2023 22  22 - 32 mmol/L Final   Glucose, Bld 12/27/2023 90  70 - 99 mg/dL Final   Glucose reference range applies only to samples taken after fasting for at least 8 hours.   BUN 12/27/2023 8  6 - 20 mg/dL Final   Creatinine, Ser 12/27/2023 0.78  0.61 - 1.24 mg/dL Final   Calcium  12/27/2023 8.8 (L)  8.9 - 10.3 mg/dL Final   GFR, Estimated 12/27/2023 >60  >60 mL/min Final   Comment: (NOTE) Calculated using the CKD-EPI Creatinine Equation (2021)    Anion gap 12/27/2023 13  5 - 15 Final   Performed at [facility], [address] [place], [place] [address]   WBC 12/27/2023 6.8  4.0 - 10.5 K/uL Final   RBC 12/27/2023 4.48  4.22 - 5.81 MIL/uL Final   Hemoglobin 12/27/2023 13.4  13.0 - 17.0 g/dL Final   HCT 93/72/7974 40.2  39.0 - 52.0 % Final   MCV 12/27/2023 89.7  80.0 - 100.0 fL Final   MCH 12/27/2023 29.9  26.0 - 34.0 pg Final   MCHC 12/27/2023 33.3  30.0 - 36.0 g/dL Final   RDW 93/72/7974 12.8  11.5 - 15.5 % Final   Platelets 12/27/2023 332  150 - 400 K/uL Final   nRBC 12/27/2023 0.0  0.0 - 0.2 % Final   Performed at [facility], [address] [place], [place] [address]  Admission on 11/29/2023, Discharged on 11/29/2023  Component Date Value Ref Range Status   Color, Urine 11/29/2023 AMBER  (A)  YELLOW Final   BIOCHEMICALS MAY BE AFFECTED BY COLOR   APPearance 11/29/2023 CLEAR  CLEAR Final   Specific Gravity, Urine 11/29/2023 1.021  1.005 - 1.030 Final   pH 11/29/2023 5.0  5.0 - 8.0 Final   Glucose, UA  11/29/2023 NEGATIVE  NEGATIVE mg/dL Final   Hgb urine dipstick 11/29/2023 NEGATIVE  NEGATIVE Final   Bilirubin Urine 11/29/2023 NEGATIVE  NEGATIVE Final   Ketones, ur 11/29/2023 NEGATIVE  NEGATIVE mg/dL Final   Protein, ur 94/69/7974 30 (A)  NEGATIVE mg/dL Final   Nitrite 94/69/7974 NEGATIVE  NEGATIVE Final   Leukocytes,Ua 11/29/2023 NEGATIVE  NEGATIVE Final   RBC / HPF 11/29/2023 0-5  0 - 5 RBC/hpf Final   WBC, UA 11/29/2023 0-5  0 - 5 WBC/hpf Final   Bacteria, UA 11/29/2023 NONE SEEN  NONE SEEN Final   Squamous Epithelial / HPF 11/29/2023 0-5  0 - 5 /HPF Final   Mucus 11/29/2023 PRESENT   Final   Performed at [facility], [address], [place], [place] [address]   Sodium 11/29/2023 133 (L)  135 - 145 mmol/L Final   Potassium 11/29/2023 3.1 (L)  3.5 - 5.1 mmol/L Final   Chloride 11/29/2023 93 (L)  98 - 111 mmol/L Final   CO2 11/29/2023 30  22 - 32 mmol/L Final   Glucose, Bld 11/29/2023 99  70 - 99 mg/dL Final   Glucose reference range applies only to samples taken after fasting for at least 8 hours.   BUN 11/29/2023 10  6 - 20 mg/dL Final   Creatinine, Ser 11/29/2023 0.76  0.61 - 1.24 mg/dL Final   Calcium  11/29/2023 9.1  8.9 - 10.3 mg/dL Final   GFR, Estimated 11/29/2023 >60  >60 mL/min Final   Comment: (NOTE) Calculated using the CKD-EPI Creatinine Equation (2021)    Anion gap 11/29/2023 10  5 - 15 Final   Performed at [facility], [address] [place], [place] [address]   WBC 11/29/2023 7.2  4.0 - 10.5 K/uL Final   RBC 11/29/2023 5.05  4.22 - 5.81 MIL/uL Final   Hemoglobin 11/29/2023 15.5  13.0 - 17.0 g/dL Final   HCT 94/69/7974 44.7  39.0 - 52.0 % Final   MCV 11/29/2023 88.5  80.0 - 100.0 fL Final   MCH 11/29/2023  30.7  26.0 - 34.0 pg Final   MCHC 11/29/2023 34.7  30.0 - 36.0 g/dL Final   RDW 94/69/7974 11.9  11.5 - 15.5 % Final   Platelets 11/29/2023 292  150 - 400 K/uL Final   nRBC 11/29/2023 0.0  0.0 - 0.2 % Final   Performed at [facility], [address], [place], [place] [address]    Blood Alcohol level:  Lab Results  Component Value Date   [facility] <15 05/10/2024   ETH <5 03/24/2016    Metabolic Disorder Labs: No results found for: HGBA1C, MPG No results found for: PROLACTIN Lab Results  Component Value Date   CHOL 187 06/27/2021   TRIG 213 (H) 06/27/2021   HDL 44 06/27/2021   CHOLHDL 4.3 06/27/2021   LDLCALC 106 (H) 06/27/2021    Therapeutic Lab Levels: No results found for: LITHIUM No results found for: VALPROATE No results found for: CBMZ  Physical Findings   AIMS    Flowsheet Row Admission (Discharged) from OP Visit from 06/30/2015 in [facility]  AIMS Total Score 0   AUDIT    Flowsheet Row Admission (Discharged) from OP Visit from 06/30/2015 in [facility]AL [facility]BS[facility] UNIT  Alcohol Use Disorder Identification Test Final Score (AUDIT) 0   PHQ2-9    Flowsheet Row ED from 05/12/2024 in [facility] ED from 05/11/2024 in [facility] Office Visit from  08/15/2021 in [facility] - A Dept Of [place]. [facility] Office Visit from 07/18/2021 in [facility] - A Dept Of [place]. [facility]  PHQ-2 Total Score 1 2 0 0  PHQ-9 Total Score 3 6 -- --   Flowsheet Row ED from 05/12/2024 in [facility] ED from 05/11/2024 in [facility] ED from 05/10/2024 in [facility] Emergency Department at [facility]  C-SSRS RISK CATEGORY No Risk No Risk No Risk     Musculoskeletal  Strength & Muscle Tone: within normal limits Gait & Station: normal Patient  leans: N/A  Psychiatric Specialty Exam  Presentation  General Appearance:  Appropriate for Environment; Casual  Eye Contact: Minimal  Speech: Clear and Coherent; Normal Rate  Speech Volume: Normal  Handedness: Right   Mood and Affect  Mood: Anxious  Affect: Congruent   Thought Process  Thought Processes: Coherent; Goal Directed; Linear  Descriptions of Associations:Intact  Orientation:Full (Time, Place and Person)  Thought Content:Logical; WDL  Diagnosis of Schizophrenia or Schizoaffective disorder in past: No    Hallucinations:No data recorded  Ideas of Reference:None  Suicidal Thoughts:No data recorded  Homicidal Thoughts:No data recorded   Sensorium  Memory: Immediate Good; Recent Good  Judgment: Fair  Insight: Fair   Art Therapist  Concentration: Good  Attention Span: Good  Recall: Good  Fund of Knowledge: Good  Language: Good   Psychomotor Activity  Psychomotor Activity: No data recorded   Assets  Assets: Communication Skills; Desire for Improvement; Financial Resources/Insurance; Social Support; Resilience   Sleep  Sleep: No data recorded  Estimated Sleeping Duration (Last 24 Hours): 8.25-9.00 hours (Due to Daylight Saving Time, the durations displayed may not accurately represent documentation during the time change interval)  No data recorded   Physical Exam  Physical Exam Vitals and nursing note reviewed.  Constitutional:      Appearance: He is well-developed.  Psychiatric:        Attention and Perception: Attention and perception normal.        Mood and Affect: Affect normal. Mood is anxious.        Speech: Speech normal.        Behavior: Behavior is cooperative.        Cognition and Memory: Cognition normal.    Review of Systems  Neurological:  Negative for tremors.  Psychiatric/Behavioral:  Positive for substance abuse. The patient is nervous/anxious.   All other systems reviewed and are  negative.  Blood pressure 108/69, pulse 86, temperature 98 F (36.7 C), temperature source Oral, resp. rate 17, SpO2 100%. There is no height or weight on file to calculate BMI.  Treatment Plan Summary: Daily contact with patient to assess and evaluate symptoms and progress in treatment  Continue with current treatment plan on 05/17/2024 as listed below except were noted  Polysubstance dependency: Generalized anxiety disorder:  -Continue gabapentin  200 mg p.o. twice daily - Hydroxyzine  50mg  3 times daily as needed/anxiety -Trazodone  50 mg nightly, as needed/sleep -  Discontinued Prazosin 1mg  at bedtime to target PTSD symptoms of night terrors due to b/p 108/66  Librium taper completed  and CIWA also discontinued on 05/15/2024  CSW to continue working on discharge disposition Patient encouraged to participate in the therapeutic milieu  Staci LOISE Kerns, NP 05/17/2024 8:34 AM

## 2024-05-17 NOTE — Group Note (Signed)
 Group Topic: Healthy Self Image and Positive Change  Group Date: 05/17/2024 Start Time: 2030 End Time: 2100 Facilitators: Anice Benton LABOR, NT  Department: [facility]  Number of Participants: 6  Group Focus: affirmation, clarity of thought, and communication Treatment Modality:  Cognitive Behavioral Therapy and Patient-Centered Therapy Interventions utilized were group exercise and support Purpose: express feelings, increase insight, and regain self-worth  Name: David Andrews Date of Birth: 1990-12-08  MR: [id number]    Level of Participation: active Quality of Participation: cooperative Interactions with others: gave feedback Mood/Affect: appropriate, quiet  Triggers (if applicable): N/A Cognition: coherent/clear Progress: Moderate Response: good Plan: follow-up needed  Patients Problems:  Patient Active Problem List   Diagnosis Date Noted   Opioid use disorder 05/12/2024   Polysubstance abuse (HCC) 05/12/2024   Opioid abuse (HCC) 08/08/2021   Smoking 07/18/2021   Need for hepatitis B screening test 07/18/2021   Need for hepatitis A immunization 07/18/2021   Bipolar affective disorder in remission 06/28/2021   Chronic hepatitis C without hepatic coma (HCC) 06/28/2021   Opioid abuse with opioid-induced mood disorder (HCC) 06/28/2021   Alcohol abuse 06/28/2021   Osteoarthritis of multiple joints 06/28/2021   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

## 2024-05-17 NOTE — Progress Notes (Signed)
 Patient is currently resting with eyes closed at this time with no acute distress noted.  Respirations present, even and unlabored.  No current issues noted.  Will continue Q 15 min safety checks for safety/behavior.

## 2024-05-17 NOTE — Progress Notes (Signed)
 Patient is currently resting with eyes closed.  No acute distress noted.  Respirations present, even and unlabored.  No current issues noted.  Will continue Q 15 min safety checks for safety/behavior.

## 2024-05-17 NOTE — ED Notes (Signed)

## 2024-05-17 NOTE — Group Note (Signed)
 Group Topic: Recovery Basics  Group Date: 05/17/2024 Start Time: 1100 End Time: 1200 Facilitators: Raphaella Larkin, Zane HERO, RN; Lawrnce Garvin PARAS, MD; Pamelia Alm LITTIE HENRI  Department: [facility]  Number of Participants: 6  Group Focus: chemical dependency education, chemical dependency issues, communication, diagnosis education, and dual diagnosis Treatment Modality:  Interpersonal Therapy and Psychoeducation Interventions utilized were clarification, patient education, and support Purpose: improve communication skills and increase insight  Name: David Andrews Date of Birth: Jun 15, 1991  MR: [id number]    Level of Participation: active Quality of Participation: attentive and cooperative Interactions with others: gave feedback Mood/Affect: appropriate Triggers (if applicable): None identified at this time Cognition: coherent/clear and logical Progress: Gaining insight Response: Patient remained attentive in group, no concerns noted or voiced Plan: patient will be encouraged to continue to attend groups/programming on the unit  Patients Problems:  Patient Active Problem List   Diagnosis Date Noted   Opioid use disorder 05/12/2024   Polysubstance abuse (HCC) 05/12/2024   Opioid abuse (HCC) 08/08/2021   Smoking 07/18/2021   Need for hepatitis B screening test 07/18/2021   Need for hepatitis A immunization 07/18/2021   Bipolar affective disorder in remission 06/28/2021   Chronic hepatitis C without hepatic coma (HCC) 06/28/2021   Opioid abuse with opioid-induced mood disorder (HCC) 06/28/2021   Alcohol abuse 06/28/2021   Osteoarthritis of multiple joints 06/28/2021   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

## 2024-05-17 NOTE — Group Note (Signed)
 Group Topic: Relapse and Recovery  Group Date: 05/17/2024 Start Time: 1330 End Time: 1400 Facilitators: Clodfelter-Simmons, Clarnce Homan LITTIE, NT  Department: [facility]  Number of Participants: 9  Group Focus: relapse prevention Treatment Modality:  Psychoeducation Interventions utilized were problem solving Purpose: relapse prevention strategies  Name: David Andrews Date of Birth: 1990/10/23  MR: [id number]    Level of Participation: moderate Quality of Participation: cooperative Interactions with others: gave feedback Mood/Affect: appropriate Triggers (if applicable): relationship problems Cognition: logical Progress: Moderate Response: Patient was able to give reasons to stay in recovery. Plan: patient will be encouraged to stay on track with recovery plan  Patients Problems:  Patient Active Problem List   Diagnosis Date Noted   Opioid use disorder 05/12/2024   Polysubstance abuse (HCC) 05/12/2024   Opioid abuse (HCC) 08/08/2021   Smoking 07/18/2021   Need for hepatitis B screening test 07/18/2021   Need for hepatitis A immunization 07/18/2021   Bipolar affective disorder in remission 06/28/2021   Chronic hepatitis C without hepatic coma (HCC) 06/28/2021   Opioid abuse with opioid-induced mood disorder (HCC) 06/28/2021   Alcohol abuse 06/28/2021   Osteoarthritis of multiple joints 06/28/2021   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

## 2024-05-18 DIAGNOSIS — F1510 Other stimulant abuse, uncomplicated: Secondary | ICD-10-CM | POA: Diagnosis not present

## 2024-05-18 DIAGNOSIS — F1010 Alcohol abuse, uncomplicated: Secondary | ICD-10-CM | POA: Diagnosis not present

## 2024-05-18 DIAGNOSIS — F1310 Sedative, hypnotic or anxiolytic abuse, uncomplicated: Secondary | ICD-10-CM | POA: Diagnosis not present

## 2024-05-18 DIAGNOSIS — F1123 Opioid dependence with withdrawal: Secondary | ICD-10-CM | POA: Diagnosis not present

## 2024-05-18 MED ORDER — GABAPENTIN 300 MG PO CAPS: 300.0000 mg | ORAL_CAPSULE | Freq: Two times a day (BID) | ORAL | 0 refills | Status: DC

## 2024-05-18 MED ORDER — HYDROXYZINE HCL 50 MG PO TABS: 50.0000 mg | ORAL_TABLET | Freq: Three times a day (TID) | ORAL | 0 refills | Status: AC | PRN

## 2024-05-18 MED ORDER — TRAZODONE HCL 50 MG PO TABS: 50.0000 mg | ORAL_TABLET | Freq: Every evening | ORAL | 0 refills | Status: AC | PRN

## 2024-05-18 NOTE — ED Notes (Signed)
 Pt escorted off unit by staff for discharge. Pt provided with prescriptions and medication samples. Items returned.

## 2024-05-18 NOTE — ED Provider Notes (Signed)
 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 He wants to ensure medications available for him this morning. He understands that he's discharging to a transitional living (relapse prevention) setting for [facility] while awaiting formal treatment bed availability on 11/24. No medication side effects. He endorses modest response to low dose gabapentin .   Stay Summary: David Andrews is a 33 year old male who endorsed history of polysubstance use disorder self-presented to [facility] on 05/11/24.  Most recent opioid use 4 days prior.  Most recent methamphetamine use 2 days prior.  Most recent illicit benzodiazepine use 4 days prior.  Patient endorses symptoms of opioid withdrawal including joint pain, nausea and anxiety.  Was reported patient was seeking substance abuse treatment and detox.'   [facility]. Patient engaged in multimodal treatment for polysubstance use disorder. Patient quickly adjusted to milieu. The patient was evaluated each day by a clinical provider to ascertain response to treatment. On admission, he declined initiating any psychotropic medications for depression and anxiety.  David Andrews reports experiencing adverse reactions to antidepressants in the past to include hallucinations. He is unable to recall the name of the medication.  Stated that he has taken Abilify  previously and  will consider initiating something for mood at a later date.  David Andrews remained focused on gabapentin  for anxiety.  Per previous assessment 05/16/2024 patient was initiated on gabapentin  100 mg twice daily, increased to 200mg  BID. Discontinued Minipress due to AM blood pressure 108/69.  He declined lightheaded or dizziness.   Encouraged fluid hydration and report any symptoms to staff.  Continue to endorse mild bodyaches and increased anxiety and joint pain.    Improvement was noted by the patient's report of decreasing symptoms, improved sleep and appetite, affect, medication tolerance, behavior, and participation in unit programming. Patient's care was discussed during the interdisciplinary team meeting every day during the hospitalization. Patient was asked each day to complete a self inventory noting mood, mental status, pain, new symptoms, anxiety and concerns. Labs were reviewed with the patient, and abnormal results were discussed with the patient.  Patient was accepted into [facility] treatment but due to backlog admission date 11/24. Patient agreed to placement in a relapse prevention program in the meantime.  Total Time spent with patient: I personally spent 20 minutes on the unit in direct patient care. The direct patient care time included face-to-face time with the patient, reviewing the patient's chart, communicating with other professionals, and coordinating care. Greater than 50% of this time was spent in counseling or coordinating care with the patient regarding goals of hospitalization, psycho-education, and discharge planning needs.   On my assessment the patient denied SI, HI, AVH, paranoia, ideas of reference, or first rank symptoms on day of discharge. Patient denied drug cravings or active signs of withdrawal. Patient denied medication side-effects. Patient was not deemed to be a danger to self or others on day of discharge and was in agreement with discharge plans.   Psychiatric History: Generalized anxiety disorder, polysubstance dependence, bipolar affective disorder, opioid abuse with opioid induced mood disorder, alcohol abuse Past Medical History: Chronic hepatitis C without hepatic coma, osteoarthritis of multiple joints Family History: None reported Family Psychiatric  History: None  reported Social History: David Andrews is currently homeless, he is unemployed.  No social supports reported. He has legal concerns including [facility].  Current Medications:  Current Facility-Administered Medications  Medication Dose Route Frequency Provider Last Rate Last Admin   acetaminophen  (TYLENOL ) tablet 650 mg  650 mg Oral Q6H PRN Allen, Tina L, FNP       alum & mag hydroxide-simeth (MAALOX/MYLANTA) 200-200-20 MG/5ML suspension 30 mL  30 mL Oral Q4H PRN Dasie Ellouise CROME, FNP   30 mL at 05/17/24 1514   diphenhydrAMINE (BENADRYL) capsule 50 mg  50 mg Oral Q6H PRN Allen, Tina L, FNP   50 mg at 05/16/24 1533   haloperidol (HALDOL) tablet 5 mg  5 mg Oral TID PRN Allen, Tina L, FNP       And   diphenhydrAMINE (BENADRYL) capsule 50 mg  50 mg Oral TID PRN Allen, Tina L, FNP       haloperidol (HALDOL) tablet 5 mg  5 mg Oral TID PRN [name], NP       And   diphenhydrAMINE (BENADRYL) capsule 50 mg  50 mg Oral TID PRN [name], [name], NP       haloperidol lactate (HALDOL) injection 5 mg  5 mg Intramuscular TID PRN Allen, Tina L, FNP       And   diphenhydrAMINE (BENADRYL) injection 50 mg  50 mg Intramuscular TID PRN Dasie Ellouise CROME, FNP       And   LORazepam  (ATIVAN ) injection 2 mg  2 mg Intramuscular TID PRN Allen, Tina L, FNP       haloperidol lactate (HALDOL) injection 10 mg  10 mg Intramuscular TID PRN Allen, Tina L, FNP       And   diphenhydrAMINE (BENADRYL) injection 50 mg  50 mg Intramuscular TID PRN Dasie Ellouise CROME, FNP       And   LORazepam  (ATIVAN ) injection 2 mg  2 mg Intramuscular TID PRN Allen, Tina L, FNP       haloperidol lactate (HALDOL) injection 5 mg  5 mg Intramuscular TID PRN [name], [name], NP       And   diphenhydrAMINE (BENADRYL) injection 50 mg  50 mg Intramuscular TID PRN [name], NP       And   LORazepam  (ATIVAN ) injection 2 mg  2 mg Intramuscular TID PRN [name], [name], NP       haloperidol  lactate (HALDOL) injection 10 mg  10 mg Intramuscular TID PRN [name], NP       And   diphenhydrAMINE (BENADRYL) injection 50 mg  50 mg Intramuscular TID PRN [name], NP       And   LORazepam  (ATIVAN ) injection 2 mg  2 mg Intramuscular TID PRN [name], NP       gabapentin  (NEURONTIN ) capsule 200 mg  200 mg Oral BID [name], NP   200 mg at 05/18/24 0844   hydrOXYzine  (ATARAX ) tablet 50 mg  50 mg Oral TID PRN [name], [name], NP   50 mg at 05/17/24 2124   loperamide  (IMODIUM ) capsule 2 mg  2 mg Oral Q2H PRN [name], [name], NP       magnesium  hydroxide (MILK OF MAGNESIA) suspension 30 mL  30 mL Oral Daily PRN Dasie Ellouise CROME, FNP       multivitamin with minerals tablet 1 tablet  1 tablet Oral Daily Dasie Ellouise CROME, FNP   1 tablet at 05/18/24 0843   nicotine  (NICODERM CQ  -  dosed in mg/24 hours) patch 21 mg  21 mg Transdermal Q0600 Dasie Ellouise CROME, FNP   21 mg at 05/18/24 0641   nicotine  polacrilex (NICORETTE) gum 2 mg  2 mg Oral Q2H PRN [name], [name], NP   2 mg at 05/17/24 1832   thiamine (VITAMIN B1) tablet 100 mg  100 mg Oral Daily Dasie Ellouise CROME, FNP   100 mg at 05/18/24 9155   traZODone  (DESYREL ) tablet 50 mg  50 mg Oral QHS PRN Allen, Tina L, FNP   50 mg at 05/17/24 2124   Current Outpatient Medications  Medication Sig Dispense Refill   gabapentin  (NEURONTIN ) 300 MG capsule Take 1 capsule (300 mg total) by mouth 2 (two) times daily. 120 capsule 0   hydrOXYzine  (ATARAX ) 50 MG tablet Take 1 tablet (50 mg total) by mouth 3 (three) times daily as needed for anxiety. 30 tablet 0   traZODone  (DESYREL ) 50 MG tablet Take 1 tablet (50 mg total) by mouth at bedtime as needed for sleep. 30 tablet 0    PTA Medications:  PTA Medications  Medication Sig   hydrOXYzine  (ATARAX ) 50 MG tablet Take 1 tablet (50 mg total) by mouth 3 (three) times daily as needed for anxiety.   traZODone  (DESYREL ) 50 MG tablet Take 1 tablet (50 mg total) by mouth at bedtime as  needed for sleep.   gabapentin  (NEURONTIN ) 300 MG capsule Take 1 capsule (300 mg total) by mouth 2 (two) times daily.   Facility Ordered Medications  Medication   acetaminophen  (TYLENOL ) tablet 650 mg   alum & mag hydroxide-simeth (MAALOX/MYLANTA) 200-200-20 MG/5ML suspension 30 mL   [EXPIRED] chlordiazePOXIDE (LIBRIUM) capsule 25 mg   [COMPLETED] chlordiazePOXIDE (LIBRIUM) capsule 25 mg   Followed by   [COMPLETED] chlordiazePOXIDE (LIBRIUM) capsule 25 mg   Followed by   [COMPLETED] chlordiazePOXIDE (LIBRIUM) capsule 25 mg   Followed by   [COMPLETED] chlordiazePOXIDE (LIBRIUM) capsule 25 mg   [COMPLETED] cloNIDine  (CATAPRES ) tablet 0.1 mg   Followed by   [COMPLETED] cloNIDine  (CATAPRES ) tablet 0.1 mg   Followed by   [COMPLETED] cloNIDine  (CATAPRES ) tablet 0.1 mg   [EXPIRED] dicyclomine  (BENTYL ) tablet 20 mg   diphenhydrAMINE (BENADRYL) capsule 50 mg   [EXPIRED] hydrOXYzine  (ATARAX ) tablet 25 mg   [EXPIRED] loperamide  (IMODIUM ) capsule 2-4 mg   magnesium  hydroxide (MILK OF MAGNESIA) suspension 30 mL   [EXPIRED] methocarbamol  (ROBAXIN ) tablet 500 mg   multivitamin with minerals tablet 1 tablet   [EXPIRED] naproxen  (NAPROSYN ) tablet 500 mg   nicotine  (NICODERM CQ  - dosed in mg/24 hours) patch 21 mg   traZODone  (DESYREL ) tablet 50 mg   [EXPIRED] ondansetron  (ZOFRAN -ODT) disintegrating tablet 4 mg   haloperidol (HALDOL) tablet 5 mg   And   diphenhydrAMINE (BENADRYL) capsule 50 mg   haloperidol lactate (HALDOL) injection 5 mg   And   diphenhydrAMINE (BENADRYL) injection 50 mg   And   LORazepam  (ATIVAN ) injection 2 mg   haloperidol lactate (HALDOL) injection 10 mg   And   diphenhydrAMINE (BENADRYL) injection 50 mg   And   LORazepam  (ATIVAN ) injection 2 mg   thiamine (VITAMIN B1) tablet 100 mg   haloperidol (HALDOL) tablet 5 mg   And   diphenhydrAMINE (BENADRYL) capsule 50 mg   haloperidol lactate (HALDOL) injection 5 mg   And   diphenhydrAMINE (BENADRYL) injection 50 mg    And   LORazepam  (ATIVAN ) injection 2 mg   haloperidol lactate (HALDOL) injection 10 mg   And   diphenhydrAMINE (BENADRYL) injection  50 mg   And   LORazepam  (ATIVAN ) injection 2 mg   nicotine  polacrilex (NICORETTE) gum 2 mg   hydrOXYzine  (ATARAX ) tablet 50 mg   [COMPLETED] prazosin (MINIPRESS) capsule 1 mg   loperamide  (IMODIUM ) capsule 2 mg   [COMPLETED] naproxen  (NAPROSYN ) tablet 500 mg   gabapentin  (NEURONTIN ) capsule 200 mg       05/17/2024    9:02 AM 05/15/2024   11:40 AM 05/13/2024   12:41 PM  Depression screen PHQ 2/9  Decreased Interest 2 1 1   Down, Depressed, Hopeless 2 0 1  PHQ - 2 Score 4 1 2   Altered sleeping 3 2 1   Tired, decreased energy 2 0 1  Change in appetite 3 0 1  Feeling bad or failure about yourself  3 0 0  Trouble concentrating 3 0 0  Moving slowly or fidgety/restless 1 0 1  Suicidal thoughts 0 0 0  PHQ-9 Score 19 3 6   Difficult doing work/chores  Somewhat difficult     Flowsheet Row ED from 05/12/2024 in [facility] ED from 05/11/2024 in [facility] ED from 05/10/2024 in [facility] Emergency Department at [facility]  C-SSRS RISK CATEGORY No Risk No Risk No Risk    Musculoskeletal  Strength & Muscle Tone: within normal limits Gait & Station: normal Patient leans: N/A  Psychiatric Specialty Exam  Presentation  General Appearance:  Appropriate for Environment  Eye Contact: Good  Speech: Clear and Coherent  Speech Volume: Normal  Handedness: Right   Mood and Affect  Mood: Euthymic  Affect: Appropriate   Thought Process  Thought Processes: Coherent  Descriptions of Associations:Intact  Orientation:Full (Time, Place and Person)  Thought Content:Logical  Diagnosis of Schizophrenia or Schizoaffective disorder in past: No    Hallucinations:Hallucinations: None  Ideas of Reference:None  Suicidal Thoughts:Suicidal Thoughts: No  Homicidal  Thoughts:Homicidal Thoughts: No   Sensorium  Memory: Immediate Fair  Judgment: Good  Insight: Good   Executive Functions  Concentration: Good  Attention Span: Good  Recall: Good  Fund of Knowledge: Good  Language: Good   Psychomotor Activity  Psychomotor Activity: Psychomotor Activity: Normal   Assets  Assets: Communication Skills; Desire for Improvement   Sleep  Sleep: Sleep: Good  Estimated Sleeping Duration (Last 24 Hours): 7.00-8.25 hours (Due to Daylight Saving Time, the durations displayed may not accurately represent documentation during the time change interval)  Nutritional Assessment (For OBS and [facility] admissions only) Has the patient had a weight loss or gain of 10 pounds or more in the last 3 months?: No Has the patient had a decrease in food intake/or appetite?: No Does the patient have dental problems?: No Does the patient have eating habits or behaviors that may be indicators of an eating disorder including binging or inducing vomiting?: No Has the patient recently lost weight without trying?: 0    Physical Exam  Physical Exam ROS Blood pressure 114/68, pulse 85, temperature 98 F (36.7 C), temperature source Oral, resp. rate 16, SpO2 98%. There is no height or weight on file to calculate BMI.  Demographic Factors:  Male, Caucasian, Low socioeconomic status, and Unemployed  Loss Factors: Legal issues and Financial problems/change in socioeconomic status  Historical Factors: Family history of mental illness or substance abuse and Impulsivity  Risk Reduction Factors:   NA  Continued Clinical Symptoms:  Alcohol/Substance Abuse/Dependencies  Cognitive Features That Contribute To Risk:  None    Suicide Risk:  Mild:  Suicidal ideation of limited  frequency, intensity, duration, and specificity.  There are no identifiable plans, no associated intent, mild dysphoria and related symptoms, good self-control (both objective and  subjective assessment), few other risk factors, and identifiable protective factors, including available and accessible social support.  Plan Of Care/Follow-up recommendations:  Activity: Daily physical activity; reduce sedentary behaviors   Diet: Portion-limited diet rich in produce, whole (minimally processed) grains, nuts/seeds, eggs, beans/legumes, seafood, lowfat dairy (if tolerated), fermented food, lean meat. Copious water intake. Limit (ultra)processed foods and sugar-sweetened beverages.    Other: -Follow-up with your outpatient psychiatric provider -instructions on appointment date, time, and address (location) are provided to you in discharge paperwork. [facility].   -Take your psychiatric medications as prescribed at discharge - instructions are provided to you in the discharge paperwork   -Recommend total abstinence from alcohol, tobacco, and other illicit drug use at discharge.    -If your psychiatric symptoms recur, worsen, or if you have side effects to your psychiatric medications, call your outpatient psychiatric provider, 911, [phone] or go to the nearest emergency department.   -If suicidal thoughts occur, immediately call your outpatient psychiatric provider, 911, [phone] or go to the nearest emergency department.  Disposition: Discharge to [facility] Relapse Prevention Program  KANDI JAYSON HAHN, MD 05/18/2024, 12:15 PM

## 2024-05-18 NOTE — Discharge Instructions (Signed)
 [facility] Care management...  Writer met with patient...   Patient scheduled for discharge 05/18/2024  Peer Support Specialist David Andrews will pick up patient @ 8:30 am and transport to [facility] in [place] for temporary housing.  Patient has been accepted to...  [facility] [address] [place], [place] [address]  [phone]  David Andrews (PSS) will transport parient to [facility] on 05/25/2024 for his scheduled appointment   Patient has been accepted to [facility] appointment scheduled for Monday 05/25/2024 @ 9:00 am.

## 2024-05-18 NOTE — Progress Notes (Signed)
 Patient is currently resting at this time with no acute distress.  Respirations present, even and unlabored.  Will continue Q 15 min safety checks for safety/behavior.

## 2024-05-18 NOTE — Care Management (Signed)
 [facility] Care Management...  Writer met with patient...   Patient scheduled for discharge 05/18/2024  Peer Support Specialist Harlene will pick up patient and transport to [facility] in [place] for housing.  Patient has been accepted to... Appointment scheduled for 05/25/2024   [facility] [address] [place], [place] [address]  [phone]  Jessica (PSS) will transport parient to [facility] on 05/25/2024 for his scheduled appointment   Patient has been accepted to [facility] appointment scheduled for Monday 05/25/2024 @ 9:00 am.   Clinical Research Associate provided patient with resource list of shelters until he is admitted to [facility].   Writer coordinated meeting with Harlene (PSS) to continue working with patient after discharge to obtain community resources, housing and employment.

## 2024-05-18 NOTE — ED Notes (Signed)
 Pt ate breakfast. Pt denies si hi and avh- verbal contract for safety provided. Pt reports some anxiety, but readiness for discharge. Pt reports chronic back pain, decline intervention. Pt administered scheduled medications. Discharge paperwork reviewed including follow up care and AVS.

## 2024-05-22 ENCOUNTER — Other Ambulatory Visit: Payer: Self-pay

## 2024-05-22 MED ORDER — GABAPENTIN 300 MG PO CAPS
300.0000 mg | ORAL_CAPSULE | Freq: Two times a day (BID) | ORAL | 0 refills | Status: AC
  Filled 2024-05-22: qty 60, 30d supply, fill #0

## 2024-05-22 MED ORDER — HYDROXYZINE HCL 50 MG PO TABS
50.0000 mg | ORAL_TABLET | Freq: Three times a day (TID) | ORAL | 0 refills | Status: AC | PRN
  Filled 2024-05-22: qty 30, 10d supply, fill #0

## 2024-05-22 MED ORDER — TRAZODONE HCL 50 MG PO TABS
50.0000 mg | ORAL_TABLET | Freq: Every evening | ORAL | 0 refills | Status: AC | PRN
  Filled 2024-05-22: qty 30, 30d supply, fill #0

## 2024-06-13 ENCOUNTER — Emergency Department (HOSPITAL_BASED_OUTPATIENT_CLINIC_OR_DEPARTMENT_OTHER)
Admission: EM | Admit: 2024-06-13 | Discharge: 2024-06-14 | Disposition: A | Discharge status: Home / Self Care | Payer: MEDICAID | Attending: Emergency Medicine | Admitting: Emergency Medicine

## 2024-06-13 ENCOUNTER — Encounter (HOSPITAL_BASED_OUTPATIENT_CLINIC_OR_DEPARTMENT_OTHER): Payer: Self-pay | Admitting: Emergency Medicine

## 2024-06-13 ENCOUNTER — Emergency Department (HOSPITAL_BASED_OUTPATIENT_CLINIC_OR_DEPARTMENT_OTHER): Payer: MEDICAID

## 2024-06-13 DIAGNOSIS — K625 Hemorrhage of anus and rectum: Secondary | ICD-10-CM

## 2024-06-13 DIAGNOSIS — K5900 Constipation, unspecified: Secondary | ICD-10-CM

## 2024-06-13 DIAGNOSIS — R140 Abdominal distension (gaseous): Secondary | ICD-10-CM

## 2024-06-13 LAB — CBC WITH DIFFERENTIAL/PLATELET
Abs Immature Granulocytes: 0.13 K/uL — ABNORMAL HIGH (ref 0.00–0.07)
Basophils Absolute: 0.1 K/uL (ref 0.0–0.1)
Basophils Relative: 1 %
Eosinophils Absolute: 0.4 K/uL (ref 0.0–0.5)
Eosinophils Relative: 6 %
HCT: 37.6 % — ABNORMAL LOW (ref 39.0–52.0)
Hemoglobin: 12.5 g/dL — ABNORMAL LOW (ref 13.0–17.0)
Immature Granulocytes: 2 %
Lymphocytes Relative: 28 %
Lymphs Abs: 1.8 K/uL (ref 0.7–4.0)
MCH: 29.6 pg (ref 26.0–34.0)
MCHC: 33.2 g/dL (ref 30.0–36.0)
MCV: 88.9 fL (ref 80.0–100.0)
Monocytes Absolute: 0.6 K/uL (ref 0.1–1.0)
Monocytes Relative: 10 %
Neutro Abs: 3.4 K/uL (ref 1.7–7.7)
Neutrophils Relative %: 53 %
Platelets: 225 K/uL (ref 150–400)
RBC: 4.23 MIL/uL (ref 4.22–5.81)
RDW: 13.3 % (ref 11.5–15.5)
Smear Review: NORMAL
WBC: 6.5 K/uL (ref 4.0–10.5)
nRBC: 0 % (ref 0.0–0.2)

## 2024-06-13 LAB — COMPREHENSIVE METABOLIC PANEL WITH GFR
ALT: 93 U/L — ABNORMAL HIGH (ref 0–44)
AST: 56 U/L — ABNORMAL HIGH (ref 15–41)
Albumin: 4.1 g/dL (ref 3.5–5.0)
Alkaline Phosphatase: 107 U/L (ref 38–126)
Anion gap: 11 (ref 5–15)
BUN: 13 mg/dL (ref 6–20)
CO2: 28 mmol/L (ref 22–32)
Calcium: 9.3 mg/dL (ref 8.9–10.3)
Chloride: 100 mmol/L (ref 98–111)
Creatinine, Ser: 1.04 mg/dL (ref 0.61–1.24)
GFR, Estimated: >60 mL/min (ref >60)
Glucose, Bld: 140 mg/dL — ABNORMAL HIGH (ref 70–99)
Potassium: 4 mmol/L (ref 3.5–5.1)
Sodium: 139 mmol/L (ref 135–145)
Total Bilirubin: 0.4 mg/dL (ref 0.0–1.2)
Total Protein: 6.9 g/dL (ref 6.5–8.1)

## 2024-06-13 LAB — OCCULT BLOOD X 1 CARD TO LAB, STOOL: Fecal Occult Bld: POSITIVE — AB

## 2024-06-13 LAB — LIPASE, BLOOD: Lipase: 14 U/L (ref 11–51)

## 2024-06-13 MED ORDER — IOHEXOL 300 MG/ML  SOLN
100.0000 mL | Freq: Once | INTRAMUSCULAR | Status: AC | PRN
  Administered 2024-06-13: 100 mL via INTRAVENOUS
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
## 2024-06-13 NOTE — ED Notes (Signed)
 Patient reports genralized rash over entire body that showed up a couple days ago.  Is currently in rehab and reports a lot of night terrors and night sweats and thinks it could be from that.
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
## 2024-06-14 MED ORDER — PANTOPRAZOLE SODIUM 40 MG IV SOLR
40.0000 mg | Freq: Once | INTRAVENOUS | Status: AC
  Administered 2024-06-14: 40 mg via INTRAVENOUS
  Filled 2024-06-14: qty 10

## 2024-06-14 MED ORDER — SODIUM CHLORIDE 0.9 % IV BOLUS
1000.0000 mL | Freq: Once | INTRAVENOUS | Status: AC
  Administered 2024-06-14: 1000 mL via INTRAVENOUS

## 2024-06-14 MED ORDER — PANTOPRAZOLE SODIUM 20 MG PO TBEC: 20.0000 mg | DELAYED_RELEASE_TABLET | Freq: Every day | ORAL | 0 refills | Status: DC
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
## 2024-06-14 NOTE — Discharge Instructions (Addendum)
 Begin taking Protonix  as prescribed.  Stay hydrated and drink plenty of clear fluids over the next few days.  Please call GI on Monday to schedule follow-up appointment for bleeding from the rectum.  Return to ED if any symptoms worsen including severe uncontrolled abdominal pain, worsening severe bleeding from the rectal area, severe weakness/syncopal episode.

## 2024-06-15 ENCOUNTER — Ambulatory Visit: Payer: MEDICAID | Admitting: Physician Assistant

## 2024-06-15 ENCOUNTER — Encounter: Payer: Self-pay | Admitting: Physician Assistant

## 2024-06-15 VITALS — BP 117/74 | HR 71 | Ht 71.0 in | Wt 187.0 lb

## 2024-06-15 DIAGNOSIS — R739 Hyperglycemia, unspecified: Secondary | ICD-10-CM

## 2024-06-15 DIAGNOSIS — K625 Hemorrhage of anus and rectum: Secondary | ICD-10-CM

## 2024-06-15 DIAGNOSIS — L309 Dermatitis, unspecified: Secondary | ICD-10-CM

## 2024-06-15 DIAGNOSIS — R748 Abnormal levels of other serum enzymes: Secondary | ICD-10-CM

## 2024-06-15 DIAGNOSIS — F1010 Alcohol abuse, uncomplicated: Secondary | ICD-10-CM

## 2024-06-15 DIAGNOSIS — F411 Generalized anxiety disorder: Secondary | ICD-10-CM

## 2024-06-15 DIAGNOSIS — B182 Chronic viral hepatitis C: Secondary | ICD-10-CM

## 2024-06-15 LAB — POCT GLYCOSYLATED HEMOGLOBIN (HGB A1C): HbA1c, POC (prediabetic range): 5.1 % — AB (ref 5.7–6.4)

## 2024-06-15 MED ORDER — PANTOPRAZOLE SODIUM 20 MG PO TBEC: 20.0000 mg | DELAYED_RELEASE_TABLET | Freq: Every day | ORAL | 0 refills | Status: DC | PRN

## 2024-06-15 MED ORDER — TRIAMCINOLONE ACETONIDE 0.1 % EX CREA: 1.0000 | TOPICAL_CREAM | Freq: Two times a day (BID) | CUTANEOUS | 0 refills | Status: AC

## 2024-06-15 NOTE — Progress Notes (Unsigned)
 Established Patient Office Visit  Subjective   Patient ID: David Andrews, male    DOB: 1991-06-07  Age: 33 y.o. MRN: [id number]  Chief Complaint  Patient presents with   Rash    He has a rash on both his arms/chest/ and legs for about a week and half   Rectal Bleeding  Discussed the use of AI scribe software for clinical note transcription with the patient, who gave verbal consent to proceed.  History of Present Illness   Ele lft  Ele bg    HPI  Past Medical History:  Diagnosis Date   DDD (degenerative disc disease), lumbar    Gastric ulcer    Herniated disc    Larynx cancer (HCC)    Social History   Socioeconomic History   Marital status: Single    Spouse name: Not on file   Number of children: Not on file   Years of education: Not on file   Highest education level: Not on file  Occupational History   Not on file  Tobacco Use   Smoking status: Every Day    Current packs/day: 1.00    Types: Cigarettes   Smokeless tobacco: Never  Vaping Use   Vaping status: Never Used  Substance and Sexual Activity   Alcohol use: No   Drug use: Yes    Types: Marijuana, Crack cocaine    Comment: Smokes cannabis occasionally; started snorting coke at age 34 when his father introduced him to it. 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   Sexual activity: Yes  Other Topics Concern   Not on file  Social History Narrative   Not on file   Social Drivers of Health   Tobacco Use: High Risk (06/15/2024)   Patient History    Smoking Tobacco Use: Every Day    Smokeless Tobacco Use: Never    Passive Exposure: Not on file  Financial Resource Strain: Not on file  Food Insecurity: Food Insecurity Present (05/12/2024)   Epic    Worried About Programme Researcher, Broadcasting/film/video in the Last Year: Sometimes true    [employer] of Food in the Last Year: Sometimes true  Transportation Needs: No Transportation  Needs (05/12/2024)   Epic    Lack of Transportation (Medical): No    Lack of Transportation (Non-Medical): No  Physical Activity: Not on file  Stress: Not on file  Social Connections: Not on file  Intimate Partner Violence: Not At Risk (05/12/2024)   Epic    Fear of Current or Ex-Partner: No    Emotionally Abused: No    Physically Abused: No    Sexually Abused: No  Depression (PHQ2-9): High Risk (05/17/2024)   Depression (PHQ2-9)    PHQ-2 Score: 19  Alcohol Screen: Not on file  Housing: Not on file  Utilities: Not At Risk (05/12/2024)   Epic    Threatened with loss of utilities: No  Health Literacy: Not on file   Family History  Problem Relation Age of Onset   Drug abuse Father    Allergies[1]  ROS    Objective:     BP 117/74 (BP Location: Left Arm, Patient Position: Sitting)   Pulse 71   Ht 5' 11 (1.803 m)   Wt 187 lb (84.8 kg)   SpO2 98%   BMI 26.08 kg/m  BP Readings from Last 3 Encounters:  06/15/24 117/74  06/13/24 121/85  05/11/24 121/76   Wt Readings from Last 3 Encounters:  06/15/24 187 lb (84.8 kg)  06/13/24 170 lb (77.1 kg)  05/10/24 150 lb (68 kg)    Physical Exam   No results found for any visits on 06/15/24.  {Labs (Optional):23779}  The ASCVD Risk score (Arnett DK, et al., 2019) failed to calculate for the following reasons:   The 2019 ASCVD risk score is only valid for ages 77 to 46    Assessment & Plan:   Problem List Items Addressed This Visit   None Visit Diagnoses       Elevated blood sugar    -  Primary   Relevant Orders   HgB A1c       No follow-ups on file.    Kimo Bancroft S Mayers, PA-C     [1]  Allergies Allergen Reactions   Mustard Hives   Wellbutrin [Bupropion] Swelling and Other (See Comments)    makes him crazy

## 2024-06-15 NOTE — Patient Instructions (Signed)
 VISIT SUMMARY:  During today's visit, we discussed your recent rash, rectal bleeding, and other health concerns. We reviewed your symptoms, past medical history, and current medications. A treatment plan was created to address each of your issues, including prescriptions and referrals to specialists.  YOUR PLAN:  -DERMATITIS: Dermatitis is a condition that causes a rash with small bumps on the skin. It may be due to sweating or an allergic reaction to detergent. You have been prescribed a steroid cream to apply to the affected areas. If the cream does not help, a steroid injection may be considered.  -RECTAL BLEEDING: Rectal bleeding can be caused by various conditions such as hemorrhoids or diverticula. You have been experiencing dark red blood with bowel movements. You are advised to start taking Protonix  daily and have been referred to a gastroenterologist for further evaluation, including an upper endoscopy and colonoscopy. It is important to maintain soft stools to reduce strain.  -CHRONIC HEPATITIS C: Chronic hepatitis C is a liver infection caused by the hepatitis C virus, which can lead to liver damage over time. We discussed a potential 12-week treatment regimen, and you have been referred to an infectious disease specialist for evaluation and possible treatment initiation.  -ELEVATED BLOOD SUGAR: Elevated blood sugar was noted during your recent ER visit, but your overall blood sugar levels over the last 90 days are normal. It is important to stay hydrated and monitor for constipation.  INSTRUCTIONS:  Please follow up with the gastroenterologist for your upper endoscopy and colonoscopy. Additionally, schedule an appointment with the infectious disease specialist to discuss treatment for hepatitis C. Continue using the steroid cream as prescribed and start taking Protonix  daily. Ensure you stay hydrated and maintain soft stools.

## 2024-06-16 ENCOUNTER — Encounter: Payer: Self-pay | Admitting: Physician Assistant

## 2024-08-05 ENCOUNTER — Encounter: Payer: Self-pay | Admitting: Physician Assistant

## 2024-08-05 ENCOUNTER — Ambulatory Visit: Payer: MEDICAID | Admitting: Physician Assistant

## 2024-08-05 VITALS — BP 118/86 | HR 88 | Ht 71.0 in | Wt 196.0 lb

## 2024-08-05 DIAGNOSIS — F411 Generalized anxiety disorder: Secondary | ICD-10-CM | POA: Diagnosis not present

## 2024-08-05 DIAGNOSIS — K219 Gastro-esophageal reflux disease without esophagitis: Secondary | ICD-10-CM | POA: Diagnosis not present

## 2024-08-05 DIAGNOSIS — K625 Hemorrhage of anus and rectum: Secondary | ICD-10-CM | POA: Diagnosis not present

## 2024-08-05 MED ORDER — PANTOPRAZOLE SODIUM 40 MG PO TBEC: 40.0000 mg | DELAYED_RELEASE_TABLET | Freq: Every day | ORAL | 1 refills | Status: AC | PRN

## 2024-08-05 MED ORDER — CLONIDINE HCL 0.1 MG PO TABS: 0.1000 mg | ORAL_TABLET | Freq: Three times a day (TID) | ORAL | 1 refills | Status: AC | PRN

## 2024-08-05 MED ORDER — DOCUSATE SODIUM 250 MG PO CAPS: 250.0000 mg | ORAL_CAPSULE | Freq: Every day | ORAL | 0 refills | Status: AC

## 2024-08-05 MED ORDER — GABAPENTIN 400 MG PO CAPS: 400.0000 mg | ORAL_CAPSULE | Freq: Three times a day (TID) | ORAL | 1 refills | Status: AC

## 2024-08-05 NOTE — Patient Instructions (Addendum)
 VISIT SUMMARY:  During your visit, we discussed your ongoing medication management and concerns about rectal bleeding. We addressed your anxiety, rectal bleeding, and heartburn, and made adjustments to your medications and lifestyle recommendations to help manage these issues.  YOUR PLAN:  -GENERALIZED ANXIETY DISORDER: Generalized anxiety disorder is a condition characterized by excessive worry and anxiety about various aspects of life. We have increased your gabapentin  dosage to 400 mg three times a day and changed your clonidine  to as needed, with a plan to gradually reduce and discontinue it. Please check your blood pressure at local facilities and follow up in a couple of weeks to assess your response.  -RECTAL BLEEDING DUE TO INTERNAL HEMORRHOIDS: Internal hemorrhoids are swollen blood vessels in the rectum that can cause bright red bleeding. We have prescribed a stool softener for daily use, advised you to increase your water intake, and instructed you to avoid straining during bowel movements.  -GASTROESOPHAGEAL REFLUX DISEASE (GERD): GERD is a condition where stomach acid frequently flows back into the tube connecting your mouth and stomach, causing heartburn. We have increased your Protonix  dosage to 40 mg once daily in the morning. Please avoid lying down within three hours of eating, continue to stay hydrated, and make dietary modifications to reduce acid production.    GERD in Adults: Diet Changes When you have gastroesophageal reflux disease (GERD), you may need to make changes to your diet. Choosing the right foods can help with your symptoms. Think about working with an expert in healthy eating called a dietitian. They can help you make healthy food choices. What are tips for following this plan? Reading food labels Look for foods that are low in saturated fat. Foods that may help with your symptoms include: Foods with less than 5% of daily value (DV) of fat. Foods with 0 grams  of trans fat. Cooking [employer] in ways that don't use a lot of fat. These ways include: Baking. Steaming. Grilling. Broiling. To add flavor, try to use herbs that are low in spice and acidity. Avoid frying your food. Meal planning  Eat small meals often rather than eating 3 large meals each day. Eat your meals slowly in a place where you feel relaxed. If told by your health care provider, avoid: Foods that cause symptoms. Keep a food diary to keep track of foods that cause symptoms. Alcohol. Drinking a lot of liquid with meals. General instructions For 2-3 hours after you eat, avoid: Bending over. Exercise. Lying down. Chew sugar-free gum after meals. What foods should I eat? Eat a healthy diet. Try to include: Foods with high amounts of fiber. These include: Fruits and vegetables. Whole grains and beans. Low-fat dairy products. Lean meats, fish, and poultry. Egg whites. Foods that cause symptoms in someone else may not cause symptoms for you. Work with your provider to find foods that are safe for you. The items listed above may not be all the foods and drinks you can have. Talk with a dietitian to learn more. The items listed above may not be a complete list of foods and beverages you can eat and drink. Contact a dietitian for more information. What foods should I avoid? Limiting some of these foods may help with your symptoms. Each person is different. Talk with a dietitian or your provider to help you find the exact foods to avoid. Some of the foods to avoid may include: Fruits Fruits with a lot of acid in them. These may include citrus fruits, such  as oranges, grapefruit, pineapple, and lemons. Vegetables Deep-fried vegetables, such as French fries. Vegetables, sauces, or toppings made with added fat and vegetables with acid in them. These may include tomatoes and tomato products, chili peppers, onions, garlic, and horseradish. Grains Pastries or quick breads with  added fat. Meats and other proteins High-fat meats, such as fatty beef or pork, hot dogs, ribs, ham, sausage, salami, and bacon. Fried meat or protein, such as fried fish and fried chicken. Egg yolks. Fats and oils Butter. Margarine. Shortening. Ghee. Drinks Coffee and other drinks with caffeine in them. Fizzy and sugary drinks, such as soda and energy drinks. Fruit juice made with acidic fruits, such as orange or grapefruit. Tomato juice. Sweets and desserts Chocolate and cocoa. Donuts. Seasonings and condiments Mint, such as peppermint and spearmint. Condiments, herbs, or seasonings that cause symptoms. These may include curry, hot sauce, or vinegar-based salad dressings. The items listed above may not be all the foods and drinks you should avoid. Talk with a dietitian to learn more. Questions to ask your health care provider Changes to your diet and everyday life are often the first steps taken to manage symptoms of GERD. If these changes don't help, talk with your provider about taking medicines. Where to find more information [employer]: aboutgerd.org This information is not intended to replace advice given to you by your health care provider. Make sure you discuss any questions you have with your health care provider. Document Revised: 04/30/2023 Document Reviewed: 11/14/2022 Elsevier Patient Education  2024 [employer].  How to Check Your Blood Pressure Your blood pressure measures how strongly your blood pushes against the walls of your arteries. Arteries are blood vessels that carry blood from your heart to your body. Your health care provider will check your blood pressure at office visits. You may need to take your blood pressure at home: If you have high blood pressure. This is also called hypertension. To watch your blood pressure over time. To make sure your blood pressure medicines are working. Supplies needed: A blood pressure  machine called a monitor. A chair to sit in. This should be a chair where you can sit upright with your back supported. Do not sit on a soft couch or an armchair. Table or desk. Small notebook. Pencil or pen. How to prepare For 30 minutes before you check your blood pressure, do not: Drink caffeine. Drink alcohol. Smoke. Exercise. Around 5 minutes before you check your blood pressure: Go to the bathroom and pee. Sit quietly in a chair. Do not talk. How to take your blood pressure Follow the instructions in the manual that came with your blood pressure monitor. It may say to: Sit up straight. Place your feet flat on the floor. Do not cross your ankles or legs. Rest your left arm at the level of your heart. You may rest it on a table, desk, or chair. Pull up your shirt sleeve. It's best to wrap the cuff around bare skin. Wrap the blood pressure cuff around the upper part of your left arm. The cuff should be 1 inch (2.5 cm) above your elbow. Fit the cuff snugly around your arm but not too tightly. You should be able to place only one finger between the cuff and your arm. Place the cord so that it rests in the bend of your elbow. Press the power button. Sit quietly while the cuff fills with air and loses air. Write down the numbers on the screen. Wait  1-2 minutes, then repeat the steps, starting at step 1. What do the numbers mean? Your blood pressure is made up of 2 numbers. The first number is called systolic pressure. The second is called diastolic pressure. Your blood pressure is measured in a unit called millimeters of mercury (mm Hg). A healthy blood pressure should be below 120/80 mm Hg. If you're an adult and do not have a serious illness that's causing high blood pressure, use this guide to find out if your blood pressure is normal: Normal First number: below 120. Second number: below 80. Elevated First number: 120-129. Second number: below 80. Hypertension stage 1 First  number: 130-139. Second number: 80-89. Hypertension stage 2 First number: 140 or above. Second number: 90 or above. Your blood pressure is above normal even if only the first or second number is above normal. Follow these instructions at home: Medicines Take your medicines only as told. General instructions Check your blood pressure as often as you're told. Check your blood pressure at the same time every day. Take your monitor to your next appointment with your provider. Your provider will: Make sure you're using it correctly. Make sure it's working right. Keep all follow-up visits. Your provider will go over your blood pressure readings with you. General tips You can find a blood pressure monitor at a drugstore or online. Your provider can also suggest one for you. When choosing one: Pick one with an arm cuff. Pick one that wraps around your upper arm. Only one finger should fit between your arm and the cuff. Do not choose one that measures your blood pressure from your wrist or finger. Where to find more information To learn more: Go to thisjobs.cz. Click Search. Type home blood pressure monitoring in the search field and find the link you need. Contact a health care provider if: Your blood pressure keeps being high. Your blood pressure is low all of a sudden. You have side effects from your blood pressure medicine. Get help right away if: Your first blood pressure number is higher than 180. Your second blood pressure number is higher than 120. These symptoms may be an emergency. Call 911 right away. Do not wait to see if the symptoms will go away. Do not drive yourself to the hospital. This information is not intended to replace advice given to you by your health care provider. Make sure you discuss any questions you have with your health care provider. Document Revised: 11/11/2023 Document Reviewed: 11/11/2023 Elsevier Patient Education  2025 [employer].

## 2024-08-05 NOTE — Progress Notes (Signed)
 "  Established Patient Office Visit  Subjective   Patient ID: David Andrews, male    DOB: July 24, 1990  Age: 34 y.o. MRN: [id number]  Chief Complaint  Patient presents with   Medication Refill   Discussed the use of AI scribe software for clinical note transcription with the patient, who gave verbal consent to proceed.  History of Present Illness   David Andrews is a 34 year old male who presents with medication management and rectal bleeding.  He left [facility] about four weeks ago and is staying at [facility], a sober living home. He is transitioning to a new primary care provider and needs ongoing medication management. He received a bridge prescription but only a limited supply of gabapentin  300 mg three times daily for anxiety. He is running low and wants to increase the dose. His pharmacy is open on limited days, which makes timely refills difficult.  He takes clonidine  0.1 mg three times daily for anxiety and worries he may not receive this regularly if he goes to prison, where it is usually limited to detox. Missing doses makes him feel unwell, which he attributes to possible high blood pressure and/or anxiety. He has gained about 60 pounds in the last two months and acknowledges poor dietary habits, and he is concerned this may be affecting his blood pressure.  He has had bright red rectal bleeding for about three months, seen in the toilet and on tissue, with no significant pain. He was evaluated at a hospital and started on Protonix  for heartburn, which he takes daily. He still has heartburn, worse when lying down. He does not use NSAIDs because of cost. He drinks plenty of water and has used laxatives for occasional constipation. There is no significant pain associated with the rectal bleeding.     08/05/2024    2:07 PM 05/17/2024    9:02 AM 05/15/2024   11:40 AM 05/13/2024   12:41 PM 05/12/2024   10:26 AM  Depression screen PHQ 2/9  Decreased Interest 1      Down, Depressed,  Hopeless 1      PHQ - 2 Score 2      Altered sleeping 1      Tired, decreased energy 1      Change in appetite 1      Feeling bad or failure about yourself  2      Trouble concentrating 2      Moving slowly or fidgety/restless 1      Suicidal thoughts 1      PHQ-9 Score 11      Difficult doing work/chores Somewhat difficult         Information is confidential and restricted. Go to Review Flowsheets to unlock data.      08/05/2024    2:07 PM  GAD 7 : Generalized Anxiety Score  Nervous, Anxious, on Edge 2  Control/stop worrying 3  Worry too much - different things 2  Trouble relaxing 2  Restless 2  Easily annoyed or irritable 2  Afraid - awful might happen 1  Total GAD 7 Score 14  Anxiety Difficulty Somewhat difficult    Patient states fleeting thoughts that he would be better off dead, denies any thoughts of self harm   Past Medical History:  Diagnosis Date   DDD (degenerative disc disease), lumbar    Gastric ulcer    Herniated disc    Larynx cancer (HCC)    Social History   Socioeconomic History   Marital status:  Single    Spouse name: Not on file   Number of children: Not on file   Years of education: Not on file   Highest education level: Not on file  Occupational History   Not on file  Tobacco Use   Smoking status: Every Day    Current packs/day: 1.00    Types: Cigarettes   Smokeless tobacco: Never  Vaping Use   Vaping status: Never Used  Substance and Sexual Activity   Alcohol use: No   Drug use: Yes    Types: Marijuana, Crack cocaine    Comment: Smokes cannabis occasionally; 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   Sexual activity: Yes  Other Topics Concern   Not on file  Social History Narrative   Not on file   Social Drivers of Health   Tobacco Use: High Risk (08/05/2024)   Patient  History    Smoking Tobacco Use: Every Day    Smokeless Tobacco Use: Never    Passive Exposure: Not on file  Financial Resource Strain: Not on file  Food Insecurity: Food Insecurity Present (08/05/2024)   Epic    Worried About Programme Researcher, Broadcasting/film/video in the Last Year: Often true    Barista in the Last Year: Often true  Transportation Needs: Unmet Transportation Needs (08/05/2024)   Epic    Lack of Transportation (Medical): Yes    Lack of Transportation (Non-Medical): Yes  Physical Activity: Not on file  Stress: Not on file  Social Connections: Not on file  Intimate Partner Violence: Not At Risk (08/05/2024)   Epic    Fear of Current or Ex-Partner: No    Emotionally Abused: No    Physically Abused: No    Sexually Abused: No  Depression (PHQ2-9): High Risk (08/05/2024)   Depression (PHQ2-9)    PHQ-2 Score: 11  Alcohol Screen: Not on file  Housing: Low Risk (08/05/2024)   Epic    Unable to Pay for Housing in the Last Year: No    Number of Times Moved in the Last Year: 1    Homeless in the Last Year: No  Utilities: Not At Risk (08/05/2024)   Epic    Threatened with loss of utilities: No  Health Literacy: Not on file   Family History  Problem Relation Age of Onset   Drug abuse Father    Allergies[1]  Review of Systems  Constitutional: Negative.   HENT: Negative.    Eyes: Negative.   Respiratory:  Negative for shortness of breath.   Cardiovascular:  Negative for chest pain.  Skin: Negative.       Objective:     BP 118/86 (BP Location: Left Arm, Patient Position: Sitting)   Pulse 88   Ht 5' 11 (1.803 m)   Wt 196 lb (88.9 kg)   SpO2 99%   BMI 27.34 kg/m  BP Readings from Last 3 Encounters:  08/05/24 118/86  06/15/24 117/74  06/13/24 121/85   Wt Readings from Last 3 Encounters:  08/05/24 196 lb (88.9 kg)  06/15/24 187 lb (84.8 kg)  06/13/24 170 lb (77.1 kg)    Physical Exam Vitals and nursing note reviewed.    GENERAL: Alert, cooperative, well developed, no  acute distress HEENT: Normocephalic, normal oropharynx, moist mucous membranes CHEST: Clear to auscultation bilaterally,  no wheezes, rhonchi, or crackles CARDIOVASCULAR: Normal heart rate and rhythm, S1 and S2 normal without murmurs EXTREMITIES: No cyanosis or edema NEUROLOGICAL: Cranial nerves grossly intact, moves all extremities without gross motor or sensory deficit   Assessment & Plan:   Problem List Items Addressed This Visit       Other   GAD (generalized anxiety disorder) - Primary   Relevant Medications   gabapentin  (NEURONTIN ) 400 MG capsule   cloNIDine  (CATAPRES ) 0.1 MG tablet   Other Visit Diagnoses       Rectal bleeding       Relevant Medications   docusate sodium  (COLACE) 250 MG capsule     Gastroesophageal reflux disease without esophagitis       Relevant Medications   docusate sodium  (COLACE) 250 MG capsule   pantoprazole  (PROTONIX ) 40 MG tablet      Assessment and Plan Generalized anxiety disorder Managed with gabapentin  and clonidine . Gabapentin  preferred for anxiety. Clonidine  used for anxiety and blood pressure. Concerns about clonidine  in potential incarceration. Gabapentin  dosage insufficient. - Increased gabapentin  to 400 mg three times a day. - Changed clonidine  to as needed, reducing to twice a day, then once a day, and eventually discontinuing. - Scheduled follow-up in a couple of weeks to assess response. - Advised checking blood pressure at local facilities.  Rectal bleeding due to internal hemorrhoids Rectal bleeding likely due to internal hemorrhoids. Bright red blood suggests lower GI source. Suboxone may contribute to constipation- Prescribed stool softener daily. - Advised increasing water intake. - Instructed to avoid straining during bowel movements. Trial ducosate  Gastroesophageal reflux disease GERD with significant heartburn, especially when lying down. Currently on Protonix , but symptoms persist. Symptoms may be exacerbated by  stress and dietary habits. - Increased Protonix  to 40 mg once daily in the morning. - Advised avoiding lying down within three hours of eating. - Encouraged continued hydration and dietary modifications to reduce acid production.   I have reviewed the patient's medical history (PMH, PSH, Social History, Family History, Medications, and allergies) , and have been updated if relevant. I spent 30 minutes reviewing chart and  face to face time with patient.    Return in about 2 weeks (around 08/19/2024) for With [facility].    Ataya Murdy S Mayers, PA-C     [1]  Allergies Allergen Reactions   Mustard Hives   Wellbutrin [Bupropion] Swelling and Other (See Comments)    makes him crazy   "
